# Patient Record
Sex: Female | Born: 1945 | Race: White | Hispanic: No | Smoking: Former smoker
Health system: Southern US, Community
[De-identification: ages and names within clinical notes are randomized; demographics above are authoritative.]

## PROBLEM LIST (undated history)

## (undated) DIAGNOSIS — C801 Malignant (primary) neoplasm, unspecified: Secondary | ICD-10-CM

## (undated) DIAGNOSIS — F0390 Unspecified dementia without behavioral disturbance: Secondary | ICD-10-CM

## (undated) DIAGNOSIS — M1990 Unspecified osteoarthritis, unspecified site: Secondary | ICD-10-CM

## (undated) DIAGNOSIS — J449 Chronic obstructive pulmonary disease, unspecified: Secondary | ICD-10-CM

## (undated) DIAGNOSIS — E785 Hyperlipidemia, unspecified: Secondary | ICD-10-CM

## (undated) DIAGNOSIS — I509 Heart failure, unspecified: Secondary | ICD-10-CM

## (undated) MED FILL — Dexamethasone Sodium Phosphate Inj 100 MG/10ML: INTRAMUSCULAR | Qty: 1 | Status: AC

---

## 2017-08-01 HISTORY — PX: EYE SURGERY: SHX253

## 2020-02-06 DIAGNOSIS — J449 Chronic obstructive pulmonary disease, unspecified: Secondary | ICD-10-CM | POA: Insufficient documentation

## 2020-02-06 DIAGNOSIS — E559 Vitamin D deficiency, unspecified: Secondary | ICD-10-CM | POA: Insufficient documentation

## 2020-02-06 DIAGNOSIS — K056 Periodontal disease, unspecified: Secondary | ICD-10-CM | POA: Insufficient documentation

## 2020-02-06 DIAGNOSIS — R221 Localized swelling, mass and lump, neck: Secondary | ICD-10-CM | POA: Insufficient documentation

## 2020-02-06 DIAGNOSIS — M170 Bilateral primary osteoarthritis of knee: Secondary | ICD-10-CM | POA: Insufficient documentation

## 2020-02-06 DIAGNOSIS — E785 Hyperlipidemia, unspecified: Secondary | ICD-10-CM | POA: Insufficient documentation

## 2020-02-06 DIAGNOSIS — M810 Age-related osteoporosis without current pathological fracture: Secondary | ICD-10-CM | POA: Insufficient documentation

## 2020-07-12 ENCOUNTER — Emergency Department: Payer: Medicare Other

## 2020-07-12 ENCOUNTER — Other Ambulatory Visit: Payer: Self-pay

## 2020-07-12 ENCOUNTER — Encounter: Payer: Self-pay | Admitting: Emergency Medicine

## 2020-07-12 ENCOUNTER — Emergency Department
Admission: EM | Admit: 2020-07-12 | Discharge: 2020-07-12 | Disposition: A | Discharge status: Home / Self Care | Payer: Medicare Other | Attending: Emergency Medicine | Admitting: Emergency Medicine

## 2020-07-12 DIAGNOSIS — J449 Chronic obstructive pulmonary disease, unspecified: Secondary | ICD-10-CM | POA: Insufficient documentation

## 2020-07-12 DIAGNOSIS — F0390 Unspecified dementia without behavioral disturbance: Secondary | ICD-10-CM | POA: Insufficient documentation

## 2020-07-12 DIAGNOSIS — I509 Heart failure, unspecified: Secondary | ICD-10-CM | POA: Insufficient documentation

## 2020-07-12 DIAGNOSIS — D72828 Other elevated white blood cell count: Secondary | ICD-10-CM | POA: Diagnosis not present

## 2020-07-12 DIAGNOSIS — Y92129 Unspecified place in nursing home as the place of occurrence of the external cause: Secondary | ICD-10-CM | POA: Diagnosis not present

## 2020-07-12 DIAGNOSIS — W19XXXA Unspecified fall, initial encounter: Secondary | ICD-10-CM | POA: Insufficient documentation

## 2020-07-12 DIAGNOSIS — R410 Disorientation, unspecified: Secondary | ICD-10-CM | POA: Diagnosis present

## 2020-07-12 HISTORY — DX: Chronic obstructive pulmonary disease, unspecified: J44.9

## 2020-07-12 HISTORY — DX: Unspecified dementia, unspecified severity, without behavioral disturbance, psychotic disturbance, mood disturbance, and anxiety: F03.90

## 2020-07-12 HISTORY — DX: Heart failure, unspecified: I50.9

## 2020-07-12 LAB — CBC WITH DIFFERENTIAL/PLATELET
Abs Immature Granulocytes: 0.23 10*3/uL — ABNORMAL HIGH (ref 0.00–0.07)
Basophils Absolute: 0.1 10*3/uL (ref 0.0–0.1)
Basophils Relative: 0 %
Eosinophils Absolute: 0 10*3/uL (ref 0.0–0.5)
Eosinophils Relative: 0 %
HCT: 48.2 % — ABNORMAL HIGH (ref 36.0–46.0)
Hemoglobin: 15.8 g/dL — ABNORMAL HIGH (ref 12.0–15.0)
Immature Granulocytes: 1 %
Lymphocytes Relative: 9 %
Lymphs Abs: 1.6 10*3/uL (ref 0.7–4.0)
MCH: 30.6 pg (ref 26.0–34.0)
MCHC: 32.8 g/dL (ref 30.0–36.0)
MCV: 93.2 fL (ref 80.0–100.0)
Monocytes Absolute: 1.6 10*3/uL — ABNORMAL HIGH (ref 0.1–1.0)
Monocytes Relative: 9 %
Neutro Abs: 14.5 10*3/uL — ABNORMAL HIGH (ref 1.7–7.7)
Neutrophils Relative %: 81 %
Platelets: 305 10*3/uL (ref 150–400)
RBC: 5.17 MIL/uL — ABNORMAL HIGH (ref 3.87–5.11)
RDW: 12.9 % (ref 11.5–15.5)
WBC: 18.1 10*3/uL — ABNORMAL HIGH (ref 4.0–10.5)
nRBC: 0 % (ref 0.0–0.2)

## 2020-07-12 LAB — URINALYSIS, COMPLETE (UACMP) WITH MICROSCOPIC
Bilirubin Urine: NEGATIVE
Glucose, UA: NEGATIVE mg/dL
Ketones, ur: NEGATIVE mg/dL
Leukocytes,Ua: NEGATIVE
Nitrite: NEGATIVE
Protein, ur: 100 mg/dL — AB
Specific Gravity, Urine: 1.011 (ref 1.005–1.030)
Squamous Epithelial / HPF: NONE SEEN (ref 0–5)
pH: 7 (ref 5.0–8.0)

## 2020-07-12 LAB — BASIC METABOLIC PANEL
Anion gap: 14 (ref 5–15)
BUN: 16 mg/dL (ref 8–23)
CO2: 27 mmol/L (ref 22–32)
Calcium: 11.5 mg/dL — ABNORMAL HIGH (ref 8.9–10.3)
Chloride: 98 mmol/L (ref 98–111)
Creatinine, Ser: 0.76 mg/dL (ref 0.44–1.00)
GFR, Estimated: >60 mL/min (ref >60)
Glucose, Bld: 115 mg/dL — ABNORMAL HIGH (ref 70–99)
Potassium: 3.8 mmol/L (ref 3.5–5.1)
Sodium: 139 mmol/L (ref 135–145)

## 2020-07-12 NOTE — ED Provider Notes (Signed)
[facility] Emergency Department Provider Note ____________________________________________   Event Date/Time   First MD Initiated Contact with Patient 07/12/20 1225     (approximate)  I have reviewed the triage vital signs and the nursing notes.  HISTORY  Chief Complaint Fall   HPI Nancy Berg is a 74 y.o. femalewho presents to the ED for evaluation of fall  Chart review indicates hx dementia, COPD and CHF. Patient resides at a local SNF. Patient presents with son, who is at the bedside, and provides additional history.  Patient had an unwitnessed fall that occurred at her SNF early this morning.  She was noted to ambulate back to her room after breakfast with her walker, and her roommate reportedly found her on the ground next to her walker near their room.  Patient is unable to provide any history due to her demented disoriented status.  Son at the bedside indicates he has no concerns for recent infections or illnesses before this fall.  He reports patient stayed with him for about 6 months prior to going to the SNF, which just happened last week.  No recent antibiotics.  She typically ambulates with a walker.  Past Medical History:  Diagnosis Date  . CHF (congestive heart failure) ([place])   . COPD (chronic obstructive pulmonary disease) ([place])   . Dementia ([place])     There are no problems to display for this patient.   History reviewed. No pertinent surgical history.  Prior to Admission medications   Not on File    Allergies Patient has no known allergies.  No family history on file.  Social History    Review of Systems  Unable to be reliably obtained due to patient's disoriented and demented mental status. ____________________________________________   PHYSICAL EXAM:  VITAL SIGNS: Vitals:   07/12/20 1409 07/12/20 1521  BP: (!) 193/90 (!) 179/75  Pulse: 84 88  Resp: 16 16  Temp: 98 F (36.7 C) 98.1 F (36.7 C)  SpO2:  97% 97%     Constitutional: Alert and pleasantly disoriented. Well appearing and in no acute distress.  Cachectic. Eyes: Conjunctivae are normal. PERRL. EOMI. Head: Atraumatic. Nose: No congestion/rhinnorhea. Mouth/Throat: Mucous membranes are moist.  Oropharynx non-erythematous. Neck: No stridor. No cervical spine tenderness to palpation. Cardiovascular: Normal rate, regular rhythm. Grossly normal heart sounds.  Good peripheral circulation. Respiratory: Normal respiratory effort.  No retractions. Lungs CTAB. Gastrointestinal: Soft , nondistended, nontender to palpation. No CVA tenderness. Musculoskeletal: No lower extremity tenderness nor edema.  No joint effusions. No signs of acute trauma. Inspection of the back reveals no signs of trauma.  No bony step-offs to the spine. Palpation of all 4 extremities without deformity, areas of tenderness or signs of trauma Neurologic:  Normal speech and language. No gross focal neurologic deficits are appreciated.  Cranial nerves II through XII intact 5/5 strength and sensation in all 4 extremities Skin:  Skin is warm, dry and intact. No rash noted. Psychiatric: Mood and affect are normal. Speech and behavior are normal.  ____________________________________________   LABS (all labs ordered are listed, but only abnormal results are displayed)  Labs Reviewed  CBC WITH DIFFERENTIAL/PLATELET - Abnormal; Notable for the following components:      Result Value   WBC 18.1 (*)    RBC 5.17 (*)    Hemoglobin 15.8 (*)    HCT 48.2 (*)    Neutro Abs 14.5 (*)    Monocytes Absolute 1.6 (*)    Abs Immature Granulocytes 0.23 (*)  All other components within normal limits  BASIC METABOLIC PANEL - Abnormal; Notable for the following components:   Glucose, Bld 115 (*)    Calcium 11.5 (*)    All other components within normal limits  URINALYSIS, COMPLETE (UACMP) WITH MICROSCOPIC - Abnormal; Notable for the following components:   Color, Urine YELLOW (*)     APPearance HAZY (*)    Hgb urine dipstick SMALL (*)    Protein, ur 100 (*)    Bacteria, UA RARE (*)    All other components within normal limits   ____________________________________________  RADIOLOGY  ED MD interpretation: CT head reviewed by me without evidence of acute cranial pathology. CT C-spine reviewed by me without evidence of cervical fracture.  Plain film of the chest reviewed by me with elevated left hemidiaphragm with associated mild atelectasis  Official radiology report(s): CT Head Wo Contrast  Result Date: 07/12/2020 CLINICAL DATA:  Unwitnessed witnessed fall.  Dementia EXAM: CT HEAD WITHOUT CONTRAST CT CERVICAL SPINE WITHOUT CONTRAST TECHNIQUE: Multidetector CT imaging of the head and cervical spine was performed following the standard protocol without intravenous contrast. Multiplanar CT image reconstructions of the cervical spine were also generated. COMPARISON:  None. FINDINGS: CT HEAD FINDINGS Brain: No acute intracranial hemorrhage. No focal mass lesion. No CT evidence of acute infarction. No midline shift or mass effect. No hydrocephalus. Basilar cisterns are patent. There are periventricular and subcortical white matter hypodensities. Generalized cortical atrophy. Vascular: No hyperdense vessel or unexpected calcification. Skull: Normal. Negative for fracture or focal lesion. Sinuses/Orbits: Paranasal sinuses and mastoid air cells are clear. Orbits are clear. Other: None. CT CERVICAL SPINE FINDINGS Alignment: Normal alignment of the cervical vertebral bodies. Skull base and vertebrae: Post cervical junction is normal. No acute loss vertebral height and disc height. No vertebral body fracture. 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 Soft tissues and spinal canal: No prevertebral soft tissue swelling. No perispinal or epidural hematoma. Disc levels: There is extensive endplate osteophytosis and joint space narrowing from C4-C7. There is facet fusion from C3-C5  bilaterally. Upper chest: Clear Other: None IMPRESSION: 1. No intracranial trauma. 2. Atrophy and white matter microvascular disease. 3. No cervical spine fracture. 4. Multilevel disc osteophytic disease and facet hypertrophy. No acute findings. Electronically Signed   By: Suzy Bouchard M.D.   On: 07/12/2020 10:25   CT Cervical Spine Wo Contrast  Result Date: 07/12/2020 CLINICAL DATA:  Unwitnessed witnessed fall.  Dementia EXAM: CT HEAD WITHOUT CONTRAST CT CERVICAL SPINE WITHOUT CONTRAST TECHNIQUE: Multidetector CT imaging of the head and cervical spine was performed following the standard protocol without intravenous contrast. Multiplanar CT image reconstructions of the cervical spine were also generated. COMPARISON:  None. FINDINGS: CT HEAD FINDINGS Brain: No acute intracranial hemorrhage. No focal mass lesion. No CT evidence of acute infarction. No midline shift or mass effect. No hydrocephalus. Basilar cisterns are patent. There are periventricular and subcortical white matter hypodensities. Generalized cortical atrophy. Vascular: No hyperdense vessel or unexpected calcification. Skull: Normal. Negative for fracture or focal lesion. Sinuses/Orbits: Paranasal sinuses and mastoid air cells are clear. Orbits are clear. Other: None. CT CERVICAL SPINE FINDINGS Alignment: Normal alignment of the cervical vertebral bodies. Skull base and vertebrae: Post cervical junction is normal. No acute loss vertebral height and disc height. No vertebral body fracture. 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 Soft tissues and spinal canal: No prevertebral soft tissue swelling. No perispinal or epidural hematoma. Disc levels: There is extensive endplate osteophytosis and joint space narrowing from C4-C7. There is facet fusion  from C3-C5 bilaterally. Upper chest: Clear Other: None IMPRESSION: 1. No intracranial trauma. 2. Atrophy and white matter microvascular disease. 3. No cervical spine fracture. 4. Multilevel disc  osteophytic disease and facet hypertrophy. No acute findings. Electronically Signed   By: Suzy Bouchard M.D.   On: 07/12/2020 10:25   DG Chest Portable 1 View  Result Date: 07/12/2020 CLINICAL DATA:  Unwitnessed fall EXAM: PORTABLE CHEST 1 VIEW COMPARISON:  None. FINDINGS: Heart size is not appear enlarged. Atherosclerotic calcification of the aortic knob. Elevation of the left hemidiaphragm with bandlike opacity in the left lung base, likely atelectasis. Right lung is hyperinflated. There are coarsened interstitial markings bilaterally. No pleural effusion or pneumothorax. Degenerative changes of the bilateral shoulders. IMPRESSION: 1. Elevation of the left hemidiaphragm with bandlike opacity in the left lung base, likely atelectasis. 2. Coarsened interstitial markings bilaterally, possibly chronic bronchitic type lung changes. Electronically Signed   By: Davina Poke D.O.   On: 07/12/2020 14:41    ____________________________________________   PROCEDURES and INTERVENTIONS  Procedure(s) performed (including Critical Care):  .1-3 Lead EKG Interpretation Performed by: Vladimir Crofts, MD Authorized by: Vladimir Crofts, MD     Interpretation: normal     ECG rate:  84   ECG rate assessment: normal     Rhythm: sinus rhythm     Ectopy: none     Conduction: normal      Medications - No data to display  ____________________________________________   MDM / ED COURSE  74 year old woman presents from her SNF after unwitnessed fall without evidence of significant injury amenable to return to facility.  Normal vitals on room air.  Exam without evidence of distress, neurovascular deficits or signs of acute trauma.  Blood work shows leukocytosis, the son reports is chronic, and otherwise unremarkable.  She has no evidence of acute infection or sepsis.  Urine without infectious features and CXR without infiltrates, patient has no symptoms of pneumonia.  CT head and neck without evidence of ICH  or fracture, respectively.  Son at the bedside indicates that patient is at her behavioral baseline and I see no evidence of additional pathology.  We discussed return precautions for the ED and patient is medically stable to return to her facility.   Clinical Course as of 07/12/20 1525  Sun Jul 12, 2020  1452 CXR noted with elevated left hemidiaphragm. I returned to the bedside and reassessed the patient, she is not tender to her LUQ and has no overlying signs of trauma.  No clinical signs of diaphragmatic rupture or traumatic pathology.  I again listen to her lungs and do not note crackles to suggest pneumonia.  I discussed benign work-up with son at the bedside, and he is relieved.  He reports he is happy to drive her home and does not need EMS transport. [DS]    Clinical Course User Index [DS] Vladimir Crofts, MD    ____________________________________________   FINAL CLINICAL IMPRESSION(S) / ED DIAGNOSES  Final diagnoses:  Fall, initial encounter  Other elevated white blood cell (WBC) count  Disorientation     ED Discharge Orders    None       Surah Pelley   Note:  This document was prepared using Dragon voice recognition software and may include unintentional dictation errors.   Vladimir Crofts, MD 07/12/20 [phone]

## 2020-07-12 NOTE — ED Triage Notes (Signed)
Pt in via EMS from [facility] for a fall. Fall was unwitnessed. Pt was found on her back on the floor with the walker in front of her. Pt c/o pain to her lower back. Pt recently moved to the facility so they are not familiar with her history. Pt does have hx of dementia.

## 2020-07-12 NOTE — ED Triage Notes (Signed)
Pt to ED via [facility] from [facility] for unwitnessed fall. Pt was found in the floor on her back. Pt states that she does not remember what happened but states that she fell backwards. Pt unable to answer questions due to dementia.

## 2020-07-12 NOTE — Discharge Instructions (Addendum)
Use Tylenol for pain and fevers.  Up to 1000 mg per dose, up to 4 times per day.  Do not take more than 4000 mg of Tylenol/acetaminophen within 24 hours..  As we discussed, Nancy Berg has a elevated white blood count of unknown source. No other signs of infection, pneumonia or UTI.  Please continue your normal care and return to the ED with any additional falls or concerns for acute illness.

## 2021-07-21 ENCOUNTER — Other Ambulatory Visit: Payer: Self-pay | Admitting: Specialist

## 2021-07-21 DIAGNOSIS — R059 Cough, unspecified: Secondary | ICD-10-CM

## 2021-07-21 DIAGNOSIS — J986 Disorders of diaphragm: Secondary | ICD-10-CM

## 2021-07-21 DIAGNOSIS — R0609 Other forms of dyspnea: Secondary | ICD-10-CM

## 2021-08-17 ENCOUNTER — Ambulatory Visit
Admission: RE | Admit: 2021-08-17 | Discharge: 2021-08-17 | Disposition: A | Discharge status: Home / Self Care | Payer: Medicare HMO | Source: Ambulatory Visit | Attending: Specialist | Admitting: Specialist

## 2021-08-17 DIAGNOSIS — R0609 Other forms of dyspnea: Secondary | ICD-10-CM | POA: Insufficient documentation

## 2021-08-17 DIAGNOSIS — J986 Disorders of diaphragm: Secondary | ICD-10-CM

## 2021-08-17 DIAGNOSIS — R059 Cough, unspecified: Secondary | ICD-10-CM

## 2021-08-20 ENCOUNTER — Other Ambulatory Visit: Payer: Self-pay | Admitting: Specialist

## 2021-08-20 DIAGNOSIS — R911 Solitary pulmonary nodule: Secondary | ICD-10-CM

## 2021-08-20 DIAGNOSIS — N63 Unspecified lump in unspecified breast: Secondary | ICD-10-CM

## 2021-08-23 ENCOUNTER — Other Ambulatory Visit: Payer: Self-pay | Admitting: Specialist

## 2021-08-24 ENCOUNTER — Other Ambulatory Visit: Payer: Self-pay | Admitting: Specialist

## 2021-08-24 DIAGNOSIS — N63 Unspecified lump in unspecified breast: Secondary | ICD-10-CM

## 2021-09-28 ENCOUNTER — Ambulatory Visit
Admission: RE | Admit: 2021-09-28 | Discharge: 2021-09-28 | Disposition: A | Discharge status: Home / Self Care | Payer: Medicare HMO | Source: Ambulatory Visit | Attending: Specialist | Admitting: Specialist

## 2021-09-28 ENCOUNTER — Other Ambulatory Visit: Payer: Self-pay

## 2021-09-28 DIAGNOSIS — N63 Unspecified lump in unspecified breast: Secondary | ICD-10-CM

## 2021-09-28 DIAGNOSIS — R921 Mammographic calcification found on diagnostic imaging of breast: Secondary | ICD-10-CM | POA: Diagnosis not present

## 2021-09-28 DIAGNOSIS — N6315 Unspecified lump in the right breast, overlapping quadrants: Secondary | ICD-10-CM | POA: Diagnosis not present

## 2021-09-28 DIAGNOSIS — N6310 Unspecified lump in the right breast, unspecified quadrant: Secondary | ICD-10-CM | POA: Diagnosis present

## 2021-09-30 ENCOUNTER — Other Ambulatory Visit: Payer: Self-pay | Admitting: Specialist

## 2021-09-30 DIAGNOSIS — R928 Other abnormal and inconclusive findings on diagnostic imaging of breast: Secondary | ICD-10-CM

## 2021-09-30 DIAGNOSIS — R599 Enlarged lymph nodes, unspecified: Secondary | ICD-10-CM

## 2021-09-30 DIAGNOSIS — N63 Unspecified lump in unspecified breast: Secondary | ICD-10-CM

## 2021-10-01 ENCOUNTER — Other Ambulatory Visit: Payer: Self-pay | Admitting: Specialist

## 2021-10-01 DIAGNOSIS — N63 Unspecified lump in unspecified breast: Secondary | ICD-10-CM

## 2021-10-01 DIAGNOSIS — R928 Other abnormal and inconclusive findings on diagnostic imaging of breast: Secondary | ICD-10-CM

## 2021-10-14 ENCOUNTER — Ambulatory Visit
Admission: RE | Admit: 2021-10-14 | Discharge: 2021-10-14 | Disposition: A | Discharge status: Home / Self Care | Payer: Medicare HMO | Source: Ambulatory Visit | Attending: Specialist | Admitting: Specialist

## 2021-10-14 ENCOUNTER — Other Ambulatory Visit: Payer: Self-pay

## 2021-10-14 DIAGNOSIS — R928 Other abnormal and inconclusive findings on diagnostic imaging of breast: Secondary | ICD-10-CM | POA: Diagnosis present

## 2021-10-14 DIAGNOSIS — C50811 Malignant neoplasm of overlapping sites of right female breast: Secondary | ICD-10-CM | POA: Diagnosis not present

## 2021-10-14 DIAGNOSIS — N6310 Unspecified lump in the right breast, unspecified quadrant: Secondary | ICD-10-CM | POA: Diagnosis present

## 2021-10-14 DIAGNOSIS — R599 Enlarged lymph nodes, unspecified: Secondary | ICD-10-CM | POA: Diagnosis present

## 2021-10-15 ENCOUNTER — Encounter: Payer: Self-pay | Admitting: *Deleted

## 2021-10-15 NOTE — Progress Notes (Signed)
Received message from Nancy Sniff, RN from [facility] that patient's son Nancy Berg, had been notified of her positive breast biopsy, and is aware navigation will call to coordinate her surgical consultation.   Spoke to [employer].  Confirmed that since his mom had seen Dr Peyton Najjar, I would work on getting her a follow up appointment with him on Monday when his office is open.  He is agreealble to the plan. ?

## 2021-10-18 ENCOUNTER — Other Ambulatory Visit: Payer: Self-pay | Admitting: Anatomic Pathology & Clinical Pathology

## 2021-10-18 ENCOUNTER — Encounter: Payer: Self-pay | Admitting: *Deleted

## 2021-10-18 DIAGNOSIS — C50911 Malignant neoplasm of unspecified site of right female breast: Secondary | ICD-10-CM

## 2021-10-18 NOTE — Progress Notes (Signed)
Patient has been scheduled to see Dr. Peyton Najjar for surgical consult on 10/22/21 @ 9:30 and Dr. Janese Banks for medical oncology on 10/27/21 @ 2:30.  Her son Quillian Quince has been informed of her appointments.  Educational literature can be given at the time of her appointment. ?

## 2021-10-19 ENCOUNTER — Encounter: Payer: Self-pay | Admitting: *Deleted

## 2021-10-19 ENCOUNTER — Other Ambulatory Visit: Payer: Self-pay | Admitting: *Deleted

## 2021-10-27 ENCOUNTER — Inpatient Hospital Stay: Payer: Medicare HMO

## 2021-10-27 ENCOUNTER — Encounter: Payer: Self-pay | Admitting: Oncology

## 2021-10-27 ENCOUNTER — Encounter: Payer: Self-pay | Admitting: *Deleted

## 2021-10-27 ENCOUNTER — Inpatient Hospital Stay: Payer: Medicare HMO | Attending: Oncology | Admitting: Oncology

## 2021-10-27 VITALS — BP 133/74 | HR 93 | Temp 96.9°F | Resp 14 | Wt 93.4 lb

## 2021-10-27 DIAGNOSIS — Z171 Estrogen receptor negative status [ER-]: Secondary | ICD-10-CM

## 2021-10-27 DIAGNOSIS — C50411 Malignant neoplasm of upper-outer quadrant of right female breast: Secondary | ICD-10-CM | POA: Diagnosis present

## 2021-10-27 DIAGNOSIS — R54 Age-related physical debility: Secondary | ICD-10-CM | POA: Diagnosis not present

## 2021-10-27 DIAGNOSIS — F0390 Unspecified dementia without behavioral disturbance: Secondary | ICD-10-CM | POA: Insufficient documentation

## 2021-10-27 DIAGNOSIS — Z7189 Other specified counseling: Secondary | ICD-10-CM

## 2021-10-27 DIAGNOSIS — I509 Heart failure, unspecified: Secondary | ICD-10-CM | POA: Diagnosis not present

## 2021-10-27 DIAGNOSIS — C773 Secondary and unspecified malignant neoplasm of axilla and upper limb lymph nodes: Secondary | ICD-10-CM | POA: Insufficient documentation

## 2021-10-27 DIAGNOSIS — J449 Chronic obstructive pulmonary disease, unspecified: Secondary | ICD-10-CM | POA: Diagnosis not present

## 2021-10-27 DIAGNOSIS — Z79899 Other long term (current) drug therapy: Secondary | ICD-10-CM | POA: Insufficient documentation

## 2021-10-27 NOTE — Progress Notes (Signed)
? ?Hematology/Oncology Consult note ?[facility] ?[phone] [phone] ? ?Patient Care Team: ?Verl Blalock, NP as PCP - General (Adult Health Nurse Practitioner) ?Sindy Guadeloupe, MD as Consulting Physician (Hematology)  ? ?Name of the patient: Nancy Berg  ?[id number]  ?1945-08-30  ? ? ?Reason for referral-new diagnosis of breast cancer ?  ?Referring physician-Dr. Raul Del ? ?Date of visit: 10/27/21 ? ? ?History of presenting illness- Patient is a 76 year old female who underwent CT chest without contrast to evaluate symptoms of shortness of breath.  That incidentally showed right axillary adenopathy and soft tissue in the superior right breast.  This was followed by diagnostic right breast mammogram and ultrasound which showed a 2.3 x 1.6 x 1.2 cm irregular hypoechoic mass in the right breast at the 12 o'clock position 1 cm from the nipple.  Enlarged right axillary lymph node 1.8 cm.  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 ? ?Patient has met with Dr. Ferrel Logan who is awaiting clearance from pulmonary before considering surgery.  At baseline patient lives in an assisted living in [place].  Her son lives in [place].  She is on 24 hours home oxygen.  [name] states that she has declined significantly in the last 1 year.  Prior to that she was living independently and was independent of her ADLs.  She was hospitalized about a year ago and was eventually diagnosed with COPD and has been on oxygen since then. ? ? ?ECOG PS- 2 ? ?Pain scale- 0 ? ? ?Review of systems- Review of Systems  ?Constitutional:  Positive for malaise/fatigue. Negative for chills, fever and weight loss.  ?HENT:  Negative  for congestion, ear discharge and nosebleeds.   ?Eyes:  Negative for blurred vision.  ?Respiratory:  Positive for shortness of breath. Negative for cough, hemoptysis, sputum production and wheezing.   ?Cardiovascular:  Negative for chest pain, palpitations, orthopnea and claudication.  ?Gastrointestinal:  Negative for abdominal pain, blood in stool, constipation, diarrhea, heartburn, melena, nausea and vomiting.  ?Genitourinary:  Negative for dysuria, flank pain, frequency, hematuria and urgency.  ?Musculoskeletal:  Negative for back pain, joint pain and myalgias.  ?Skin:  Negative for rash.  ?Neurological:  Negative for dizziness, tingling, focal weakness, seizures, weakness and headaches.  ?Endo/Heme/Allergies:  Does not bruise/bleed easily.  ?Psychiatric/Behavioral:  Negative for depression and suicidal ideas. The patient does not have insomnia.   ? ?Allergies  ?Allergen Reactions  ? Penicillins Rash and Other (See Comments)  ? ? ?Patient Active Problem List  ? Diagnosis Date Noted  ? Malignant neoplasm of upper-outer quadrant of right breast in female, estrogen receptor negative ([place]) 10/27/2021  ? Goals of care, counseling/discussion 10/27/2021  ? ? ? ?Past Medical History:  ?Diagnosis Date  ? CHF (congestive heart failure) ([place])   ? COPD (chronic obstructive pulmonary disease) ([place])   ? Dementia ([place])   ? ? ? ?Past Surgical History:  ?Procedure Laterality Date  ? BREAST BIOPSY Right 10/14/2021  ? rt 12:00 [place] bx venus clip path pending  ? BREAST BIOPSY Right 10/14/2021  ? rt axilla hydromarker path pending  ? ? ?Social History  ? ?Socioeconomic History  ? Marital status: Divorced  ?  Spouse name: Not on file  ? Number of children:  Not on file  ? Years of education: Not on file  ? Highest education level: Not on file  ?Occupational History  ? Not on file  ?Tobacco Use  ? Smoking status: Not on file  ? Smokeless tobacco: Not on file  ?Substance and Sexual Activity  ? Alcohol use: Not on file  ? Drug use: Not on  file  ? Sexual activity: Not on file  ?Other Topics Concern  ? Not on file  ?Social History Narrative  ? Not on file  ? ?Social Determinants of Health  ? ?Financial Resource Strain: Not on file  ?Food Insecurity: Not on file  ?Transportation Needs: Not on file  ?Physical Activity: Not on file  ?Stress: Not on file  ?Social Connections: Not on file  ?Intimate Partner Violence: Not on file  ? ?  ?No family history on file. ? ? ?Current Outpatient Medications:  ?  acetaminophen (TYLENOL) 325 MG tablet, Take 650 mg by mouth every 6 (six) hours as needed., Disp: , Rfl:  ?  albuterol (VENTOLIN HFA) 108 (90 Base) MCG/ACT inhaler, Inhale into the lungs., Disp: , Rfl:  ?  alendronate (FOSAMAX) 70 MG tablet, Take 70 mg by mouth once a week., Disp: , Rfl:  ?  atorvastatin (LIPITOR) 20 MG tablet, Take 20 mg by mouth daily., Disp: , Rfl:  ?  calcium carbonate (TUMS EX) 750 MG chewable tablet, Chew 1 tablet by mouth in the morning and at bedtime., Disp: , Rfl:  ?  latanoprost (XALATAN) 0.005 % ophthalmic solution, SMARTSIG:In Eye(s), Disp: , Rfl:  ?  metoprolol succinate (TOPROL-XL) 25 MG 24 hr tablet, Take 25 mg by mouth 2 (two) times daily., Disp: , Rfl:  ?  potassium chloride SA (KLOR-CON M) 20 MEQ tablet, Take 1 tablet by mouth daily., Disp: , Rfl:  ?  aspirin 325 MG tablet, Take by mouth. (Patient not taking: Reported on 10/27/2021), Disp: , Rfl:  ?  INCRUSE ELLIPTA 62.5 MCG/ACT AEPB, Inhale 1 puff into the lungs daily., Disp: , Rfl:  ?  lisinopril (ZESTRIL) 20 MG tablet, Take 1 tablet by mouth daily. (Patient not taking: Reported on 10/27/2021), Disp: , Rfl:  ? ? ?Physical exam:  ?Vitals:  ? 10/27/21 1434  ?BP: 133/74  ?Pulse: 93  ?Resp: 14  ?Temp: (!) 96.9 ?F (36.1 ?C)  ?SpO2: 93%  ?Weight: 93 lb 6.4 oz (42.4 kg)  ? ?Physical Exam ?Constitutional:   ?   Comments: Thin elderly frail woman sitting in a wheelchair on home oxygen  ?Cardiovascular:  ?   Rate and Rhythm: Normal rate and regular rhythm.  ?   Heart sounds: Normal  heart sounds.  ?Pulmonary:  ?   Effort: Pulmonary effort is normal.  ?   Comments: Breath sounds decreased bilaterally diffusely ?Abdominal:  ?   General: Bowel sounds are normal.  ?   Palpations: Abdomen is soft.  ?Skin: ?   General: Skin is warm and dry.  ?Neurological:  ?   Mental Status: She is alert and oriented to person, place, and time.  ?  ?Breast exam: Somewhat limited as patient is unable to sit on the examination table.  There is a palpable 3 cm right breast mass at the 12 o'clock position along with palpable right axillary adenopathy. ? ? ? ?  Latest Ref Rng & Units 07/12/2020  ? 12:56 PM  ?CMP  ?Glucose 70 - 99 mg/dL 115    ?BUN 8 - 23 mg/dL 16    ?Creatinine 0.44 -  1.00 mg/dL 0.76    ?Sodium 135 - 145 mmol/L 139    ?Potassium 3.5 - 5.1 mmol/L 3.8    ?Chloride 98 - 111 mmol/L 98    ?CO2 22 - 32 mmol/L 27    ?Calcium 8.9 - 10.3 mg/dL 11.5    ? ? ?  Latest Ref Rng & Units 07/12/2020  ? 12:56 PM  ?CBC  ?WBC 4.0 - 10.5 K/uL 18.1    ?Hemoglobin 12.0 - 15.0 g/dL 15.8    ?Hematocrit 36.0 - 46.0 % 48.2    ?Platelets 150 - 400 K/uL 305    ? ? ?No images are attached to the encounter. ? ?US BREAST LTD UNI RIGHT INC AXILLA ? ?Result Date: 09/28/2021 ?CLINICAL DATA:  Patient presents for evaluation of mass identified on recent chest CT in the right breast. EXAM: DIGITAL DIAGNOSTIC BILATERAL MAMMOGRAM WITH TOMOSYNTHESIS AND CAD; ULTRASOUND RIGHT BREAST LIMITED TECHNIQUE: Bilateral digital diagnostic mammography and breast tomosynthesis was performed. The images were evaluated with computer-aided detection.; Targeted ultrasound examination of the right breast was performed COMPARISON:  Previous exam(s). ACR Breast Density Category b: There are scattered areas of fibroglandular density. FINDINGS: Within the superior right breast middle depth there is a lobular mass. There are adjacent surrounding coarse heterogeneous calcifications measuring up to 2.4 x 1.2 cm predominately along the lateral margin of the mass. No  additional concerning masses, calcifications or distortion identified within either breast. On physical exam, there is a palpable mass within the superior right breast. Targeted ultrasound is performed, sho

## 2021-10-27 NOTE — Progress Notes (Signed)
Met patient and her son Quillian Quince today at her initial medical oncology consult.  Gave patient breast cancer educational literature, "My Breast Cancer Treatment Handbook" by Josephine Igo, RN.   Patient is being scheduled for a PET scan and then discuss final plan of care.  Patient and son encouraged to cal myself or Webb Silversmith for any questions or needs. ?

## 2021-10-28 ENCOUNTER — Encounter: Payer: Self-pay | Admitting: Oncology

## 2021-10-28 LAB — SURGICAL PATHOLOGY

## 2021-11-03 ENCOUNTER — Encounter: Payer: Self-pay | Admitting: Oncology

## 2021-11-04 ENCOUNTER — Telehealth: Payer: Self-pay | Admitting: *Deleted

## 2021-11-04 ENCOUNTER — Encounter: Payer: Self-pay | Admitting: *Deleted

## 2021-11-04 ENCOUNTER — Ambulatory Visit
Admission: RE | Admit: 2021-11-04 | Discharge: 2021-11-04 | Disposition: A | Discharge status: Home / Self Care | Payer: Self-pay | Source: Ambulatory Visit | Attending: Oncology | Admitting: Oncology

## 2021-11-04 DIAGNOSIS — Z171 Estrogen receptor negative status [ER-]: Secondary | ICD-10-CM

## 2021-11-04 NOTE — Telephone Encounter (Signed)
Called son and let him know that dr Janese Banks got the pet scan results and are sending it to tumor conference [place] where there is group of doctors and discuss case and come up with a plan. Dr. Janese Banks wants to see pt 4/17 at 23 am and son and pt ok with date and time ?

## 2021-11-04 NOTE — Progress Notes (Addendum)
Per Dr. Janese Banks, need to get outside images from [facility] uploaded for further review and possible coordination of lung biopsy. Pt added for tumor board discussion next week. Once images received will leave message with Dr. Gust Brooms office regarding lung biopsy.  ?

## 2021-11-11 ENCOUNTER — Other Ambulatory Visit: Payer: Medicare HMO

## 2021-11-11 ENCOUNTER — Encounter: Payer: Self-pay | Admitting: *Deleted

## 2021-11-11 ENCOUNTER — Telehealth: Payer: Self-pay | Admitting: Oncology

## 2021-11-11 DIAGNOSIS — R911 Solitary pulmonary nodule: Secondary | ICD-10-CM

## 2021-11-11 MED ORDER — DOXYCYCLINE HYCLATE 100 MG PO TABS: 100.0000 mg | ORAL_TABLET | Freq: Two times a day (BID) | ORAL | 0 refills | Status: DC

## 2021-11-11 NOTE — Progress Notes (Signed)
Tumor Board Documentation ? ?Bessy Reaney was presented by Dr Janese Banks at our Tumor Board on 11/11/2021, which included representatives from medical oncology, pathology, surgical, pharmacy, pulmonology, genetics, radiation oncology, navigation, research, internal medicine, palliative care, radiology. ? ?Dalaya currently presents as a new patient, for [facility], for new positive pathology with history of the following treatments: active survellience. ? ?Additionally, we reviewed previous medical and familial history, history of present illness, and recent lab results along with all available histopathologic and imaging studies. The tumor board considered available treatment options and made the following recommendations: ?Additional screening (Repeat non contrst CT scan) ?Give antibiotics Azithromycin r Doxycycline ? ?The following procedures/referrals were also placed: No orders of the defined types were placed in this encounter. ? ? ?Clinical Trial Status: not discussed  ? ?Staging used: To be determined ?AJCC Staging: ?T: 2 ?N: 1 ?  ?Group: Invasive Mammary Carcinoma of RUOQ Breast triple negative ? ? ?National site-specific guidelines   were discussed with respect to the case. ? ?Tumor board is a meeting of clinicians from various specialty areas who evaluate and discuss patients for whom a multidisciplinary approach is being considered. Final determinations in the plan of care are those of the provider(s). The responsibility for follow up of recommendations given during tumor board is that of the provider.  ? ?Today?s extended care, comprehensive team conference, Simcha was not present for the discussion and was not examined.  ? ?Multidisciplinary Tumor Board is a multidisciplinary case peer review process.  Decisions discussed in the Multidisciplinary Tumor Board reflect the opinions of the specialists present at the conference without having examined the patient.  Ultimately, treatment and diagnostic decisions rest with  the primary provider(s) and the patient. ? ?

## 2021-11-11 NOTE — Progress Notes (Signed)
Pt scheduled for CT on 4/24 and has another appt for CT on 4/27 scheduled by Dr. Gust Brooms office. Per Dr. Janese Banks, will need CT scan on 4/24 and can cancel CT on 4/27. Dr. Gust Brooms office made aware and agreed with cancelling CT on 4/27. Will forward CT report on 4/24 to Dr. Raul Del once available.  ?

## 2021-11-11 NOTE — Telephone Encounter (Signed)
Spoke with patient's son to let him know CT has been scheduled for 4/24. He confirmed day and time. Also let him know that we would be contacting Dr. Gust Brooms office ([facility] to let them know we have ordered this scan as their office also has one scheduled at the end of that week.  ?

## 2021-11-11 NOTE — Progress Notes (Signed)
Case presented at tumor board to evaluate lung nodule seen on recent PET scan. Lung nodule may represent inflammatory process. Per Dr. Janese Banks, pt needs to start doxycycline '100mg'$  BID x 7 days and have repeat chest CT next week. Phone call made to pt's son, Quillian Quince, to inform of recommendations. States that pt lives in [place] and wishes for her to be scheduled for scan at [facility]. Informed that prescription has been sent into pharmacy and pt should start within the next few days. Instructed that he will be notified with her appt for repeat CT scan. All questions answered during call. Informed to keep appt with Dr. Janese Banks was scheduled next week. Quillian Quince verbalized understanding.  ?

## 2021-11-15 ENCOUNTER — Encounter: Payer: Self-pay | Admitting: Oncology

## 2021-11-15 ENCOUNTER — Encounter (HOSPITAL_COMMUNITY): Payer: Medicare HMO

## 2021-11-15 ENCOUNTER — Inpatient Hospital Stay: Payer: Medicare HMO | Attending: Oncology | Admitting: Oncology

## 2021-11-15 VITALS — BP 105/58 | HR 79 | Temp 99.1°F | Resp 14 | Wt 93.2 lb

## 2021-11-15 DIAGNOSIS — Z171 Estrogen receptor negative status [ER-]: Secondary | ICD-10-CM | POA: Insufficient documentation

## 2021-11-15 DIAGNOSIS — Z803 Family history of malignant neoplasm of breast: Secondary | ICD-10-CM | POA: Diagnosis not present

## 2021-11-15 DIAGNOSIS — M1990 Unspecified osteoarthritis, unspecified site: Secondary | ICD-10-CM | POA: Insufficient documentation

## 2021-11-15 DIAGNOSIS — R911 Solitary pulmonary nodule: Secondary | ICD-10-CM | POA: Insufficient documentation

## 2021-11-15 DIAGNOSIS — D518 Other vitamin B12 deficiency anemias: Secondary | ICD-10-CM | POA: Insufficient documentation

## 2021-11-15 DIAGNOSIS — Z87891 Personal history of nicotine dependence: Secondary | ICD-10-CM | POA: Insufficient documentation

## 2021-11-15 DIAGNOSIS — C50411 Malignant neoplasm of upper-outer quadrant of right female breast: Secondary | ICD-10-CM | POA: Diagnosis present

## 2021-11-15 DIAGNOSIS — Z7189 Other specified counseling: Secondary | ICD-10-CM

## 2021-11-15 DIAGNOSIS — Z79899 Other long term (current) drug therapy: Secondary | ICD-10-CM | POA: Diagnosis not present

## 2021-11-15 DIAGNOSIS — Z801 Family history of malignant neoplasm of trachea, bronchus and lung: Secondary | ICD-10-CM | POA: Diagnosis not present

## 2021-11-15 NOTE — Progress Notes (Signed)
? ? ? ?Hematology/Oncology Consult note ?[place]  ?[phone] [phone] ? ?Patient Care Team: ?Verl Blalock, NP as PCP - General (Adult Health Nurse Practitioner) ?Sindy Guadeloupe, MD as Consulting Physician (Hematology)  ? ?Name of the patient: Nancy Berg  ?[id number]  ?[birth year]  ? ?Date of visit: 11/15/21 ? 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 ?Chief complaint/ Reason for visit-discuss PET CT scan results and further management ? ?Heme/Onc history: Patient is a 76 year old female who underwent CT chest without contrast to evaluate symptoms of shortness of breath.  That incidentally showed right axillary adenopathy and soft tissue in the superior right breast.  This was followed by diagnostic right breast mammogram and ultrasound which showed a 2.3 x 1.6 x 1.2 cm irregular hypoechoic mass in the right breast at the 12 o'clock position 1 cm from the nipple.  Enlarged right axillary lymph node 1.8 cm.  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 ?  ?Patient has met with Dr. Ferrel Logan who is awaiting clearance from pulmonary before considering surgery.  At baseline patient lives in an assisted living in [place].  Her son lives in [place].  She is on 24 hours home oxygen.  [name] states that she has declined significantly in the last 1 year.  Prior to that she was living independently and was independent of her ADLs.  She was hospitalized about a year ago and was eventually diagnosed with COPD and has been on oxygen since then. ? ?PET CT scan shows FDG avid spiculated nodule with central  cavitation in the left upper lobe.  Size an SUV of this nodule was not quantified on the PET scan.  Partially calcified markedly avid right breast lesion with markedly avid right axillary lymph node metastases. ? ?Interval history-no new complaints as compared to last visit.  She reports chronic fatigue and exertional shortness of breath ? ?ECOG PS- 2-3 ?Pain scale- 0 ? ? ?Review of systems- Review of Systems  ?Constitutional:  Positive for malaise/fatigue. Negative for chills, fever and weight loss.  ?HENT:  Negative for congestion, ear discharge and nosebleeds.   ?Eyes:  Negative for blurred vision.  ?Respiratory:  Positive for shortness of breath. Negative for cough, hemoptysis, sputum production and wheezing.   ?Cardiovascular:  Negative for chest pain, palpitations, orthopnea and claudication.  ?Gastrointestinal:  Negative for abdominal pain, blood in stool, constipation, diarrhea, heartburn, melena, nausea and vomiting.  ?Genitourinary:  Negative for dysuria, flank pain, frequency, hematuria and urgency.  ?Musculoskeletal:  Negative for back pain, joint pain and myalgias.  ?Skin:  Negative for rash.  ?Neurological:  Negative for dizziness, tingling, focal weakness, seizures, weakness and headaches.  ?Endo/Heme/Allergies:  Does not bruise/bleed easily.  ?Psychiatric/Behavioral:  Negative for depression and suicidal ideas. The patient does not have insomnia.    ? ? ?Allergies  ?Allergen Reactions  ? Penicillins Rash and Other (See Comments)  ? ? ? ?Past Medical History:  ?Diagnosis Date  ? CHF (congestive heart failure) ([place])   ? COPD (chronic obstructive pulmonary disease) ([place])   ? Dementia ([place])   ? ? ? ?Past Surgical History:  ?Procedure Laterality Date  ? BREAST BIOPSY Right 10/14/2021  ?  rt 12:00 [place] bx venus clip path pending  ? BREAST BIOPSY Right 10/14/2021  ? rt axilla hydromarker path pending  ? ? ?Social History  ? ?Socioeconomic History  ? Marital status: Divorced  ?  Spouse name: Not on file  ?  Number of children: Not on file  ? Years of education: Not on file  ? Highest education level: Not on file  ?Occupational History  ? Not on file  ?Tobacco Use  ? Smoking status: Former  ?  Types: Cigarettes  ? Smokeless tobacco: Never  ?Substance and Sexual Activity  ? Alcohol use: Not Currently  ? Drug use: Not Currently  ? Sexual activity: Not Currently  ?Other Topics Concern  ? Not on file  ?Social History Narrative  ? Not on file  ? ?Social Determinants of Health  ? ?Financial Resource Strain: Not on file  ?Food Insecurity: Not on file  ?Transportation Needs: Not on file  ?Physical Activity: Not on file  ?Stress: Not on file  ?Social Connections: Not on file  ?Intimate Partner Violence: Not on file  ? ? ?Family History  ?Problem Relation Age of Onset  ? Breast cancer Mother   ? Lung cancer Sister   ? ? ? ?Current Outpatient Medications:  ?  acetaminophen (TYLENOL) 325 MG tablet, Take 650 mg by mouth every 6 (six) hours as needed., Disp: , Rfl:  ?  alendronate (FOSAMAX) 70 MG tablet, Take 70 mg by mouth once a week., Disp: , Rfl:  ?  atorvastatin (LIPITOR) 20 MG tablet, Take 20 mg by mouth daily., Disp: , Rfl:  ?  calcium carbonate (TUMS EX) 750 MG chewable tablet, Chew 1 tablet by mouth in the morning and at bedtime., Disp: , Rfl:  ?  doxycycline (VIBRA-TABS) 100 MG tablet, Take 1 tablet (100 mg total) by mouth 2 (two) times daily., Disp: 14 tablet, Rfl: 0 ?  INCRUSE ELLIPTA 62.5 MCG/ACT AEPB, Inhale 1 puff into the lungs daily., Disp: , Rfl:  ?  latanoprost (XALATAN) 0.005 % ophthalmic solution, SMARTSIG:In Eye(s), Disp: , Rfl:  ?  LUMIGAN 0.01 % SOLN, SMARTSIG:In Eye(s), Disp: , Rfl:  ?  metoprolol succinate (TOPROL-XL) 25 MG 24 hr tablet, Take 25 mg by mouth 2 (two) times daily., Disp: , Rfl:  ?  potassium chloride SA (KLOR-CON M) 20 MEQ tablet, Take 1 tablet by mouth daily., Disp: , Rfl:  ?  albuterol (VENTOLIN HFA) 108 (90 Base) MCG/ACT inhaler, Inhale into the lungs. (Patient not taking: Reported on  11/15/2021), Disp: , Rfl:  ?  aspirin 325 MG tablet, Take by mouth. (Patient not taking: Reported on 10/27/2021), Disp: , Rfl:  ?  budesonide (PULMICORT) 0.5 MG/2ML nebulizer solution, Inhale into the lungs. (Patient not taking: Reported on 11/15/2021), Disp: , Rfl:  ?  lisinopril (ZESTRIL) 20 MG tablet, Take 1 tablet by mouth daily. (Patient not taking: Reported on 10/27/2021), Disp: , Rfl:  ? ?Physical exam:  ?Vitals:  ? 11/15/21 1106  ?BP: (!) 105/58  ?Pulse: 79  ?Resp: 14  ?Temp: 99.1 ?F (37.3 ?C)  ?SpO2: 98%  ?Weight: 93 lb 3.2 oz (42.3 kg)  ? ?Physical Exam ?Constitutional:   ?   Comments: Elderly woman sitting in a wheelchair.  She is on home oxygen  ?Cardiovascular:  ?   Rate and Rhythm: Normal rate and regular rhythm.  ?   Heart sounds: Normal heart sounds.  ?Pulmonary:  ?   Comments: Effort of breathing increased ?Skin: ?   General: Skin is warm  and dry.  ?Neurological:  ?   Mental Status: She is alert and oriented to person, place, and time.  ?  ? ? ?  Latest Ref Rng & Units 07/12/2020  ? 12:56 PM  ?CMP  ?Glucose 70 - 99 mg/dL 115    ?BUN 8 - 23 mg/dL 16    ?Creatinine 0.44 - 1.00 mg/dL 0.76    ?Sodium 135 - 145 mmol/L 139    ?Potassium 3.5 - 5.1 mmol/L 3.8    ?Chloride 98 - 111 mmol/L 98    ?CO2 22 - 32 mmol/L 27    ?Calcium 8.9 - 10.3 mg/dL 11.5    ? ? ?  Latest Ref Rng & Units 07/12/2020  ? 12:56 PM  ?CBC  ?WBC 4.0 - 10.5 K/uL 18.1    ?Hemoglobin 12.0 - 15.0 g/dL 15.8    ?Hematocrit 36.0 - 46.0 % 48.2    ?Platelets 150 - 400 K/uL 305    ? ? ? ?Assessment and plan- Patient is a 76 y.o. female with clinical prognostic stage IIIb invasive mammary carcinoma of the right breast apocrine variety T2 N1 M0 triple negative here to discuss further management ? ?I have reviewed PET CT scan images independently and we also discussed her imaging findings at tumor board.  Patient noted to have a small 5 mm left upper lobe lung nodule on CT scan in January 2023.  This appears to be more enlarged presently on the PET  scan.  Imaging characteristics are not conclusive for malignancy and other than this nodule there is no other finding of metastatic disease.  It is therefore unclear if we are dealing with not malignancy finding in the

## 2021-11-18 ENCOUNTER — Telehealth: Payer: Self-pay

## 2021-11-18 ENCOUNTER — Telehealth (INDEPENDENT_AMBULATORY_CARE_PROVIDER_SITE_OTHER): Payer: Self-pay

## 2021-11-18 ENCOUNTER — Other Ambulatory Visit: Payer: Self-pay

## 2021-11-18 DIAGNOSIS — Z95828 Presence of other vascular implants and grafts: Secondary | ICD-10-CM

## 2021-11-18 NOTE — Telephone Encounter (Signed)
Spoke to pts son Quillian Quince in regards to pts decideing bwtween tx or surgery. Son states that pt perfers to have surgery. However, Dr. Silvestre Gunner said "that's a no go." Son stated that Dr. Janese Banks gave an option to proceed with chemo until pt is able ot have surgery. Pt has decided to have chemo until she is able to have surgery and both pt and son agree. He stated to go ahead and schedule pt for tx. Will reach out to [facility] in order to have scheduled.  ?

## 2021-11-18 NOTE — Telephone Encounter (Signed)
Spoke with patient's son and she is scheduled with Dr. Lucky Cowboy on 11/24/21 with a 12:15 pm arrival time to the MM. Pre-procedure instructions were discussed and will be mailed. ?

## 2021-11-19 ENCOUNTER — Telehealth: Payer: Self-pay | Admitting: Oncology

## 2021-11-19 ENCOUNTER — Other Ambulatory Visit: Payer: Self-pay

## 2021-11-19 DIAGNOSIS — C50411 Malignant neoplasm of upper-outer quadrant of right female breast: Secondary | ICD-10-CM

## 2021-11-19 NOTE — Telephone Encounter (Signed)
Left VM with patient's son to confirm patient's CT scan on 4/24 and her Chemo class on 4/25.  ? ?

## 2021-11-22 ENCOUNTER — Telehealth: Payer: Self-pay | Admitting: *Deleted

## 2021-11-22 ENCOUNTER — Ambulatory Visit
Admission: RE | Admit: 2021-11-22 | Discharge: 2021-11-22 | Disposition: A | Discharge status: Home / Self Care | Payer: Medicare HMO | Source: Ambulatory Visit | Attending: Oncology | Admitting: Oncology

## 2021-11-22 ENCOUNTER — Ambulatory Visit: Payer: Self-pay | Admitting: General Surgery

## 2021-11-22 DIAGNOSIS — R911 Solitary pulmonary nodule: Secondary | ICD-10-CM | POA: Diagnosis not present

## 2021-11-22 NOTE — Telephone Encounter (Signed)
Dr. Deniece Ree office called today about a port placement for the patient for chemo.  We had set it up for vascular to get the port placed however the surgeon had already seen her and was making arrangements for the port.  I called over to vascular and canceled our appointment with them and patient has been given the instructions from Dr. Deniece Ree office about getting the port on Thursday of this week ?

## 2021-11-23 ENCOUNTER — Other Ambulatory Visit: Payer: Medicare HMO

## 2021-11-24 ENCOUNTER — Ambulatory Visit: Admission: RE | Admit: 2021-11-24 | Payer: Medicare HMO | Source: Home / Self Care | Admitting: Vascular Surgery

## 2021-11-24 ENCOUNTER — Other Ambulatory Visit
Admission: RE | Admit: 2021-11-24 | Discharge: 2021-11-24 | Disposition: A | Discharge status: Home / Self Care | Payer: Medicare HMO | Source: Ambulatory Visit | Attending: General Surgery | Admitting: General Surgery

## 2021-11-24 ENCOUNTER — Encounter: Admission: RE | Payer: Self-pay | Source: Home / Self Care

## 2021-11-24 VITALS — Ht 62.0 in | Wt 93.0 lb

## 2021-11-24 DIAGNOSIS — J42 Unspecified chronic bronchitis: Secondary | ICD-10-CM

## 2021-11-24 DIAGNOSIS — I10 Essential (primary) hypertension: Secondary | ICD-10-CM

## 2021-11-24 DIAGNOSIS — J449 Chronic obstructive pulmonary disease, unspecified: Secondary | ICD-10-CM

## 2021-11-24 DIAGNOSIS — Z171 Estrogen receptor negative status [ER-]: Secondary | ICD-10-CM

## 2021-11-24 DIAGNOSIS — C50411 Malignant neoplasm of upper-outer quadrant of right female breast: Secondary | ICD-10-CM

## 2021-11-24 DIAGNOSIS — Z01812 Encounter for preprocedural laboratory examination: Secondary | ICD-10-CM

## 2021-11-24 HISTORY — DX: Unspecified osteoarthritis, unspecified site: M19.90

## 2021-11-24 HISTORY — DX: Hyperlipidemia, unspecified: E78.5

## 2021-11-24 HISTORY — DX: Malignant (primary) neoplasm, unspecified: C80.1

## 2021-11-24 SURGERY — PORTA CATH INSERTION
Anesthesia: Moderate Sedation

## 2021-11-24 MED ORDER — CHLORHEXIDINE GLUCONATE 0.12 % MT SOLN
15.0000 mL | Freq: Once | OROMUCOSAL | Status: AC
  Administered 2021-11-25: 15 mL via OROMUCOSAL

## 2021-11-24 MED ORDER — LACTATED RINGERS IV SOLN: INTRAVENOUS | Status: DC

## 2021-11-24 MED ORDER — VANCOMYCIN HCL IN DEXTROSE 1-5 GM/200ML-% IV SOLN: 1000.0000 mg | Freq: Once | INTRAVENOUS | Status: AC

## 2021-11-24 MED ORDER — FAMOTIDINE 20 MG PO TABS
20.0000 mg | ORAL_TABLET | Freq: Once | ORAL | Status: AC
  Administered 2021-11-25: 20 mg via ORAL

## 2021-11-24 MED ORDER — ORAL CARE MOUTH RINSE: 15.0000 mL | Freq: Once | OROMUCOSAL | Status: AC

## 2021-11-24 NOTE — Progress Notes (Addendum)
?Perioperative Services ?Pre-Admission/Anesthesia Testing ? ?  ?Date: 11/24/21 ? ?Name: Nancy Berg ?MRN:   [id number] ? ?Re: Plans for vascular access device placement for definitive oncologic treatment (chemotherapy) ? ?Planned Surgical Procedure(s):  ? ? Case: [id number] Date/Time: 11/25/21 0715  ? Procedure: INSERTION PORT-A-CATH (Chest)  ? Anesthesia type: Monitor Anesthesia Care  ? Pre-op diagnosis: C50.911, C77.3 RIGHT breast cancer metastasized to axillary lymph node  ? Location: [facility] OR ROOM 08 / [facility] ORS FOR ANESTHESIA GROUP  ? Surgeons: Herbert Pun, MD  ? ?Clinical Notes:  ?Patient scheduled for the above procedure on 11/25/2021 with Dr. Herbert Pun, MD.  Patient with significant pulmonary history, therefore she was referred to anesthesia APP for review and clearance prior to proceeding with planned surgical procedure. ? ?Patient underwent breast biopsy on 10/14/2021 that revealed invasive mammary carcinoma within the RIGHT breast. RIGHT axillary lymph node biopsy (+) for macro-metastatic mammary carcinoma; IHC testing performed. Current clinical staging: stage IIIB (cT2, cN1, cM0, G3, ER-, PR-, HER2/neu -). Initial plans were for patient to undergo lumpectomy + axillary node resection, with plans for post-resection adjuvant chemotherapy. Patient also with 5 mm LUL pulmonary nodule on CT. At the present point, unsure if this area represents a primary bronchogenic carcinoma versus a finding of benign (inflammatory) etiology. Current state of health prevents biopsy of pulmonary nodule. Oncology reviewed case with general surgeon; asked for definitive diagnosis of pulmonary nodule prior to pursuing any sort of surgery.  ? ?In preparation for her procedure, patient was seen by her primary pulmonary medicine provider for presurgical clearance.  Pulmonary function testing was performed as follows: ? ?FVC was 1.16 Liters, 43% of predicted ?FEV1 was 0.67 Liters, 32% of predicted ?FEV1 ratio was  57.18%, 74% of predicted ?FEF 25-75% Liters per second was 0.34, 18% of predicted ? ?Per pulmonary medicine Raul Del, MD), "holding on preoperative clearance for RIGHT breast lumpectomy and axillary node resection.  Based on her spirometry, patient is at HIGH risk get stuck on the vent.  Plan is to broaden her coverage and see if there is any further reversibility prior to proceeding.  Family is aware of the gravity of her poor lung function". Dr. Raul Del advised that any degree of optimization for this patient would take at least 1 month, and likely longer.  ? ?Following consultation with pulmonary medicine, plans for management of patient's known malignancy tailored to allow for immediate intervention. Oncology feels as if delaying initiation of treatment to allow for potential pulmonary optimization will only open the door for progression of her biopsy proven breast malignancy. Frail status and ECOG limit ability to provide full dose chemotherapy. Given triple negative tumor, she is unfortunately not a candidate for endocrine therapy.  Plans discussed at length with family and decision made to initiate neoadjuvant chemotherapy with plans for consideration of repeat CT imaging to assess for improvement of pulmonary finding. Ultimately, pending pulmonary optimization, surgical resection of breast tumor remains part of definitve plan of care for this patient. Patient started on course of oral doxycycline on 11/11/2021.  ? ?Given patient's need for systemic antineoplastic chemotherapy, plans were for patient to have vascular access device (port-a-cath) placed.  Surgeon's office scheduled case to be done under general anesthesia, which was felt to be previously not an option for this patient due to her poor lung function.  Given the uncertain nature of the patient's malignancy (triple negative [place]), patient needs to have this procedure done in order to proceed with treatment for her malignancy.  Reached out  to  anesthesia to discuss; case details reviewed with Erenest Rasher, MD.  I inquired as to the feasibility of performing case under MAC +/- regional anesthesia with attending anesthesiologist.  This was felt to be an appropriate viable option for anesthesia for the planned case.  Reached out to primary attending surgeon Windell Moment, MD) who is comfortable with proceeding with planned surgical intervention under a MAC anesthetic course.  ? ?Presurgical call was completed by PAT RN.  Case details and discussion between anesthesia, pulmonary medicine, and surgery were discussed with patient's son.  It was stressed to the patient's son that the patient was deemed to be at increased risk (HIGH) for this procedure given her poor pulmonary function.  Son verbalized understanding and noted that they would like to proceed with vascular access device placement in preparation for antineoplastic chemotherapy administration. ? ?Impression and Plan:  ?Nancy Berg has been referred for pre-anesthesia review and clearance prior to her undergoing the planned anesthetic and procedural courses. Available labs, pertinent testing, and imaging results were personally reviewed by me. Pulmonary medicine not comfortable with signing off on this patient to receive general anesthesia due to poor pulmonary function; concern for postoperative ventilator dependency. With that being said, patient has stage IIIB (cT2, cN1, cM0, G3, ER-, PR-, HER2/neu -) and needs vascular access in order to pursue definitive treatment. Surgery and anesthesia services have both agreed to proceed with planned surgical intervention under MAC +/- regional anesthesia. ? ?Based on clinical review performed today (11/24/21), barring any significant acute changes in the patient's overall condition, it is anticipated that she will be able to proceed with the planned surgical intervention. Any acute changes in clinical condition may necessitate her procedure being postponed  and/or cancelled.  Case has been extensively reviewed with patient's son by surgery, anesthesia, and pulmonary medicine.  Son has a firm understanding that patient is at [place] risk due to his mother's lung function, however wishes to proceed.  [name] aware that they will meet with anesthesia team (MD and/or CRNA) on this day of her procedure for preoperative evaluation/assessment. This will also allow time for any additional questions that they may have prior to her undergoing any type of anesthesia.  ? ?Pre-surgical instructions were reviewed with the patient/son during her PAT appointment and questions were fielded by PAT clinical staff. Patient was advised that if any questions or concerns arise prior to her procedure then she should return a call to PAT and/or her surgeon's office to discuss. ? ?Honor Loh, MSN, APRN, FNP-C, CEN ?[place]  ?Peri-operative Services Nurse Practitioner ?Phone: [phone] ?11/24/21 2:26 PM ? ?NOTE: This note has been prepared using [employer]. Despite my best ability to proofread, there is always the potential that unintentional transcriptional errors may still occur from this process. ?

## 2021-11-24 NOTE — Patient Instructions (Addendum)
?Your procedure is scheduled on: Thursday November 25, 2021, please arrive at 6 am ?Report to Day Surgery inside [facility] 2nd floor, stop by admissions desk before getting on elevator.  ? ? ?Remember: Instructions that are not followed completely may result in serious medical risk,  ?up to and including death, or upon the discretion of your surgeon and anesthesiologist your  ?surgery may need to be rescheduled.  ? ?  _X__ 1. Do not eat food or drink fluids after midnight the night before your procedure. ?                No chewing gum or hard candies.  ? ?__X__2.  On the morning of surgery brush your teeth with toothpaste and water, you ?               may rinse your mouth with mouthwash if you wish.  Do not swallow any toothpaste or mouthwash. ?   ? _X__ 3.  No Alcohol for 24 hours before or after surgery. ? ? _X__ 4.  Do Not Smoke or use e-cigarettes For 24 Hours Prior to Your Surgery. ?                Do not use any chewable tobacco products for at least 6 hours prior to ?                Surgery. ? ?_X__  5.  Do not use any recreational drugs (marijuana, cocaine, heroin, ecstasy, MDMA or other) ?               For at least one week prior to your surgery.  Combination of these drugs with anesthesia ?               May have life threatening results. ? ?____  6.  Bring all medications with you on the day of surgery if instructed.  ? ?__X__  7.  Notify your doctor if there is any change in your medical condition  ?    (cold, fever, infections). ?    ?Do not wear jewelry, make-up, hairpins, clips or nail polish. ?Do not wear lotions, powders, or perfumes. You may wear deodorant. ?Do not shave 48 hours prior to surgery. Men may shave face and neck. ?Do not bring valuables to the hospital.   ? ?[facility] is not responsible for any belongings or valuables. ? ?Contacts, dentures or bridgework may not be worn into surgery. ?Leave your suitcase in the car. After surgery it may be brought to your  room. ?For patients admitted to the hospital, discharge time is determined by your ?treatment team. ?  ?Patients discharged the day of surgery will not be allowed to drive home.   ?Make arrangements for someone to be with you for the first 24 hours of your ?Same Day Discharge. ? ? ?__X__ Take these medicines the morning of surgery with A SIP OF WATER:  ? ? 1. metoprolol succinate (TOPROL-XL) 25 MG ? 2.  ? 3.  ? 4. ? 5. ? 6. ? ?____ Fleet Enema (as directed)  ? ?__X__ Use Antibacterial Soap or CHG soap the night before as directed ? ?____ Use Benzoyl Peroxide Gel as instructed ? ?__X__ Use inhalers on the day of surgery ? INCRUSE ELLIPTA 62.5 MCG/ACT AEPB ? budesonide (PULMICORT) 0.5 MG/2ML nebulizer  ? ?____ Stop metformin 2 days prior to surgery   ? ?____ Take 1/2 of usual insulin dose the night before surgery. No insulin  the morning ?         of surgery.  ? ?____ Call your PCP, cardiologist, or Pulmonologist if taking Coumadin/Plavix/aspirin and ask when to stop before your surgery.  ? ?__X__ One Week prior to surgery- Stop Anti-inflammatories such as Ibuprofen, Aleve, Advil, Motrin, naproxen, meloxicam (MOBIC), diclofenac, etodolac, ketorolac, Toradol, Daypro, piroxicam, Goody's or BC powders. OK TO USE TYLENOL IF NEEDED ?  ?__X__ Stop supplements until after surgery.   ? ?__X__ Bring Oxygen 2L as needed to the hospital.  ? ? ?If you have any questions regarding your pre-procedure instructions,  ?Please call Pre-admit Testing at [phone] ?

## 2021-11-25 ENCOUNTER — Other Ambulatory Visit: Payer: Self-pay

## 2021-11-25 ENCOUNTER — Ambulatory Visit: Payer: Medicare HMO

## 2021-11-25 ENCOUNTER — Encounter: Payer: Self-pay | Admitting: General Surgery

## 2021-11-25 ENCOUNTER — Ambulatory Visit: Payer: Medicare HMO | Admitting: Urgent Care

## 2021-11-25 ENCOUNTER — Encounter
Admission: RE | Disposition: A | Discharge status: Home / Self Care | Payer: Self-pay | Source: Home / Self Care | Attending: General Surgery

## 2021-11-25 ENCOUNTER — Ambulatory Visit
Admission: RE | Admit: 2021-11-25 | Discharge: 2021-11-25 | Disposition: A | Discharge status: Home / Self Care | Payer: Medicare HMO | Attending: General Surgery | Admitting: General Surgery

## 2021-11-25 DIAGNOSIS — Z01812 Encounter for preprocedural laboratory examination: Secondary | ICD-10-CM

## 2021-11-25 DIAGNOSIS — Z171 Estrogen receptor negative status [ER-]: Secondary | ICD-10-CM | POA: Diagnosis not present

## 2021-11-25 DIAGNOSIS — Z9981 Dependence on supplemental oxygen: Secondary | ICD-10-CM | POA: Insufficient documentation

## 2021-11-25 DIAGNOSIS — J449 Chronic obstructive pulmonary disease, unspecified: Secondary | ICD-10-CM | POA: Insufficient documentation

## 2021-11-25 DIAGNOSIS — C50911 Malignant neoplasm of unspecified site of right female breast: Secondary | ICD-10-CM | POA: Insufficient documentation

## 2021-11-25 DIAGNOSIS — Z87891 Personal history of nicotine dependence: Secondary | ICD-10-CM | POA: Diagnosis not present

## 2021-11-25 DIAGNOSIS — I10 Essential (primary) hypertension: Secondary | ICD-10-CM

## 2021-11-25 DIAGNOSIS — I509 Heart failure, unspecified: Secondary | ICD-10-CM | POA: Insufficient documentation

## 2021-11-25 HISTORY — PX: PORTACATH PLACEMENT: SHX2246

## 2021-11-25 LAB — BASIC METABOLIC PANEL
Anion gap: 7 (ref 5–15)
BUN: 19 mg/dL (ref 8–23)
CO2: 28 mmol/L (ref 22–32)
Calcium: 9.2 mg/dL (ref 8.9–10.3)
Chloride: 103 mmol/L (ref 98–111)
Creatinine, Ser: 0.42 mg/dL — ABNORMAL LOW (ref 0.44–1.00)
GFR, Estimated: >60 mL/min (ref >60)
Glucose, Bld: 94 mg/dL (ref 70–99)
Potassium: 3.7 mmol/L (ref 3.5–5.1)
Sodium: 138 mmol/L (ref 135–145)

## 2021-11-25 LAB — CBC
HCT: 42.8 % (ref 36.0–46.0)
Hemoglobin: 13.4 g/dL (ref 12.0–15.0)
MCH: 29.1 pg (ref 26.0–34.0)
MCHC: 31.3 g/dL (ref 30.0–36.0)
MCV: 93 fL (ref 80.0–100.0)
Platelets: 248 10*3/uL (ref 150–400)
RBC: 4.6 MIL/uL (ref 3.87–5.11)
RDW: 14.4 % (ref 11.5–15.5)
WBC: 11.4 10*3/uL — ABNORMAL HIGH (ref 4.0–10.5)
nRBC: 0 % (ref 0.0–0.2)

## 2021-11-25 SURGERY — INSERTION, TUNNELED CENTRAL VENOUS DEVICE, WITH PORT
Anesthesia: Monitor Anesthesia Care | Site: Chest

## 2021-11-25 MED ORDER — PROPOFOL 500 MG/50ML IV EMUL
INTRAVENOUS | Status: DC | PRN
  Administered 2021-11-25: 50 ug/kg/min via INTRAVENOUS

## 2021-11-25 MED ORDER — DEXMEDETOMIDINE (PRECEDEX) IN NS 20 MCG/5ML (4 MCG/ML) IV SYRINGE
PREFILLED_SYRINGE | INTRAVENOUS | Status: DC | PRN
  Administered 2021-11-25: 4 ug via INTRAVENOUS

## 2021-11-25 MED ORDER — SODIUM CHLORIDE (PF) 0.9 % IJ SOLN
INTRAMUSCULAR | Status: AC
  Filled 2021-11-25: qty 50

## 2021-11-25 MED ORDER — PROPOFOL 10 MG/ML IV BOLUS
INTRAVENOUS | Status: DC | PRN
  Administered 2021-11-25: 20 mg via INTRAVENOUS

## 2021-11-25 MED ORDER — VANCOMYCIN HCL IN DEXTROSE 1-5 GM/200ML-% IV SOLN
INTRAVENOUS | Status: AC
  Administered 2021-11-25: 1000 mg via INTRAVENOUS
  Filled 2021-11-25: qty 200

## 2021-11-25 MED ORDER — CHLORHEXIDINE GLUCONATE 0.12 % MT SOLN
OROMUCOSAL | Status: AC
  Filled 2021-11-25: qty 15

## 2021-11-25 MED ORDER — 0.9 % SODIUM CHLORIDE (POUR BTL) OPTIME
TOPICAL | Status: DC | PRN
  Administered 2021-11-25: 100 mL

## 2021-11-25 MED ORDER — FENTANYL CITRATE (PF) 100 MCG/2ML IJ SOLN
INTRAMUSCULAR | Status: AC
  Filled 2021-11-25: qty 2

## 2021-11-25 MED ORDER — PROPOFOL 500 MG/50ML IV EMUL
INTRAVENOUS | Status: AC
  Filled 2021-11-25: qty 50

## 2021-11-25 MED ORDER — SODIUM CHLORIDE (PF) 0.9 % IJ SOLN
INTRAMUSCULAR | Status: DC | PRN
  Administered 2021-11-25: 20 mL

## 2021-11-25 MED ORDER — SODIUM CHLORIDE 0.9 % IV SOLN
INTRAVENOUS | Status: DC | PRN
  Administered 2021-11-25: 9 mL via INTRAMUSCULAR

## 2021-11-25 MED ORDER — HYDROCODONE-ACETAMINOPHEN 5-325 MG PO TABS: 1.0000 | ORAL_TABLET | ORAL | 0 refills | Status: AC | PRN

## 2021-11-25 MED ORDER — LIDOCAINE HCL (CARDIAC) PF 100 MG/5ML IV SOSY
PREFILLED_SYRINGE | INTRAVENOUS | Status: DC | PRN
  Administered 2021-11-25: 40 mg via INTRAVENOUS

## 2021-11-25 MED ORDER — FAMOTIDINE 20 MG PO TABS
ORAL_TABLET | ORAL | Status: AC
  Filled 2021-11-25: qty 1

## 2021-11-25 MED ORDER — HEPARIN SODIUM (PORCINE) 5000 UNIT/ML IJ SOLN
INTRAMUSCULAR | Status: AC
  Filled 2021-11-25: qty 1

## 2021-11-25 MED ORDER — BUPIVACAINE-EPINEPHRINE (PF) 0.25% -1:200000 IJ SOLN
INTRAMUSCULAR | Status: AC
  Filled 2021-11-25: qty 30

## 2021-11-25 MED ORDER — BUPIVACAINE-EPINEPHRINE (PF) 0.25% -1:200000 IJ SOLN
INTRAMUSCULAR | Status: DC | PRN
  Administered 2021-11-25: 16 mL via PERINEURAL

## 2021-11-25 SURGICAL SUPPLY — 39 items
ADH SKN CLS APL DERMABOND .7 (GAUZE/BANDAGES/DRESSINGS) ×1
APL PRP STRL LF DISP 70% ISPRP (MISCELLANEOUS) ×1
BAG DECANTER FOR FLEXI CONT (MISCELLANEOUS) ×2 IMPLANT
BLADE SURG 11 STRL SS SAFETY (MISCELLANEOUS) ×2 IMPLANT
BLADE SURG SZ11 CARB STEEL (BLADE) ×2 IMPLANT
BOOT SUTURE AID YELLOW STND (SUTURE) ×2 IMPLANT
CHLORAPREP W/TINT 26 (MISCELLANEOUS) ×2 IMPLANT
COVER LIGHT HANDLE STERIS (MISCELLANEOUS) ×4 IMPLANT
COVER PROBE FLX POLY STRL (MISCELLANEOUS) ×1 IMPLANT
DERMABOND ADVANCED (GAUZE/BANDAGES/DRESSINGS) ×1
DERMABOND ADVANCED .7 DNX12 (GAUZE/BANDAGES/DRESSINGS) ×1 IMPLANT
DRAPE C-ARM XRAY 36X54 (DRAPES) ×2 IMPLANT
ELECT REM PT RETURN 9FT ADLT (ELECTROSURGICAL) ×2
ELECTRODE REM PT RTRN 9FT ADLT (ELECTROSURGICAL) ×1 IMPLANT
GAUZE 4X4 16PLY ~~LOC~~+RFID DBL (SPONGE) ×2 IMPLANT
GLOVE BIO SURGEON STRL SZ 6.5 (GLOVE) ×2 IMPLANT
GLOVE BIOGEL PI IND STRL 6.5 (GLOVE) ×1 IMPLANT
GLOVE BIOGEL PI INDICATOR 6.5 (GLOVE) ×1
GOWN STRL REUS W/ TWL LRG LVL3 (GOWN DISPOSABLE) ×3 IMPLANT
GOWN STRL REUS W/TWL LRG LVL3 (GOWN DISPOSABLE) ×6
IV NS 500ML (IV SOLUTION) ×2
IV NS 500ML BAXH (IV SOLUTION) ×1 IMPLANT
KIT PORT POWER 8FR ISP CVUE (Port) ×2 IMPLANT
KIT TURNOVER KIT A (KITS) ×2 IMPLANT
LABEL OR SOLS (LABEL) ×2 IMPLANT
MANIFOLD NEPTUNE II (INSTRUMENTS) ×2 IMPLANT
NDL FILTER BLUNT 18X1 1/2 (NEEDLE) ×1 IMPLANT
NEEDLE FILTER BLUNT 18X 1/2SAF (NEEDLE) ×1
NEEDLE FILTER BLUNT 18X1 1/2 (NEEDLE) ×1 IMPLANT
PACK PORT-A-CATH (MISCELLANEOUS) ×2 IMPLANT
SUT MNCRL AB 4-0 PS2 18 (SUTURE) ×2 IMPLANT
SUT PROLENE 2 0 FS (SUTURE) ×2 IMPLANT
SUT VIC AB 2-0 SH 27 (SUTURE) ×2
SUT VIC AB 2-0 SH 27XBRD (SUTURE) ×1 IMPLANT
SUT VIC AB 3-0 SH 27 (SUTURE) ×2
SUT VIC AB 3-0 SH 27X BRD (SUTURE) ×1 IMPLANT
SYR 10ML LL (SYRINGE) ×4 IMPLANT
SYR 3ML LL SCALE MARK (SYRINGE) ×2 IMPLANT
WATER STERILE IRR 500ML POUR (IV SOLUTION) ×2 IMPLANT

## 2021-11-25 NOTE — Anesthesia Preprocedure Evaluation (Signed)
Anesthesia Evaluation  ?Patient identified by MRN, date of birth, ID band ?Patient awake ? ? ? ?Reviewed: ?Allergy & Precautions, NPO status , Patient's Chart, lab work & pertinent test results ? ?History of Anesthesia Complications ?Negative for: history of anesthetic complications ? ?Airway ?Mallampati: II ? ?TM Distance: >3 FB ?Neck ROM: full ? ? ? Dental ? ?(+) Teeth Intact ?  ?Pulmonary ?neg pulmonary ROS, COPD,  COPD inhaler, former smoker,  ?  ?Pulmonary exam normal ? ?+ decreased breath sounds ? ? ? ? ? Cardiovascular ?Exercise Tolerance: Poor ?+CHF  ?negative cardio ROS ?Normal cardiovascular exam ?Rhythm:Regular  ? ?  ?Neuro/Psych ?Dementia negative neurological ROS ? negative psych ROS  ? GI/Hepatic ?negative GI ROS, Neg liver ROS,   ?Endo/Other  ?negative endocrine ROS ? Renal/GU ?negative Renal ROS  ? ?  ?Musculoskeletal ? ? Abdominal ?Normal abdominal exam  (+)   ?Peds ?negative pediatric ROS ?(+)  Hematology ?negative hematology ROS ?(+) Blood dyscrasia, anemia ,   ?Anesthesia Other Findings ?Past Medical History: ?No date: Arthritis ?No date: Cancer [facility]) ?No date: CHF (congestive heart failure) ([place]) ?No date: COPD (chronic obstructive pulmonary disease) ([place]) ?No date: Dementia [facility]) ?No date: Hyperlipidemia ? ?Past Surgical History: ?10/14/2021: BREAST BIOPSY; Right ?    Comment:  rt 12:00 [place] bx venus clip path pending ?10/14/2021: BREAST BIOPSY; Right ?    Comment:  rt axilla hydromarker path pending ?2019: EYE SURGERY; Bilateral ?    Comment:  cataract ? ? ? ? Reproductive/Obstetrics ?negative OB ROS ? ?  ? ? ? ? ? ? ? ? ? ? ? ? ? ?  ?  ? ? ? ? ? ? ? ? ?Anesthesia Physical ?Anesthesia Plan ? ?ASA: 3 ? ?Anesthesia Plan: MAC  ? ?Post-op Pain Management:   ? ?Induction: Intravenous ? ?PONV Risk Score and Plan:  ? ?Airway Management Planned: Natural Airway and Nasal Cannula ? ?Additional Equipment:  ? ?Intra-op Plan:  ? ?Post-operative Plan:  ? ?Informed Consent: I  have reviewed the patients History and Physical, chart, labs and discussed the procedure including the risks, benefits and alternatives for the proposed anesthesia with the patient or authorized representative who has indicated his/her understanding and acceptance.  ? ? ? ? ? ?Plan Discussed with: CRNA and Surgeon ? ?Anesthesia Plan Comments:   ? ? ? ? ? ? ?Anesthesia Quick Evaluation ? ?

## 2021-11-25 NOTE — Anesthesia Postprocedure Evaluation (Signed)
Anesthesia Post Note ? ?Patient: Nancy Berg ? ?Procedure(s) Performed: INSERTION PORT-A-CATH (Chest) ? ?Patient location during evaluation: PACU ?Anesthesia Type: MAC ?Level of consciousness: awake ?Pain management: pain level controlled ?Vital Signs Assessment: post-procedure vital signs reviewed and stable ?Respiratory status: spontaneous breathing and respiratory function stable ?Cardiovascular status: stable ?Anesthetic complications: no ? ? ?No notable events documented. ? ? ?Last Vitals:  ?Vitals:  ? 11/25/21 0913 11/25/21 0928  ?BP: 126/73   ?Pulse: 71 70  ?Resp: 18   ?Temp: (!) 36.3 ?C   ?SpO2: 94% 93%  ?  ?Last Pain:  ?Vitals:  ? 11/25/21 0913  ?TempSrc: Temporal  ?PainSc: 0-No pain  ? ? ?  ?  ?  ?  ?  ?  ? ?VAN STAVEREN,Sheketa Ende ? ? ? ? ?

## 2021-11-25 NOTE — Op Note (Signed)
SURGICAL PROCEDURE REPORT ? ?DATE OF PROCEDURE: 11/25/2021  ? ?SURGEON: Dr. Windell Moment  ? ?ANESTHESIA: Local with light IV sedation  ? ?PRE-OPERATIVE DIAGNOSIS: Advanced breast cancer requiring durable central venous access for chemotherapy  ? ?POST-OPERATIVE DIAGNOSIS: Same ? ?PROCEDURE(S):  ?1.) Percutaneous access of Left internal jugular vein under ultrasound guidance 2.) Insertion of tunneled Left internal jugular central venous catheter with subcutaneous port ? ?INTRAOPERATIVE FINDINGS: Patent easily compressible left internal jugular vein with appropriate respiratory variations and well-secured tunneled central venous catheter with subcutaneous port at completion of the procedure ? ?ESTIMATED BLOOD LOSS: Minimal (<20 mL)  ? ?SPECIMENS: None  ? ?IMPLANTS: 9F tunneled Bard PowerPort central venous catheter with subcutaneous port ? ?DRAINS: None  ? ?COMPLICATIONS: None apparent  ? ?CONDITION AT COMPLETION: Hemodynamically stable, awake  ? ?DISPOSITION: PACU  ? ?INDICATION(S) FOR PROCEDURE:  ?Patient is a 76 y.o. female who presented with advanced breast cancer requiring durable central venous access for chemotherapy. All risks, benefits, and alternatives to above elective procedures were discussed with the patient, who elected to proceed, and informed consent was accordingly obtained at that time. ? ?DETAILS OF PROCEDURE:  ?Patient was brought to the operative suite and appropriately identified. In Trendelenburg position, Left IJ venous access site was prepped and draped in the usual sterile fashion, and following a brief timeout, percutaneous Left IJ venous access was obtained under ultrasound guidance using Seldinger technique, by which local anesthetic was injected over the Left IJ vein, and access needle was inserted under direct ultrasound visualization into the Left IJ vein, through which soft guidewire was advanced, over which access needle was withdrawn. Guidewire was secured, attention was directed  to injection of local anesthetic along the planned tunnel site, 2-3 cm transverse Left chest incision was made and confirmed to accommodate the subcutaneous port, and flushed catheter was tunneled retrograde from the port site over the Left chest to the Left IJ access site with the attached port well-secured to the catheter and within the subcutaneous pocket. Insertion sheath was advanced over the guidewire, which was withdrawn along with the insertion sheath dilator. The catheter was introduced through the sheath and left on the [place] junction under fluoro guidance and catheter cut to desire lenght. Catheter connected to port and fixed to the pocket on two side to avoid twisting. Port was confirmed to withdraw blood and flush easily, after which concentrated heparin was instilled into the port and catheter. Dermis at the subcutaneous pocket was re-approximated using buried interrupted 3-0 Vicryl suture, and 4-0 Monocryl suture was used to re-approximate skin at the insertion and subcutaneous port sites in running subcuticular fashion for the subcutaneous port and buried interrupted fashion for the insertion site. Skin was cleaned, dried, and sterile skin glue was applied. Patient was then safely transferred to PACU for a chest x-ray. ?Ultrasound images and Fluoroscopy guidance images are available in Epic.  ? ?

## 2021-11-25 NOTE — H&P (Signed)
History of Present Illness ?Nancy Berg is a 76 y.o. 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 ? ?Patient has history of CHF, COPD on home oxygen.  Upon tumor board report to the patient severe medical comorbidities she is not a candidate for neoadjuvant chemotherapy.  It was decided to try to proceed with upfront surgery with modified radical mastectomy.  Upon evaluation by pulmonology patient was found with severe obstruction and was not cleared for modified radical mastectomy. ? ?Due to her aggressive breast cancer, triple negative, medical oncology decided to start low-dose chemotherapy to see if she tolerates.  The patient and her son agree. ? ?I was consulted back for Port-A-Cath placement. ? ?Patient presents today to the OR for placement of Chemo-Port.   ? ?Past Medical History ?Past Medical History:  ?Diagnosis Date  ? Arthritis   ? Cancer [facility])   ? CHF (congestive heart failure) ([place])   ? COPD (chronic obstructive pulmonary disease) ([place])   ? Dementia ([place])   ? Hyperlipidemia   ?  ?  ?Past Surgical History:  ?Procedure Laterality Date  ? BREAST BIOPSY Right 10/14/2021  ? rt 12:00 [place] bx venus clip path pending  ? BREAST BIOPSY Right 10/14/2021  ? rt axilla hydromarker path pending  ? EYE SURGERY Bilateral 2019  ? cataract  ? ? ?Allergies  ?Allergen Reactions  ? Penicillins Rash and Other (See Comments)  ? ? ?Current Facility-Administered Medications  ?Medication Dose Route Frequency Provider Last Rate Last Admin  ? chlorhexidine (PERIDEX) 0.12 % solution 15 mL  15 mL Mouth/Throat Once Darrin Nipper, MD      ? Or  ? MEDLINE mouth rinse  15 mL Mouth Rinse Once Darrin Nipper, MD      ? famotidine (PEPCID) tablet 20 mg  20 mg Oral Once Karen Kitchens, NP      ? lactated ringers infusion   Intravenous Continuous Darrin Nipper, MD      ? vancomycin (VANCOCIN) IVPB 1000 mg/200 mL premix  1,000 mg Intravenous Once Herbert Pun, MD      ? ? ?Family  History ?Family History  ?Problem Relation Age of Onset  ? Breast cancer Mother   ? Lung cancer Sister   ?  ?  ?Social History ?Social History  ? ?Tobacco Use  ? Smoking status: Former  ?  Types: Cigarettes  ?  Quit date: 10/2019  ?  Years since quitting: 2.0  ? Smokeless tobacco: Never  ?Substance Use Topics  ? Alcohol use: Not Currently  ? Drug use: Not Currently  ?  ?  ? ?ROS ?Full ROS of systems performed and is otherwise negative there than what is stated in the HPI ? ?Physical Exam ?Blood pressure 126/70, pulse 73, temperature 98 ?F (36.7 ?C), temperature source Oral, resp. rate 16, SpO2 97 %. ? ?CONSTITUTIONAL: Alert, oriented x3 ?EYES: Pupils equal, round, and reactive to light, Sclera non-icteric. ?EARS, NOSE, MOUTH AND THROAT: The oropharynx is clear. Oral mucosa is pink and moist. Hearing is intact to voice.  ?NECK: Trachea is midline, and there is no jugular venous distension. Thyroid is without palpable abnormalities. ?LYMPH NODES:  Lymph nodes in the neck are not enlarged. ?RESPIRATORY:  Lungs are clear, and breath sounds are equal bilaterally. Normal respiratory effort without pathologic use of accessory muscles. ?CARDIOVASCULAR: Heart is regular without murmurs, gallops, or rubs. ?BREAST: Right breast palpable mass with palpable axillary lymph nodes ?GI: The abdomen is soft, nontender, and nondistended.  There were no palpable masses. There was no hepatosplenomegaly. There were normal bowel sounds. ?GU: Deferred ?MUSCULOSKELETAL:  Normal muscle strength and tone in all four extremities.    ?SKIN: Skin turgor is normal. There are no pathologic skin lesions.  ?NEUROLOGIC:  Motor and sensation is grossly normal.  Cranial nerves are grossly intact. ?PSYCH:  Alert and oriented to person, place and time. Affect is normal. ? ?Data Reviewed ?CBC ?   ?Component Value Date/Time  ? WBC 18.1 (H) 07/12/2020 1256  ? RBC 5.17 (H) 07/12/2020 1256  ? HGB 15.8 (H) 07/12/2020 1256  ? HCT 48.2 (H) 07/12/2020 1256  ? PLT  305 07/12/2020 1256  ? MCV 93.2 07/12/2020 1256  ? MCH 30.6 07/12/2020 1256  ? MCHC 32.8 07/12/2020 1256  ? RDW 12.9 07/12/2020 1256  ? LYMPHSABS 1.6 07/12/2020 1256  ? MONOABS 1.6 (H) 07/12/2020 1256  ? EOSABS 0.0 07/12/2020 1256  ? BASOSABS 0.1 07/12/2020 1256  ? ? ?I have personally reviewed the patient's imaging and medical records.   ? ?Assessment ?   ?Stage IIIb invasive mammary carcinoma ?Severe COPD ?CHF ? ?Plan ?   ?1.  Insertion of Port-A-Cath ? ?Patient is cleared for surgery from pulmonary standpoint at this moment.  Medical oncology decided to proceed with low-dose chemotherapy.  I discussed with patient the procedure of Port-A-Cath placement.  I discussed with patient the risk of surgery to include bleeding, infection, pneumothorax, hemothorax, hematoma, arteriovenous fistula formation, pain and even risks of of anesthesia including respiratory failure and death.  The patient reports she understood the risk and agreed to proceed with the surgery. ? ?Herbert Pun, MD ? ?Nancy Berg ?11/25/2021, 6:33 AM ? ? ?

## 2021-11-25 NOTE — Discharge Instructions (Addendum)
?  Diet: Resume home heart healthy regular diet.   Activity: Increase activity as tolerated, but light activity and walking are encouraged. Do not drive or drink alcohol if taking narcotic pain medications.  Wound care: May shower with soapy water and pat dry (do not rub incisions), but no baths or submerging incision underwater until follow-up. (no swimming)   Medications: Resume all home medications. For mild to moderate pain: acetaminophen (Tylenol) or ibuprofen (if no kidney disease). Combining Tylenol with alcohol can substantially increase your risk of causing liver disease. Narcotic pain medications, if prescribed, can be used for severe pain, though may cause nausea, constipation, and drowsiness. Do not combine Tylenol and Norco within a 6 hour period as Norco contains Tylenol. If you do not need the narcotic pain medication, you do not need to fill the prescription.  Call office ([phone]) at any time if any questions, worsening pain, fevers/chills, bleeding, drainage from incision site, or other concerns.    AMBULATORY SURGERY  DISCHARGE INSTRUCTIONS   The drugs that you were given will stay in your system until tomorrow so for the next 24 hours you should not:  Drive an automobile Make any legal decisions Drink any alcoholic beverage   You may resume regular meals tomorrow.  Today it is better to start with liquids and gradually work up to solid foods.  You may eat anything you prefer, but it is better to start with liquids, then soup and crackers, and gradually work up to solid foods.   Please notify your doctor immediately if you have any unusual bleeding, trouble breathing, redness and pain at the surgery site, drainage, fever, or pain not relieved by medication.    Additional Instructions:        Please contact your physician with any problems or Same Day Surgery at [phone], Monday through Friday 6 am to 4 pm, or [place] [facility] [place] Main number at  [phone].  

## 2021-11-25 NOTE — Transfer of Care (Signed)
Immediate Anesthesia Transfer of Care Note ? ?Patient: Nancy Berg ? ?Procedure(s) Performed: INSERTION PORT-A-CATH (Chest) ? ?Patient Location: PACU ? ?Anesthesia Type:General ? ?Level of Consciousness: awake, alert  and oriented ? ?Airway & Oxygen Therapy: Patient Spontanous Breathing and Patient connected to face mask oxygen ? ?Post-op Assessment: Report given to RN and Post -op Vital signs reviewed and stable ? ?Post vital signs: Reviewed and stable ? ?Last Vitals:  ?Vitals Value Taken Time  ?BP 114/69 11/25/21 0833  ?Temp 36.3 ?C 11/25/21 0835  ?Pulse 75 11/25/21 0836  ?Resp 22 11/25/21 0836  ?SpO2 94 % 11/25/21 0836  ?Vitals shown include unvalidated device data. ? ?Last Pain:  ?Vitals:  ? 11/25/21 0835  ?TempSrc:   ?PainSc: 0-No pain  ?   ? ?  ? ?Complications: No notable events documented. ?

## 2021-11-26 ENCOUNTER — Other Ambulatory Visit: Payer: Self-pay | Admitting: Oncology

## 2021-11-26 DIAGNOSIS — C50411 Malignant neoplasm of upper-outer quadrant of right female breast: Secondary | ICD-10-CM

## 2021-11-26 MED ORDER — LIDOCAINE-PRILOCAINE 2.5-2.5 % EX CREA: TOPICAL_CREAM | CUTANEOUS | 3 refills | Status: DC

## 2021-11-26 MED ORDER — PROCHLORPERAZINE MALEATE 10 MG PO TABS: 10.0000 mg | ORAL_TABLET | Freq: Four times a day (QID) | ORAL | 1 refills | Status: DC | PRN

## 2021-11-26 MED ORDER — ONDANSETRON HCL 8 MG PO TABS: 8.0000 mg | ORAL_TABLET | Freq: Two times a day (BID) | ORAL | 1 refills | Status: DC | PRN

## 2021-11-26 NOTE — Progress Notes (Signed)
START OFF PATHWAY REGIMEN - Breast ? ? ?[id number]:Carboplatin + weekly Paclitaxel (6/100) q28d: ?  A cycle is every 28 days: ?    Paclitaxel  ?    Carboplatin  ? ?**Always confirm dose/schedule in your pharmacy ordering system** ? ?Patient Characteristics: ?Preoperative or Nonsurgical Candidate (Clinical Staging), Neoadjuvant Therapy followed by Surgery, Invasive Disease, Chemotherapy, HER2 Negative/Unknown/Equivocal, ER Negative/Unknown, Platinum Therapy Indicated and Not a Candidate for Checkpoint  ?Inhibitor ?Therapeutic Status: Preoperative or Nonsurgical Candidate (Clinical Staging) ?AJCC M Category: cM0 ?AJCC Grade: G3 ?Breast Surgical Plan: Neoadjuvant Therapy followed by Surgery ?ER Status: Negative (-) ?AJCC 8 Stage Grouping: IIIB ?HER2 Status: Negative (-) ?AJCC T Category: cT2 ?AJCC N Category: cN1 ?PR Status: Negative (-) ?Intent of Therapy: ?Non-Curative / Palliative Intent, Discussed with Patient ?

## 2021-11-29 ENCOUNTER — Inpatient Hospital Stay: Payer: Medicare HMO | Attending: Oncology

## 2021-11-29 DIAGNOSIS — Z171 Estrogen receptor negative status [ER-]: Secondary | ICD-10-CM | POA: Insufficient documentation

## 2021-11-29 DIAGNOSIS — Z9981 Dependence on supplemental oxygen: Secondary | ICD-10-CM | POA: Insufficient documentation

## 2021-11-29 DIAGNOSIS — J449 Chronic obstructive pulmonary disease, unspecified: Secondary | ICD-10-CM | POA: Insufficient documentation

## 2021-11-29 DIAGNOSIS — R911 Solitary pulmonary nodule: Secondary | ICD-10-CM | POA: Insufficient documentation

## 2021-11-29 DIAGNOSIS — Z79899 Other long term (current) drug therapy: Secondary | ICD-10-CM | POA: Insufficient documentation

## 2021-11-29 DIAGNOSIS — F0390 Unspecified dementia without behavioral disturbance: Secondary | ICD-10-CM | POA: Insufficient documentation

## 2021-11-29 DIAGNOSIS — C50411 Malignant neoplasm of upper-outer quadrant of right female breast: Secondary | ICD-10-CM | POA: Insufficient documentation

## 2021-11-29 DIAGNOSIS — Z5111 Encounter for antineoplastic chemotherapy: Secondary | ICD-10-CM | POA: Insufficient documentation

## 2021-11-29 DIAGNOSIS — E785 Hyperlipidemia, unspecified: Secondary | ICD-10-CM | POA: Insufficient documentation

## 2021-11-29 DIAGNOSIS — C773 Secondary and unspecified malignant neoplasm of axilla and upper limb lymph nodes: Secondary | ICD-10-CM | POA: Insufficient documentation

## 2021-11-29 DIAGNOSIS — Z7951 Long term (current) use of inhaled steroids: Secondary | ICD-10-CM | POA: Insufficient documentation

## 2021-11-29 DIAGNOSIS — I509 Heart failure, unspecified: Secondary | ICD-10-CM | POA: Insufficient documentation

## 2021-11-30 ENCOUNTER — Telehealth: Payer: Self-pay | Admitting: *Deleted

## 2021-11-30 NOTE — Telephone Encounter (Signed)
I called the pharmacy that supplies the emla cream and spoke to staff of pharamacy.  Nancy Berg was not available at this time.  They will make a note for the nurse to put 1 inch over the port site  1 hour before coming for treatment at cancer center with each treatment.  ?

## 2021-11-30 NOTE — Telephone Encounter (Signed)
Pharmacy requests clarification of EMLA cream prescription. ?

## 2021-12-01 ENCOUNTER — Inpatient Hospital Stay: Payer: Medicare HMO

## 2021-12-01 ENCOUNTER — Inpatient Hospital Stay (HOSPITAL_BASED_OUTPATIENT_CLINIC_OR_DEPARTMENT_OTHER): Payer: Medicare HMO | Admitting: Oncology

## 2021-12-01 ENCOUNTER — Encounter: Payer: Self-pay | Admitting: Oncology

## 2021-12-01 VITALS — BP 110/67 | HR 79 | Temp 98.5°F | Resp 16 | Ht 62.0 in | Wt 92.8 lb

## 2021-12-01 VITALS — BP 130/61 | HR 72

## 2021-12-01 DIAGNOSIS — Z171 Estrogen receptor negative status [ER-]: Secondary | ICD-10-CM | POA: Diagnosis not present

## 2021-12-01 DIAGNOSIS — C50411 Malignant neoplasm of upper-outer quadrant of right female breast: Secondary | ICD-10-CM

## 2021-12-01 DIAGNOSIS — R911 Solitary pulmonary nodule: Secondary | ICD-10-CM | POA: Diagnosis not present

## 2021-12-01 DIAGNOSIS — Z79899 Other long term (current) drug therapy: Secondary | ICD-10-CM | POA: Diagnosis not present

## 2021-12-01 DIAGNOSIS — F0390 Unspecified dementia without behavioral disturbance: Secondary | ICD-10-CM | POA: Diagnosis not present

## 2021-12-01 DIAGNOSIS — Z7951 Long term (current) use of inhaled steroids: Secondary | ICD-10-CM | POA: Diagnosis not present

## 2021-12-01 DIAGNOSIS — Z5111 Encounter for antineoplastic chemotherapy: Secondary | ICD-10-CM | POA: Diagnosis not present

## 2021-12-01 DIAGNOSIS — Z9981 Dependence on supplemental oxygen: Secondary | ICD-10-CM | POA: Diagnosis not present

## 2021-12-01 DIAGNOSIS — C773 Secondary and unspecified malignant neoplasm of axilla and upper limb lymph nodes: Secondary | ICD-10-CM | POA: Diagnosis not present

## 2021-12-01 DIAGNOSIS — I509 Heart failure, unspecified: Secondary | ICD-10-CM | POA: Diagnosis not present

## 2021-12-01 DIAGNOSIS — J449 Chronic obstructive pulmonary disease, unspecified: Secondary | ICD-10-CM | POA: Diagnosis not present

## 2021-12-01 DIAGNOSIS — E785 Hyperlipidemia, unspecified: Secondary | ICD-10-CM | POA: Diagnosis not present

## 2021-12-01 LAB — CBC WITH DIFFERENTIAL/PLATELET
Abs Immature Granulocytes: 0.12 10*3/uL — ABNORMAL HIGH (ref 0.00–0.07)
Basophils Absolute: 0.1 10*3/uL (ref 0.0–0.1)
Basophils Relative: 1 %
Eosinophils Absolute: 0.2 10*3/uL (ref 0.0–0.5)
Eosinophils Relative: 1 %
HCT: 43.8 % (ref 36.0–46.0)
Hemoglobin: 13.9 g/dL (ref 12.0–15.0)
Immature Granulocytes: 1 %
Lymphocytes Relative: 15 %
Lymphs Abs: 1.7 10*3/uL (ref 0.7–4.0)
MCH: 29.5 pg (ref 26.0–34.0)
MCHC: 31.7 g/dL (ref 30.0–36.0)
MCV: 93 fL (ref 80.0–100.0)
Monocytes Absolute: 1 10*3/uL (ref 0.1–1.0)
Monocytes Relative: 8 %
Neutro Abs: 8.5 10*3/uL — ABNORMAL HIGH (ref 1.7–7.7)
Neutrophils Relative %: 74 %
Platelets: 242 10*3/uL (ref 150–400)
RBC: 4.71 MIL/uL (ref 3.87–5.11)
RDW: 14.5 % (ref 11.5–15.5)
WBC: 11.5 10*3/uL — ABNORMAL HIGH (ref 4.0–10.5)
nRBC: 0 % (ref 0.0–0.2)

## 2021-12-01 LAB — COMPREHENSIVE METABOLIC PANEL
ALT: 12 U/L (ref 0–44)
AST: 24 U/L (ref 15–41)
Albumin: 3.4 g/dL — ABNORMAL LOW (ref 3.5–5.0)
Alkaline Phosphatase: 78 U/L (ref 38–126)
Anion gap: 7 (ref 5–15)
BUN: 15 mg/dL (ref 8–23)
CO2: 29 mmol/L (ref 22–32)
Calcium: 9.4 mg/dL (ref 8.9–10.3)
Chloride: 101 mmol/L (ref 98–111)
Creatinine, Ser: 0.46 mg/dL (ref 0.44–1.00)
GFR, Estimated: >60 mL/min (ref >60)
Glucose, Bld: 115 mg/dL — ABNORMAL HIGH (ref 70–99)
Potassium: 4 mmol/L (ref 3.5–5.1)
Sodium: 137 mmol/L (ref 135–145)
Total Bilirubin: 0.7 mg/dL (ref 0.3–1.2)
Total Protein: 6.7 g/dL (ref 6.5–8.1)

## 2021-12-01 MED ORDER — FAMOTIDINE IN NACL 20-0.9 MG/50ML-% IV SOLN
20.0000 mg | Freq: Once | INTRAVENOUS | Status: AC
  Administered 2021-12-01: 20 mg via INTRAVENOUS
  Filled 2021-12-01: qty 50

## 2021-12-01 MED ORDER — SODIUM CHLORIDE 0.9 % IV SOLN
65.0000 mg/m2 | Freq: Once | INTRAVENOUS | Status: AC
  Administered 2021-12-01: 90 mg via INTRAVENOUS
  Filled 2021-12-01: qty 15

## 2021-12-01 MED ORDER — DIPHENHYDRAMINE HCL 50 MG/ML IJ SOLN
50.0000 mg | Freq: Once | INTRAMUSCULAR | Status: AC
  Administered 2021-12-01: 50 mg via INTRAVENOUS
  Filled 2021-12-01: qty 1

## 2021-12-01 MED ORDER — HEPARIN SOD (PORK) LOCK FLUSH 100 UNIT/ML IV SOLN
500.0000 [IU] | Freq: Once | INTRAVENOUS | Status: AC | PRN
  Administered 2021-12-01: 500 [IU]
  Filled 2021-12-01: qty 5

## 2021-12-01 MED ORDER — SODIUM CHLORIDE 0.9 % IV SOLN
10.0000 mg | Freq: Once | INTRAVENOUS | Status: AC
  Administered 2021-12-01: 10 mg via INTRAVENOUS
  Filled 2021-12-01: qty 10

## 2021-12-01 MED ORDER — SODIUM CHLORIDE 0.9 % IV SOLN
Freq: Once | INTRAVENOUS | Status: AC
  Filled 2021-12-01: qty 250

## 2021-12-01 NOTE — Progress Notes (Signed)
Pt ok today. No concerns. Will start first chemo today ?

## 2021-12-01 NOTE — Progress Notes (Signed)
? ? ? ?Hematology/Oncology Consult note ?[place]  ?[phone] [phone] ? ?Patient Care Team: ?Verl Blalock, NP as PCP - General (Adult Health Nurse Practitioner) ?Sindy Guadeloupe, MD as Consulting Physician (Hematology)  ? ?Name of the patient: Nancy Berg  ?[id number]  ?03/21/1946  ? ?Date of visit: 12/01/21 ? 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 ?Chief complaint/ Reason for visit-on treatment assessment prior to cycle 1 of Taxol chemotherapy ? ?Heme/Onc history: Patient is a 76 year old female who underwent CT chest without contrast to evaluate symptoms of shortness of breath.  That incidentally showed right axillary adenopathy and soft tissue in the superior right breast.  This was followed by diagnostic right breast mammogram and ultrasound which showed a 2.3 x 1.6 x 1.2 cm irregular hypoechoic mass in the right breast at the 12 o'clock position 1 cm from the nipple.  Enlarged right axillary lymph node 1.8 cm.  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 ?  ?Patient has met with Dr. Ferrel Logan who is awaiting clearance from pulmonary before considering surgery.  At baseline patient lives in an assisted living in [place].  Her son lives in [place].  She is on 24 hours home oxygen.  [name] states that she has declined significantly in the last 1 year.  Prior to that she was living independently and was independent of her ADLs.  She was hospitalized about a year ago and was eventually diagnosed with COPD and has been on oxygen since then. ?  ?PET CT scan shows FDG avid spiculated nodule  with central cavitation in the left upper lobe.  Size an SUV of this nodule was not quantified on the PET scan.  Partially calcified markedly avid right breast lesion with markedly avid right axillary lymph node metastases. ?  ? ?Interval history-patient reports feeling at her baseline.  She has shortness of breath which has not changed significantly.  She is on home oxygen. ? ?ECOG PS- 2-3 ?Pain scale- 0 ? ? ?Review of systems- Review of Systems  ?Constitutional:  Positive for malaise/fatigue. Negative for chills, fever and weight loss.  ?HENT:  Negative for congestion, ear discharge and nosebleeds.   ?Eyes:  Negative for blurred vision.  ?Respiratory:  Positive for shortness of breath. Negative for cough, hemoptysis, sputum production and wheezing.   ?Cardiovascular:  Negative for chest pain, palpitations, orthopnea and claudication.  ?Gastrointestinal:  Negative for abdominal pain, blood in stool, constipation, diarrhea, heartburn, melena, nausea and vomiting.  ?Genitourinary:  Negative for dysuria, flank pain, frequency, hematuria and urgency.  ?Musculoskeletal:  Negative for back pain, joint pain and myalgias.  ?Skin:  Negative for rash.  ?Neurological:  Negative for dizziness, tingling, focal weakness, seizures, weakness and headaches.  ?Endo/Heme/Allergies:  Does not bruise/bleed easily.  ?Psychiatric/Behavioral:  Negative for depression and suicidal ideas. The patient does not have insomnia.    ? ? ? ?Allergies  ?Allergen Reactions  ? Penicillins Rash and Other (See Comments)  ? ? ? ?Past Medical History:  ?Diagnosis Date  ? Arthritis   ? Cancer [facility])   ? CHF (congestive heart failure) ([place])   ? COPD (chronic obstructive pulmonary disease) ([place])   ?  Dementia ([place])   ? Hyperlipidemia   ? ? ? ?Past Surgical History:  ?Procedure Laterality Date  ? BREAST BIOPSY Right 10/14/2021  ? rt 12:00 [place] bx venus clip path pending  ? BREAST BIOPSY Right 10/14/2021  ? rt axilla hydromarker path pending  ? EYE SURGERY  Bilateral 2019  ? cataract  ? PORTACATH PLACEMENT N/A 11/25/2021  ? Procedure: INSERTION PORT-A-CATH;  Surgeon: Herbert Pun, MD;  Location: [facility] ORS;  Service: General;  Laterality: N/A;  ? ? ?Social History  ? ?Socioeconomic History  ? Marital status: Divorced  ?  Spouse name: Not on file  ? Number of children: Not on file  ? Years of education: Not on file  ? Highest education level: Not on file  ?Occupational History  ? Not on file  ?Tobacco Use  ? Smoking status: Former  ?  Types: Cigarettes  ?  Quit date: 10/2019  ?  Years since quitting: 2.0  ? Smokeless tobacco: Never  ?Vaping Use  ? Vaping Use: Never used  ?Substance and Sexual Activity  ? Alcohol use: Not Currently  ? Drug use: Not Currently  ? Sexual activity: Not Currently  ?Other Topics Concern  ? Not on file  ?Social History Narrative  ? Not on file  ? ?Social Determinants of Health  ? ?Financial Resource Strain: Not on file  ?Food Insecurity: Not on file  ?Transportation Needs: Not on file  ?Physical Activity: Not on file  ?Stress: Not on file  ?Social Connections: Not on file  ?Intimate Partner Violence: Not on file  ? ? ?Family History  ?Problem Relation Age of Onset  ? Breast cancer Mother   ? Lung cancer Sister   ? ? ? ?Current Outpatient Medications:  ?  acetaminophen (TYLENOL) 325 MG tablet, Take 650 mg by mouth See admin instructions. Take 2 tablets (650 mg) by mouth 3 times daily scheduled (0800, 1400 & 2000) & take 2 tablets (650 mg) by mouth up to twice daily as needed for pain., Disp: , Rfl:  ?  albuterol (VENTOLIN HFA) 108 (90 Base) MCG/ACT inhaler, Inhale 2 puffs into the lungs every 6 (six) hours as needed for shortness of breath or wheezing., Disp: , Rfl:  ?  alendronate (FOSAMAX) 70 MG tablet, Take 70 mg by mouth every Monday., Disp: , Rfl:  ?  ammonium lactate (LAC-HYDRIN) 12 % lotion, Apply 1 application. topically daily. (0800) Lower legs., Disp: , Rfl:  ?  atorvastatin (LIPITOR) 20 MG tablet, Take 20 mg by mouth daily.  (0800), Disp: , Rfl:  ?  budesonide (PULMICORT) 0.5 MG/2ML nebulizer solution, Inhale 0.5 mg into the lungs daily. (0800), Disp: , Rfl:  ?  calcium carbonate (TUMS EX) 750 MG chewable tablet, Chew 1 tablet by mouth in the morning and at bedtime. (0800 & 2000), Disp: , Rfl:  ?  INCRUSE ELLIPTA 62.5 MCG/ACT AEPB, Inhale 1 puff into the lungs daily. (0800), Disp: , Rfl:  ?  lidocaine-prilocaine (EMLA) cream, Apply to affected area once, Disp: 30 g, Rfl: 3 ?  LUMIGAN 0.01 % SOLN, Place 1 drop into both eyes at bedtime. (2000), Disp: , Rfl:  ?  metoprolol succinate (TOPROL-XL) 25 MG 24 hr tablet, Take 50 mg by mouth daily. (0800) Hold if SBP<100 or Pulse <60., Disp: , Rfl:  ?  OXYGEN, Inhale 2 L/min into the lungs as needed (shortness of breath OR if pulse ox is <90% on room air)., Disp: , Rfl:  ?  potassium chloride SA (KLOR-CON M)  20 MEQ tablet, Take 20 mEq by mouth daily. (0800), Disp: , Rfl:  ?  Cholecalciferol (VITAMIN D3) 50 MCG (2000 UT) TABS, Take 2,000 Units by mouth daily. (0800) (Patient not taking: Reported on 12/01/2021), Disp: , Rfl:  ?  guaiFENesin (MUCINEX) 600 MG 12 hr tablet, Take 600 mg by mouth 2 (two) times daily as needed (congestion.). (Patient not taking: Reported on 12/01/2021), Disp: , Rfl:  ?  Multiple Vitamins-Minerals (HEALTHY EYES SUPERVISION 2 PO), Take 1 capsule by mouth in the morning and at bedtime. (0730 & 1730) (Patient not taking: Reported on 12/01/2021), Disp: , Rfl:  ?  ondansetron (ZOFRAN) 8 MG tablet, Take 1 tablet (8 mg total) by mouth 2 (two) times daily as needed (Nausea or vomiting). (Patient not taking: Reported on 12/01/2021), Disp: 30 tablet, Rfl: 1 ?  prochlorperazine (COMPAZINE) 10 MG tablet, Take 1 tablet (10 mg total) by mouth every 6 (six) hours as needed (Nausea or vomiting). (Patient not taking: Reported on 12/01/2021), Disp: 30 tablet, Rfl: 1 ? ?Physical exam:  ?Vitals:  ? 12/01/21 0916  ?BP: 110/67  ?Pulse: 79  ?Resp: 16  ?Temp: 98.5 ?F (36.9 ?C)  ?TempSrc: Oral  ?Weight: 92  lb 12.8 oz (42.1 kg)  ?Height: $RemoveB'5\' 2"'[id number]$  (1.575 m)  ? ?Physical Exam ?Constitutional:   ?   Comments: Elderly frail woman sitting in a wheelchair and appears in no acute distress.  On home oxygen  ?Cardiovascular:  ?

## 2021-12-01 NOTE — Patient Instructions (Signed)
[facility]  Discharge Instructions: Thank you for choosing [facility] to provide your oncology and hematology care.  If you have a lab appointment with the Cancer Center, please go directly to the Cancer Center and check in at the registration area.  Wear comfortable clothing and clothing appropriate for easy access to any Portacath or PICC line.   We strive to give you quality time with your provider. You may need to reschedule your appointment if you arrive late (15 or more minutes).  Arriving late affects you and other patients whose appointments are after yours.  Also, if you miss three or more appointments without notifying the office, you may be dismissed from the clinic at the provider's discretion.      For prescription refill requests, have your pharmacy contact our office and allow 72 hours for refills to be completed.    Today you received the following chemotherapy and/or immunotherapy agents Taxol      To help prevent nausea and vomiting after your treatment, we encourage you to take your nausea medication as directed.  BELOW ARE SYMPTOMS THAT SHOULD BE REPORTED IMMEDIATELY: *FEVER GREATER THAN 100.4 F (38 C) OR HIGHER *CHILLS OR SWEATING *NAUSEA AND VOMITING THAT IS NOT CONTROLLED WITH YOUR NAUSEA MEDICATION *UNUSUAL SHORTNESS OF BREATH *UNUSUAL BRUISING OR BLEEDING *URINARY PROBLEMS (pain or burning when urinating, or frequent urination) *BOWEL PROBLEMS (unusual diarrhea, constipation, pain near the anus) TENDERNESS IN MOUTH AND THROAT WITH OR WITHOUT PRESENCE OF ULCERS (sore throat, sores in mouth, or a toothache) UNUSUAL RASH, SWELLING OR PAIN  UNUSUAL VAGINAL DISCHARGE OR ITCHING   Items with * indicate a potential emergency and should be followed up as soon as possible or go to the Emergency Department if any problems should occur.  Please show the CHEMOTHERAPY ALERT CARD or IMMUNOTHERAPY ALERT CARD at check-in to the  Emergency Department and triage nurse.  Should you have questions after your visit or need to cancel or reschedule your appointment, please contact [facility]  [phone] and follow the prompts.  Office hours are 8:00 a.m. to 4:30 p.m. Monday - Friday. Please note that voicemails left after 4:00 p.m. may not be returned until the following business day.  We are closed weekends and major holidays. You have access to a nurse at all times for urgent questions. Please call the main number to the clinic [phone] and follow the prompts.  For any non-urgent questions, you may also contact your provider using MyChart. We now offer e-Visits for anyone 18 and older to request care online for non-urgent symptoms. For details visit [email or web address].   Also download the MyChart app! Go to the app store, search "MyChart", open the app, select , and log in with your MyChart username and password.  Due to Covid, a mask is required upon entering the hospital/clinic. If you do not have a mask, one will be given to you upon arrival. For doctor visits, patients may have 1 support person aged 18 or older with them. For treatment visits, patients cannot have anyone with them due to current Covid guidelines and our immunocompromised population.  

## 2021-12-02 ENCOUNTER — Telehealth: Payer: Self-pay

## 2021-12-02 NOTE — Telephone Encounter (Signed)
Telephone call to patient for follow up after receiving first infusion.   No answer but left message stating we were calling to check on them.  Encouraged patient to call for any questions or concerns.   

## 2021-12-06 ENCOUNTER — Encounter: Payer: Self-pay | Admitting: Oncology

## 2021-12-07 ENCOUNTER — Telehealth: Payer: Self-pay | Admitting: *Deleted

## 2021-12-07 ENCOUNTER — Encounter: Payer: Self-pay | Admitting: *Deleted

## 2021-12-07 NOTE — Telephone Encounter (Signed)
I got a message from Dr. Janese Banks that said that she has spoke to Dr. Peyton Najjar and they are good with surgery. Due to the surgery going to be done , Nancy Berg will not need to come tomorrow for chemotherapy. I have sent my chart message as well as leaving a message on son's voicemail. We would like to know when she gets the surgery. Also Dr. Janese Banks wanted you and your mother know that after surgery and she gets better then she will start back with treatment. ?Please call if needed or send my chart message. ?Thanks ? ?This is the message I left on his voicemail ?

## 2021-12-08 ENCOUNTER — Inpatient Hospital Stay: Payer: Medicare HMO

## 2021-12-10 ENCOUNTER — Telehealth: Payer: Self-pay | Admitting: Oncology

## 2021-12-10 ENCOUNTER — Ambulatory Visit: Payer: Self-pay | Admitting: General Surgery

## 2021-12-10 NOTE — Telephone Encounter (Signed)
Left VM with patient to make her aware of treatment scheduled for 6/12. Requested she call back to confirm. Mailing AVS.  ?

## 2021-12-10 NOTE — H&P (Signed)
HISTORY OF PRESENT ILLNESS:  ?  ?Ms. Eimer is a 77 y.o.female patient who comes for evaluation of surgical management for right breast cancer. ? 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  Patiently previously evaluated by medical oncology and assessed that she is too high risk for chemotherapy.  Discussion while surgery is the only option for treatment.  Initially she was evaluated by her pulmonologist who did not clear her for surgery due to her severe COPD.  Pulmonology has been optimizing patient. ? ?Patient was evaluated by pulmonology on 12/03/2018.  I reviewed the chart for this last evaluation.  After much discussion the pulmonologist discussed with patient about her prolonged function that it is optimize as much as possible and she understand that she is high risk for prolonged mechanical ventilation. ? ?Due to the delay in clearance patient received 1 dose of low-dose chemotherapy.  No issues with this dose. ?   ?  ?PAST MEDICAL HISTORY:  ?Past Medical History:  ?Diagnosis Date  ? COPD (chronic obstructive pulmonary disease) (CMS-HCC)   ? Hypertension   ?  ?  ?  ?PAST SURGICAL HISTORY:   ?No past surgical history on file.    ?   ?MEDICATIONS:  ?Outpatient Encounter Medications as of 12/09/2021  ?Medication Sig Dispense Refill  ? acetaminophen (TYLENOL) 325 MG tablet     ? albuterol (PROVENTIL) 2.5 mg /3 mL (0.083 %) nebulizer solution Take 3 mLs (2.5 mg total) by nebulization every 6 (six) hours as needed for Wheezing 225 mL 1  ? albuterol 90 mcg/actuation inhaler     ? alendronate (FOSAMAX) 70 MG tablet     ? atorvastatin (LIPITOR) 20 MG tablet     ? budesonide (PULMICORT) 0.5 mg/2 mL nebulizer solution Take 2 mLs (0.5 mg total) by nebulization once daily 180 mL 1  ? INCRUSE ELLIPTA 62.5 mcg/actuation DsDv inhalation unit     ? latanoprost (XALATAN) 0.005 % ophthalmic solution     ? metoprolol  succinate (TOPROL-XL) 25 MG XL tablet     ? potassium chloride (KLOR-CON) 20 MEQ ER tablet     ? ?No facility-administered encounter medications on file as of 12/09/2021.  ? ?  ?ALLERGIES:   ?Penicillins ?  ?SOCIAL HISTORY:  ?Social History  ? ?Socioeconomic History  ? Marital status: Widowed  ?Tobacco Use  ? Smoking status: Former  ? Smokeless tobacco: Never  ?Vaping Use  ? Vaping Use: Never used  ?Substance and Sexual Activity  ? Alcohol use: Not Currently  ? Drug use: Defer  ? Sexual activity: Defer  ? ? ?FAMILY HISTORY:  ?Family History  ?Problem Relation Age of Onset  ? Stroke Mother   ? Myocardial Infarction (Heart attack) Father   ? Lung cancer Sister   ? Bone cancer Sister   ?  ? ?GENERAL REVIEW OF SYSTEMS:  ? ?General ROS: negative for - chills, fatigue, fever, weight gain or weight loss ?Allergy and Immunology ROS: negative for - hives  ?Hematological and Lymphatic ROS: negative for - bleeding problems or bruising, negative for palpable nodes ?Endocrine ROS: negative for - heat or cold intolerance, hair changes ?Respiratory ROS: negative for - cough, shortness of breath or wheezing ?Cardiovascular ROS: no chest pain or palpitations ?GI ROS: negative for nausea, vomiting, abdominal pain, diarrhea, constipation ?Musculoskeletal ROS: negative for - joint swelling or muscle pain ?Neurological ROS: negative for - confusion, syncope ?Dermatological ROS: negative  for pruritus and rash ? ?PHYSICAL EXAM:  ?Vitals:  ? 12/09/21 1420  ?BP: 109/60  ?Pulse: 96  ?.  ?Ht:149.9 cm (4' 11") Wt:(!) 42.2 kg (93 lb) YIF:OYDX surface area is 1.33 meters squared. ?Body mass index is 18.78 kg/m?Marland Kitchen. ?  ?GENERAL: Alert, active, oriented x3 ? ?HEENT: Pupils equal reactive to light. Extraocular movements are intact. Sclera clear. Palpebral conjunctiva normal red color.Pharynx clear. ? ?NECK: Supple with no palpable mass and no adenopathy. ? ?LUNGS: Sound clear with no rales rhonchi or wheezes. ? ?BREAST: Palpable mass at the upper  breast with clinically palpable axillary lymph nodes. ? ?EXTREMITIES: Well-developed well-nourished symmetrical with no dependent edema. ? ?NEUROLOGICAL: Awake alert oriented, facial expression symmetrical, moving all extremities. ?  ?   ?IMPRESSION:  ?  ? Breast cancer metastasized to axillary lymph node, right (CMS-HCC) [C50.911, C77.3] ?-Patient with palpable breast mass and clinically palpable axillary lymph nodes ?-Core biopsy showed invasive mammary carcinoma ER negative, PR negative, HER2 negative        ?-Medical oncology and assess patient on a candidate for chemotherapy. ?-Initial evaluation by pulmonary patient was not cleared for surgery. ?-Outpatient to optimize as best as possible but still high risk for pulmonary complications ?-After long discussion with pulmonary team, medical oncology team, the patient and her son they all agree that they want to proceed with modified radical mastectomy understanding the risk of prolonged mechanical ventilation. ?-I discussed with the patient and her son about the surgery including the removal of the right breast with the lymph node on the right axillary area.  I discussed with patient the risk of bleeding, infection, respiratory failure, injury to nerves, chronic pain, swelling of the upper extremity, lymphedema, deep venous thrombosis, pulmonary embolism and even death. ?I was very clear with this complications risk with the patient and her son and they endorses they understood this risk and agreed to proceed with surgery. ?  ?PLAN:  ?1.  Right breast modified radical mastectomy (41287) ?2.  CBC, CMP done ?3.  Avoid taking aspirin 5 days before the surgery ?4.  Pulmonary clearance done ?5.  Contact us if you have any concern ? ?Patient and her son verbalized understanding, all questions were answered, and were agreeable with the plan outlined above.  ? ?Herbert Pun, MD ? ?Electronically signed by Herbert Pun, MD ? ?

## 2021-12-10 NOTE — H&P (View-Only) (Signed)
HISTORY OF PRESENT ILLNESS:  ?  ?Nancy Berg is a 76 y.o.female patient who comes for evaluation of surgical management for right breast cancer. ? 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  Patiently previously evaluated by medical oncology and assessed that she is too high risk for chemotherapy.  Discussion while surgery is the only option for treatment.  Initially she was evaluated by her pulmonologist who did not clear her for surgery due to her severe COPD.  Pulmonology has been optimizing patient. ? ?Patient was evaluated by pulmonology on 12/03/2018.  I reviewed the chart for this last evaluation.  After much discussion the pulmonologist discussed with patient about her prolonged function that it is optimize as much as possible and she understand that she is high risk for prolonged mechanical ventilation. ? ?Due to the delay in clearance patient received 1 dose of low-dose chemotherapy.  No issues with this dose. ?   ?  ?PAST MEDICAL HISTORY:  ?Past Medical History:  ?Diagnosis Date  ? COPD (chronic obstructive pulmonary disease) (CMS-HCC)   ? Hypertension   ?  ?  ?  ?PAST SURGICAL HISTORY:   ?No past surgical history on file.    ?   ?MEDICATIONS:  ?Outpatient Encounter Medications as of 12/09/2021  ?Medication Sig Dispense Refill  ? acetaminophen (TYLENOL) 325 MG tablet     ? albuterol (PROVENTIL) 2.5 mg /3 mL (0.083 %) nebulizer solution Take 3 mLs (2.5 mg total) by nebulization every 6 (six) hours as needed for Wheezing 225 mL 1  ? albuterol 90 mcg/actuation inhaler     ? alendronate (FOSAMAX) 70 MG tablet     ? atorvastatin (LIPITOR) 20 MG tablet     ? budesonide (PULMICORT) 0.5 mg/2 mL nebulizer solution Take 2 mLs (0.5 mg total) by nebulization once daily 180 mL 1  ? INCRUSE ELLIPTA 62.5 mcg/actuation DsDv inhalation unit     ? latanoprost (XALATAN) 0.005 % ophthalmic solution     ? metoprolol  succinate (TOPROL-XL) 25 MG XL tablet     ? potassium chloride (KLOR-CON) 20 MEQ ER tablet     ? ?No facility-administered encounter medications on file as of 12/09/2021.  ? ?  ?ALLERGIES:   ?Penicillins ?  ?SOCIAL HISTORY:  ?Social History  ? ?Socioeconomic History  ? Marital status: Widowed  ?Tobacco Use  ? Smoking status: Former  ? Smokeless tobacco: Never  ?Vaping Use  ? Vaping Use: Never used  ?Substance and Sexual Activity  ? Alcohol use: Not Currently  ? Drug use: Defer  ? Sexual activity: Defer  ? ? ?FAMILY HISTORY:  ?Family History  ?Problem Relation Age of Onset  ? Stroke Mother   ? Myocardial Infarction (Heart attack) Father   ? Lung cancer Sister   ? Bone cancer Sister   ?  ? ?GENERAL REVIEW OF SYSTEMS:  ? ?General ROS: negative for - chills, fatigue, fever, weight gain or weight loss ?Allergy and Immunology ROS: negative for - hives  ?Hematological and Lymphatic ROS: negative for - bleeding problems or bruising, negative for palpable nodes ?Endocrine ROS: negative for - heat or cold intolerance, hair changes ?Respiratory ROS: negative for - cough, shortness of breath or wheezing ?Cardiovascular ROS: no chest pain or palpitations ?GI ROS: negative for nausea, vomiting, abdominal pain, diarrhea, constipation ?Musculoskeletal ROS: negative for - joint swelling or muscle pain ?Neurological ROS: negative for - confusion, syncope ?Dermatological ROS: negative   for pruritus and rash ? ?PHYSICAL EXAM:  ?Vitals:  ? 12/09/21 1420  ?BP: 109/60  ?Pulse: 96  ?.  ?Ht:149.9 cm (4' 11") Wt:(!) 42.2 kg (93 lb) BSA:Body surface area is 1.33 meters squared. ?Body mass index is 18.78 kg/m?.. ?  ?GENERAL: Alert, active, oriented x3 ? ?HEENT: Pupils equal reactive to light. Extraocular movements are intact. Sclera clear. Palpebral conjunctiva normal red color.Pharynx clear. ? ?NECK: Supple with no palpable mass and no adenopathy. ? ?LUNGS: Sound clear with no rales rhonchi or wheezes. ? ?BREAST: Palpable mass at the upper  breast with clinically palpable axillary lymph nodes. ? ?EXTREMITIES: Well-developed well-nourished symmetrical with no dependent edema. ? ?NEUROLOGICAL: Awake alert oriented, facial expression symmetrical, moving all extremities. ?  ?   ?IMPRESSION:  ?  ? Breast cancer metastasized to axillary lymph node, right (CMS-HCC) [C50.911, C77.3] ?-Patient with palpable breast mass and clinically palpable axillary lymph nodes ?-Core biopsy showed invasive mammary carcinoma ER negative, PR negative, HER2 negative        ?-Medical oncology and assess patient on a candidate for chemotherapy. ?-Initial evaluation by pulmonary patient was not cleared for surgery. ?-Outpatient to optimize as best as possible but still high risk for pulmonary complications ?-After long discussion with pulmonary team, medical oncology team, the patient and her son they all agree that they want to proceed with modified radical mastectomy understanding the risk of prolonged mechanical ventilation. ?-I discussed with the patient and her son about the surgery including the removal of the right breast with the lymph node on the right axillary area.  I discussed with patient the risk of bleeding, infection, respiratory failure, injury to nerves, chronic pain, swelling of the upper extremity, lymphedema, deep venous thrombosis, pulmonary embolism and even death. ?I was very clear with this complications risk with the patient and her son and they endorses they understood this risk and agreed to proceed with surgery. ?  ?PLAN:  ?1.  Right breast modified radical mastectomy (19307) ?2.  CBC, CMP done ?3.  Avoid taking aspirin 5 days before the surgery ?4.  Pulmonary clearance done ?5.  Contact us if you have any concern ? ?Patient and her son verbalized understanding, all questions were answered, and were agreeable with the plan outlined above.  ? ?Nancy Avilla Cintron-Diaz, MD ? ?Electronically signed by Nancy Strohm Cintron-Diaz, MD ? ?

## 2021-12-15 ENCOUNTER — Inpatient Hospital Stay: Payer: Medicare HMO

## 2021-12-15 ENCOUNTER — Inpatient Hospital Stay: Payer: Medicare HMO | Admitting: Oncology

## 2021-12-20 ENCOUNTER — Encounter
Admission: RE | Admit: 2021-12-20 | Discharge: 2021-12-20 | Disposition: A | Discharge status: Home / Self Care | Payer: Medicare HMO | Source: Ambulatory Visit | Attending: General Surgery | Admitting: General Surgery

## 2021-12-20 DIAGNOSIS — J449 Chronic obstructive pulmonary disease, unspecified: Secondary | ICD-10-CM

## 2021-12-20 DIAGNOSIS — Z01818 Encounter for other preprocedural examination: Secondary | ICD-10-CM

## 2021-12-20 NOTE — Pre-Procedure Instructions (Signed)
Pt is a resident at [facility] over to [facility] to get list of meds faxed over to PAT. Verbalized they would fax over later today. Called son [place]) Quillian Quince, to make sure pt signs her own consents. He said his mom can still sign her own consents. He will be bringing her to the hospital the day of her surgery. I gave Quillian Quince SDS # to call the day before surgery between 1-3 pm to find out his mom's arrival time.

## 2021-12-20 NOTE — Pre-Procedure Instructions (Signed)
Faxed over surgery instructions to [facility] with fax confirmation. Will call [facility] once I am back to work on Wednesday to make sure they received instructions and to see if they have any questions

## 2021-12-20 NOTE — Patient Instructions (Addendum)
Your procedure is scheduled on:12-28-21 Tuesday Report to the Registration Desk on the 1st floor of the [facility].Then proceed to the 2nd floor Surgery Desk To find out your arrival time, please call [phone] between 1PM - 3PM on:12-27-21 Monday If your arrival time is 6:00 am, do not arrive prior to that time as the [facility] entrance doors do not open until 6:00 am.  REMEMBER: Instructions that are not followed completely may result in serious medical risk, up to and including death; or upon the discretion of your surgeon and anesthesiologist your surgery may need to be rescheduled.  Do not eat food after midnight the night before surgery.  No gum chewing, lozengers or hard candies.  You may however, drink Water up to 2 hours before you are scheduled to arrive for your surgery. Do not drink anything within 2 hours of your scheduled arrival time  [place] WITH A SIP OF WATER: -atorvastatin (LIPITOR)  -metoprolol succinate (TOPROL-XL)  Use your budesonide (PULMICORT), Albuterol Inhaler and your INCRUSE ELLIPTA the morning of surgery    One week prior to surgery: Stop Anti-inflammatories (NSAIDS) such as Advil, Aleve, Ibuprofen, Motrin, Naproxen, Naprosyn and Aspirin based products such as Excedrin, Goodys Powder, BC Powder.You may however, continue to take Tylenol if needed for pain up until the day of surgery.  Stop ANY OVER THE COUNTER supplements/vitamins NOW (12-20-21) until after surgery (Vitamin D3 and Healthy Eyes Supervision 2)  No Alcohol for 24 hours before or after surgery.  No Smoking including e-cigarettes for 24 hours prior to surgery.  No chewable tobacco products for at least 6 hours prior to surgery.  No nicotine patches on the day of surgery.  Do not use any "recreational" drugs for at least a week prior to your surgery.  Please be advised that the combination of cocaine and anesthesia may have negative outcomes, up to and  including death. If you test positive for cocaine, your surgery will be cancelled.  On the morning of surgery brush your teeth with toothpaste and water, you may rinse your mouth with mouthwash if you wish. Do not swallow any toothpaste or mouthwash.  Do not wear jewelry, make-up, hairpins, clips or nail polish.  Do not wear lotions, powders, or perfumes.   Do not shave body from the neck down 48 hours prior to surgery just in case you cut yourself which could leave a site for infection.  Also, freshly shaved skin may become irritated if using the CHG soap.  Contact lenses, hearing aids and dentures may not be worn into surgery.  Do not bring valuables to the hospital. [facility] is not responsible for any missing/lost belongings or valuables.   Notify your doctor if there is any change in your medical condition (cold, fever, infection).  Wear comfortable clothing (specific to your surgery type) to the hospital.  After surgery, you can help prevent lung complications by doing breathing exercises.  Take deep breaths and cough every 1-2 hours. Your doctor may order a device called an Incentive Spirometer to help you take deep breaths. When coughing or sneezing, hold a pillow firmly against your incision with both hands. This is called "splinting." Doing this helps protect your incision. It also decreases belly discomfort.  If you are being admitted to the hospital overnight, leave your suitcase in the car. After surgery it may be brought to your room.  If you are being discharged the day of surgery, you will not be  allowed to drive home. You will need a responsible adult (18 years or older) to drive you home and stay with you that night.   If you are taking public transportation, you will need to have a responsible adult (18 years or older) with you. Please confirm with your physician that it is acceptable to use public transportation.   Please call the [facility] Dept.  at [phone] if you have any questions about these instructions.  Surgery Visitation Policy:  Patients undergoing a surgery or procedure may have two family members or support persons with them as long as the person is not COVID-19 positive or experiencing its symptoms.   Inpatient Visitation:    Visiting hours are 7 a.m. to 8 p.m. Up to four visitors are allowed at one time in a patient room, including children. The visitors may rotate out with other people during the day. One designated support person (adult) may remain overnight.

## 2021-12-21 NOTE — Progress Notes (Signed)
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   Screened as appropriate for cephalosporin use during medication reconciliation No immediate angioedema, dysphagia, SOB, anaphylaxis symptoms. No severe rash involving mucous membranes or skin necrosis. No hospital admissions related to side effects of PCN/cephalosporin use.  No documented reaction to PCN or cephalosporin in the last 10 years.  Request:  As an evidence based approach to reducing the rate of incidence for post-operative SSI and the development of MDROs, could an agent that allows for narrower antimicrobial coverage for preoperative prophylaxis in this patient's upcoming surgical course be considered?   Currently ordered preoperative prophylactic ABX: vancomycin.   Specifically requesting change to cephalosporin (CEFAZOLIN).  Drug of choice for many procedures; it is the most widely studied antimicrobial agent with proven efficacy for antimicrobial prophylaxis.   Desirable duration of action, spectrum of activity against organisms commonly encountered in surgery, and it has an excellent safety profile and low cost.   Active against streptococci, methicillin-susceptible staphylococci, and many gram-negative organisms.  Please  communicate decision with me and I will change the orders in Epic as per your direction.   Things to consider: Many patients report that they were "allergic" to PCN earlier in life, however this does not translate into a true lifelong allergy. Patients can lose sensitivity to specific IgE antibodies over time if PCN is avoided (Kleris & Lugar, 2019).  Up to 10% of the adult population and 15% of hospitalized patients report an allergy to PCN, however clinical studies suggest that 90% of those reporting an allergy can tolerate PCN antibiotics (Kleris & Lugar, 2019).  Cross-sensitivity between PCN and cephalosporins has been documented as being as high as 10%, however this estimation included data believed to have been collected in a setting where there was contamination. Newer data suggests that the prevalence of cross-sensitivity between PCN and cephalosporins is actually estimated to be closer to 1% (Hermanides et al., 2018).   Patients labeled as PCN allergic, whether they are truly allergic or not, have been found to have inferior outcomes in terms of rates of serious infection, and these patients tend to have longer hospital stays ([place], 2019).  Treatment related secondary infections, such as Clostridioides difficile, have been linked to the improper use of broad spectrum antibiotics in patients improperly labeled as PCN allergic (Kleris & Lugar, 2019).  Anaphylaxis from cephalosporins is rare and the evidence suggests that there is no increased risk of an anaphylactic type reaction when cephalosporins are used in a PCN allergic patient (Pichichero, 2006).  Citations: Hermanides J, Lemkes BA, Prins Pearla Dubonnet MW, Terreehorst I. Presumed ?-Lactam Allergy and Cross-reactivity in the Operating Theater: A Practical Approach. Anesthesiology. 2018 Aug;129(2):335-342. doi: 10.1097/ALN.0000000000002252. PMID: 70263785.  Kleris, [place]., & Lugar, P. L. (2019). Things We Do For No Reason: Failing to  Question a Penicillin Allergy History.  Journal of hospital medicine, 14(10), [phone]. Advance online publication. https://www.wallace-middleton.info/  Pichichero, M. E. (2006). Cephalosporins can be prescribed safely for penicillin-allergic patients. Journal of family medicine, 55(2), 106-112. Accessed: https://cdn.mdedge.com/files/s79f-public/Document/September-2017/5502JFP_AppliedEvidence1.pdf   BHonor Loh MSN, APRN, FNP-C, CEN [facility] Peri-operative Services Nurse Practitioner FAX: ([phone]/23/23 2:33 PM

## 2021-12-22 NOTE — Progress Notes (Signed)
  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 Following analysis of the risk versus benefits, the patient's primary attending surgeon advised that it would be acceptable to discontinue the ordered vancomycin and place an order for cefazolin 2 gm IV on call to the OR. Orders for this patient were amended by me following collaborative conversation with attending surgeon taking into consideration of risk versus benefits associated with the change in therapy.  Honor Loh, MSN, APRN, FNP-C, CEN [facility]  Peri-operative Services Nurse Practitioner Phone: [phone] 12/22/21 7:50 AM

## 2021-12-23 NOTE — Anesthesia Preprocedure Evaluation (Addendum)
Anesthesia Evaluation  Patient identified by MRN, date of birth, ID band Patient awake    Reviewed: Allergy & Precautions, NPO status , Patient's Chart, lab work & pertinent test results  History of Anesthesia Complications Negative for: history of anesthetic complications  Airway Mallampati: II  TM Distance: >3 FB Neck ROM: Full    Dental  (+) Caps, Chipped   Pulmonary neg sleep apnea, COPD,  COPD inhaler and oxygen dependent, Patient abstained from smoking.Not current smoker, former smoker,  Severe obstructive COPD, on chronic O2 (2L/min), resting SpO2 on 2L/min O2 = 94%. Patient took her two inhalers and nebulizer today.  Per pulm note: "after much discussion. She is aware of the poor lung function but motivated to have the surgery done. Son is here as well and agreed. 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 The left ventricle is normal in size with normal wall thickness.  2. The left ventricular systolic function is normal, LVEF is visually  estimated at > 55%.  3. The aortic valve is trileaflet with mildly thickened leaflets with normal  excursion.  4. The right ventricle is normal in size, with normal systolic function.     Neuro/Psych PSYCHIATRIC DISORDERS Dementia negative neurological ROS     GI/Hepatic neg GERD  ,(+)     (-)  substance abuse  ,   Endo/Other  neg diabetes  Renal/GU negative Renal ROS     Musculoskeletal  (+) Arthritis ,   Abdominal   Peds  Hematology  (+) Blood dyscrasia, anemia ,   Anesthesia Other Findings Past Medical History: No date: Arthritis No date: Cancer ([place]) No date: CHF (congestive heart failure) (HCC) No date: COPD (chronic obstructive pulmonary disease) (HCC) No date: Dementia (HCC) No date: Hyperlipidemia  Reproductive/Obstetrics                           Anesthesia Physical Anesthesia Plan  ASA: 4  Anesthesia Plan: General and Regional   Post-op Pain Management: Regional block*, Ofirmev IV (intra-op)* and Ketamine IV*   Induction: Intravenous  PONV Risk Score and Plan: 2 and Propofol infusion, TIVA, Ondansetron, Midazolam, Dexamethasone and Treatment may vary due to age or medical condition  Airway Management Planned: Nasal Cannula and Natural Airway  Additional Equipment: None  Intra-op Plan:   Post-operative Plan: Possible Post-op intubation/ventilation  Informed Consent: I have reviewed the patients History and Physical, chart, labs and discussed the procedure including the risks, benefits and alternatives for the proposed anesthesia with the patient or authorized representative who has indicated his/her understanding and acceptance.     Dental advisory given and Consent reviewed with POA  Plan Discussed with: CRNA and Surgeon  Anesthesia Plan Comments: (Plan for natural airway with propofol/ketamine + erector spinae plane block for regional surgical anesthesia. Backup is GETA; patient and son understand potential risk of prolonged intubation. Discussed risks of anesthesia with  patient and her son at bedside, including possibility of difficulty with spontaneous ventilation under anesthesia necessitating airway intervention, PONV, and rare risks such as cardiac or respiratory or neurological events, and allergic reactions.  Discussed the role of CRNA in patient's perioperative care. Patient understands. Patient counseled on being higher risk for anesthesia due to comorbidities: severe O2-dependent COPD. Patient was told about increased risk of cardiac and respiratory events, including death.  Discussed r/b/a of Erector Spinae Plane block, including:  - bleeding, infection, nerve damage - poor or non functioning block. - reactions and toxicity to local anesthetic - Pneumothorax  Patient understands.  )      Anesthesia Quick Evaluation

## 2021-12-23 NOTE — Progress Notes (Signed)
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 Herbert Pun, MD.  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  Plans are for patient to be treated with paclitaxel chemotherapy.  If she tolerates this medication, oncologist considering the addition of a additional antineoplastic agent (carboplatin) and immunotherapy (pembrolizumab).  Patient is frail with an ECOG of 2-3, therefore it is unclear if patient will be able to tolerate anticipated regimen.Patient underwent implanted port placement on 11/25/2021 [facility] at [facility] under MAC anesthesia (ASA III) with no documented complications. Since that time, she has received cycle 1 of reduced dose paclitaxel (65 mg/m).  Patient to undergo surgical resection of the RIGHT breast, with plans to pursue adjuvant antineoplastic chemotherapy +/- immunotherapy.   Patient has a history of advanced COPD.  She is followed by pulmonary medicine Raul Del MD).  She was last seen in the pulmonology clinic on 12/02/2021; notes reviewed.  Patient underwent repeat pulmonary function testing that revealed spirometry and flow loop consistent with "very severe obstruction".  Spirometry values as follows:  FVC was 1.16 Liters, 43% of predicted FEV1 was 0.67 Liters, 32% of predicted FEV1 ratio was 57.18%, 74% of predicted FEF  25-75% Liters per second was 0.34, 18% of predicted  Pulmonary medicine provider has voiced concern to both the patient and her son regarding her ability to proceed with surgical resection of her known breast malignancy during previous visits.  MD has made attempts to optimize patient's pulmonary function via the addition of nebulizers to help with pulmonary recruitment.  Following repeat PFTs, MD met with patient and her son again to discuss.  Per Dr. Raul Del, "after much discussion, she is aware of her poor lung function, but is motivated to have the surgery done.  Son is here as well and has agreed.  Hence, will clear for the procedure, with the understanding that there is a strong risk for her requiring prolonged mechanical ventilation".  Impression and Plan:  Nancy Berg with planned surgery to resent known breast malignancy planned in the near future. Available labs, pertinent testing, and imaging results were personally reviewed by me. This patient has been cleared by pulmonary medicine with the understanding that she is at increased (HIGH) risk for prolonged mechanical ventilation. Patient resides in a SNF and is unable to come in for a pre-operative ECG, therefore she will need to have a tracing performed on the day of surgery prior to proceeding.   Based on clinical review performed today (12/23/21), barring any significant acute changes in the patient's overall condition, it is anticipated that she will be able to proceed with the planned surgical intervention, again at increased risk of pulmonary complications and prolonged ventilatory dependency. Any acute changes in clinical condition may necessitate her procedure being postponed and/or cancelled. Patient will meet with anesthesia team (MD and/or CRNA) on this day of her procedure for preoperative evaluation/assessment.   Pre-surgical instructions were  reviewed with the patient during her PAT appointment and questions were fielded by PAT  clinical staff. Patient was advised that if any questions or concerns arise prior to her procedure then she should return a call to PAT and/or her surgeon's office to discuss.  Honor Loh, MSN, APRN, FNP-C, CEN [facility]  Peri-operative Services Nurse Practitioner Phone: [phone] 12/23/21 12:24 PM  NOTE: This note has been prepared using Dragon dictation software. Despite my best ability to proofread, there is always the potential that unintentional transcriptional errors may still occur from this process.

## 2021-12-24 NOTE — Pre-Procedure Instructions (Signed)
Called over to [facility] to make sure they had received the surgery instructions. I had to leave a voicemail and I did go over instructions and for them to call back to PAT today to confirm they had received the fax copy and if there were any other questions. I will be awaiting their call

## 2021-12-28 ENCOUNTER — Encounter: Payer: Self-pay | Admitting: General Surgery

## 2021-12-28 ENCOUNTER — Inpatient Hospital Stay: Payer: Medicare HMO | Admitting: Anesthesiology

## 2021-12-28 ENCOUNTER — Other Ambulatory Visit: Payer: Self-pay

## 2021-12-28 ENCOUNTER — Ambulatory Visit
Admission: RE | Admit: 2021-12-28 | Discharge: 2021-12-29 | Disposition: A | Discharge status: Home / Self Care | Payer: Medicare HMO | Attending: General Surgery | Admitting: General Surgery

## 2021-12-28 ENCOUNTER — Inpatient Hospital Stay: Payer: Medicare HMO

## 2021-12-28 ENCOUNTER — Encounter
Admission: RE | Disposition: A | Discharge status: Home / Self Care | Payer: Self-pay | Source: Home / Self Care | Attending: General Surgery

## 2021-12-28 DIAGNOSIS — C773 Secondary and unspecified malignant neoplasm of axilla and upper limb lymph nodes: Secondary | ICD-10-CM | POA: Insufficient documentation

## 2021-12-28 DIAGNOSIS — C50911 Malignant neoplasm of unspecified site of right female breast: Secondary | ICD-10-CM | POA: Diagnosis not present

## 2021-12-28 DIAGNOSIS — C50919 Malignant neoplasm of unspecified site of unspecified female breast: Secondary | ICD-10-CM | POA: Diagnosis present

## 2021-12-28 DIAGNOSIS — Z9981 Dependence on supplemental oxygen: Secondary | ICD-10-CM | POA: Diagnosis not present

## 2021-12-28 DIAGNOSIS — Z87891 Personal history of nicotine dependence: Secondary | ICD-10-CM | POA: Insufficient documentation

## 2021-12-28 DIAGNOSIS — C4499 Other specified malignant neoplasm of skin, unspecified: Secondary | ICD-10-CM | POA: Insufficient documentation

## 2021-12-28 DIAGNOSIS — Z171 Estrogen receptor negative status [ER-]: Secondary | ICD-10-CM | POA: Insufficient documentation

## 2021-12-28 DIAGNOSIS — I509 Heart failure, unspecified: Secondary | ICD-10-CM | POA: Insufficient documentation

## 2021-12-28 DIAGNOSIS — J449 Chronic obstructive pulmonary disease, unspecified: Secondary | ICD-10-CM | POA: Diagnosis not present

## 2021-12-28 DIAGNOSIS — Z01818 Encounter for other preprocedural examination: Secondary | ICD-10-CM

## 2021-12-28 DIAGNOSIS — I110 Hypertensive heart disease with heart failure: Secondary | ICD-10-CM | POA: Diagnosis not present

## 2021-12-28 DIAGNOSIS — Z9221 Personal history of antineoplastic chemotherapy: Secondary | ICD-10-CM | POA: Diagnosis not present

## 2021-12-28 HISTORY — PX: MASTECTOMY MODIFIED RADICAL: SHX5962

## 2021-12-28 SURGERY — MASTECTOMY, MODIFIED RADICAL
Anesthesia: General | Site: Breast | Laterality: Right

## 2021-12-28 MED ORDER — STERILE WATER FOR INJECTION IJ SOLN
INTRAMUSCULAR | Status: AC
  Filled 2021-12-28: qty 10

## 2021-12-28 MED ORDER — MIDAZOLAM HCL 2 MG/2ML IJ SOLN
INTRAMUSCULAR | Status: DC | PRN
  Administered 2021-12-28: .5 mg via INTRAVENOUS

## 2021-12-28 MED ORDER — CHLORHEXIDINE GLUCONATE 0.12 % MT SOLN: 15.0000 mL | Freq: Once | OROMUCOSAL | Status: AC

## 2021-12-28 MED ORDER — OXYCODONE HCL 5 MG PO TABS: 5.0000 mg | ORAL_TABLET | Freq: Once | ORAL | Status: AC | PRN

## 2021-12-28 MED ORDER — GLYCOPYRROLATE 0.2 MG/ML IJ SOLN
INTRAMUSCULAR | Status: DC | PRN
  Administered 2021-12-28: .2 mg via INTRAVENOUS

## 2021-12-28 MED ORDER — ALBUTEROL SULFATE (2.5 MG/3ML) 0.083% IN NEBU
2.5000 mg | INHALATION_SOLUTION | Freq: Four times a day (QID) | RESPIRATORY_TRACT | Status: DC | PRN
Start: 2021-12-28 — End: 2021-12-29

## 2021-12-28 MED ORDER — PROPOFOL 500 MG/50ML IV EMUL
INTRAVENOUS | Status: DC | PRN
  Administered 2021-12-28: 50 ug/kg/min via INTRAVENOUS

## 2021-12-28 MED ORDER — ONDANSETRON 4 MG PO TBDP: 4.0000 mg | ORAL_TABLET | Freq: Four times a day (QID) | ORAL | Status: DC | PRN

## 2021-12-28 MED ORDER — ACETAMINOPHEN 10 MG/ML IV SOLN
INTRAVENOUS | Status: DC | PRN
  Administered 2021-12-28: 640 mg via INTRAVENOUS

## 2021-12-28 MED ORDER — LIDOCAINE HCL (PF) 2 % IJ SOLN
INTRAMUSCULAR | Status: AC
  Filled 2021-12-28: qty 5

## 2021-12-28 MED ORDER — ACETAMINOPHEN 325 MG PO TABS
650.0000 mg | ORAL_TABLET | Freq: Three times a day (TID) | ORAL | Status: DC
  Administered 2021-12-28 – 2021-12-29 (×2): 650 mg via ORAL
  Filled 2021-12-28 (×3): qty 2

## 2021-12-28 MED ORDER — ACETAMINOPHEN 10 MG/ML IV SOLN
INTRAVENOUS | Status: AC
  Filled 2021-12-28: qty 100

## 2021-12-28 MED ORDER — PROPOFOL 10 MG/ML IV BOLUS
INTRAVENOUS | Status: AC
  Filled 2021-12-28: qty 60

## 2021-12-28 MED ORDER — FENTANYL CITRATE (PF) 100 MCG/2ML IJ SOLN
INTRAMUSCULAR | Status: AC
  Filled 2021-12-28: qty 2

## 2021-12-28 MED ORDER — FENTANYL CITRATE (PF) 100 MCG/2ML IJ SOLN
INTRAMUSCULAR | Status: DC | PRN
Start: 2021-12-28 — End: 2021-12-28
  Administered 2021-12-28 (×2): 25 ug via INTRAVENOUS

## 2021-12-28 MED ORDER — DEXAMETHASONE SODIUM PHOSPHATE 10 MG/ML IJ SOLN
INTRAMUSCULAR | Status: AC
  Filled 2021-12-28: qty 1

## 2021-12-28 MED ORDER — ROPIVACAINE HCL 5 MG/ML IJ SOLN
INTRAMUSCULAR | Status: AC
  Filled 2021-12-28: qty 30

## 2021-12-28 MED ORDER — PROCHLORPERAZINE MALEATE 10 MG PO TABS: 10.0000 mg | ORAL_TABLET | Freq: Four times a day (QID) | ORAL | Status: DC | PRN

## 2021-12-28 MED ORDER — ONDANSETRON HCL 4 MG/2ML IJ SOLN
INTRAMUSCULAR | Status: DC | PRN
  Administered 2021-12-28: 4 mg via INTRAVENOUS

## 2021-12-28 MED ORDER — ONDANSETRON HCL 4 MG/2ML IJ SOLN: 4.0000 mg | Freq: Once | INTRAMUSCULAR | Status: DC | PRN

## 2021-12-28 MED ORDER — VITAMIN D 25 MCG (1000 UNIT) PO TABS
2000.0000 [IU] | ORAL_TABLET | Freq: Every day | ORAL | Status: DC
  Administered 2021-12-28: 2000 [IU] via ORAL
  Filled 2021-12-28 (×2): qty 2

## 2021-12-28 MED ORDER — ORAL CARE MOUTH RINSE: 15.0000 mL | Freq: Once | OROMUCOSAL | Status: AC

## 2021-12-28 MED ORDER — SODIUM CHLORIDE 0.9 % IV SOLN
INTRAVENOUS | Status: DC | PRN
  Administered 2021-12-28: 3 ug/kg/min via INTRAVENOUS

## 2021-12-28 MED ORDER — ONDANSETRON HCL 4 MG/2ML IJ SOLN
INTRAMUSCULAR | Status: AC
  Filled 2021-12-28: qty 2

## 2021-12-28 MED ORDER — FENTANYL CITRATE (PF) 100 MCG/2ML IJ SOLN
25.0000 ug | INTRAMUSCULAR | Status: DC | PRN
  Administered 2021-12-28 (×2): 25 ug via INTRAVENOUS

## 2021-12-28 MED ORDER — OXYCODONE HCL 5 MG PO TABS
ORAL_TABLET | ORAL | Status: AC
  Filled 2021-12-28: qty 1

## 2021-12-28 MED ORDER — ENOXAPARIN SODIUM 30 MG/0.3ML IJ SOSY
30.0000 mg | PREFILLED_SYRINGE | INTRAMUSCULAR | Status: DC
Start: 2021-12-29 — End: 2021-12-29
  Administered 2021-12-29: 30 mg via SUBCUTANEOUS
  Filled 2021-12-28: qty 0.3

## 2021-12-28 MED ORDER — GUAIFENESIN ER 600 MG PO TB12: 600.0000 mg | ORAL_TABLET | Freq: Two times a day (BID) | ORAL | Status: DC | PRN

## 2021-12-28 MED ORDER — KETAMINE HCL 10 MG/ML IJ SOLN
INTRAMUSCULAR | Status: DC | PRN
  Administered 2021-12-28: 20 mg via INTRAVENOUS

## 2021-12-28 MED ORDER — LACTATED RINGERS IV SOLN: INTRAVENOUS | Status: DC

## 2021-12-28 MED ORDER — PANTOPRAZOLE SODIUM 40 MG IV SOLR
40.0000 mg | Freq: Every day | INTRAVENOUS | Status: DC
  Administered 2021-12-28: 40 mg via INTRAVENOUS
  Filled 2021-12-28: qty 10

## 2021-12-28 MED ORDER — MIDAZOLAM HCL 2 MG/2ML IJ SOLN
INTRAMUSCULAR | Status: AC
  Administered 2021-12-28: 0.5 mg via INTRAVENOUS
  Filled 2021-12-28: qty 2

## 2021-12-28 MED ORDER — CEFAZOLIN SODIUM-DEXTROSE 2-4 GM/100ML-% IV SOLN
INTRAVENOUS | Status: AC
  Filled 2021-12-28: qty 100

## 2021-12-28 MED ORDER — UMECLIDINIUM BROMIDE 62.5 MCG/ACT IN AEPB
1.0000 | INHALATION_SPRAY | Freq: Every day | RESPIRATORY_TRACT | Status: DC
Start: 2021-12-29 — End: 2021-12-29
  Filled 2021-12-28: qty 7

## 2021-12-28 MED ORDER — KETAMINE HCL 50 MG/5ML IJ SOSY
PREFILLED_SYRINGE | INTRAMUSCULAR | Status: AC
  Filled 2021-12-28: qty 5

## 2021-12-28 MED ORDER — ONDANSETRON HCL 4 MG/2ML IJ SOLN: 4.0000 mg | Freq: Four times a day (QID) | INTRAMUSCULAR | Status: DC | PRN

## 2021-12-28 MED ORDER — CEFAZOLIN SODIUM-DEXTROSE 2-4 GM/100ML-% IV SOLN
2.0000 g | Freq: Once | INTRAVENOUS | Status: AC
  Administered 2021-12-28: 1 g via INTRAVENOUS

## 2021-12-28 MED ORDER — FENTANYL CITRATE (PF) 100 MCG/2ML IJ SOLN
INTRAMUSCULAR | Status: AC
  Administered 2021-12-28: 25 ug via INTRAVENOUS
  Filled 2021-12-28: qty 2

## 2021-12-28 MED ORDER — MORPHINE SULFATE (PF) 2 MG/ML IV SOLN: 2.0000 mg | INTRAVENOUS | Status: DC | PRN

## 2021-12-28 MED ORDER — STERILE WATER FOR IRRIGATION IR SOLN
Status: DC | PRN
  Administered 2021-12-28: 1000 mL

## 2021-12-28 MED ORDER — POTASSIUM CHLORIDE CRYS ER 20 MEQ PO TBCR
20.0000 meq | EXTENDED_RELEASE_TABLET | Freq: Every day | ORAL | Status: DC
  Administered 2021-12-28: 20 meq via ORAL
  Filled 2021-12-28 (×2): qty 1

## 2021-12-28 MED ORDER — DEXMEDETOMIDINE (PRECEDEX) IN NS 20 MCG/5ML (4 MCG/ML) IV SYRINGE
PREFILLED_SYRINGE | INTRAVENOUS | Status: DC | PRN
  Administered 2021-12-28: 4 ug via INTRAVENOUS
  Administered 2021-12-28: 8 ug via INTRAVENOUS

## 2021-12-28 MED ORDER — BUPIVACAINE-EPINEPHRINE (PF) 0.25% -1:200000 IJ SOLN
INTRAMUSCULAR | Status: AC
  Filled 2021-12-28: qty 30

## 2021-12-28 MED ORDER — CHLORHEXIDINE GLUCONATE 0.12 % MT SOLN
OROMUCOSAL | Status: AC
  Administered 2021-12-28: 15 mL via OROMUCOSAL
  Filled 2021-12-28: qty 15

## 2021-12-28 MED ORDER — CALCIUM CARBONATE ANTACID 500 MG PO CHEW: 1.0000 | CHEWABLE_TABLET | Freq: Every day | ORAL | Status: DC | PRN

## 2021-12-28 MED ORDER — OXYCODONE HCL 5 MG/5ML PO SOLN
5.0000 mg | Freq: Once | ORAL | Status: AC | PRN
  Administered 2021-12-28: 5 mg via ORAL

## 2021-12-28 MED ORDER — ROPIVACAINE HCL 5 MG/ML IJ SOLN
INTRAMUSCULAR | Status: DC | PRN
  Administered 2021-12-28: 20 mL

## 2021-12-28 MED ORDER — ALENDRONATE SODIUM 70 MG PO TABS: 70.0000 mg | ORAL_TABLET | ORAL | Status: DC

## 2021-12-28 MED ORDER — PHENYLEPHRINE HCL-NACL 20-0.9 MG/250ML-% IV SOLN
INTRAVENOUS | Status: DC | PRN
  Administered 2021-12-28: 30 ug/min via INTRAVENOUS

## 2021-12-28 MED ORDER — METOPROLOL SUCCINATE ER 25 MG PO TB24
25.0000 mg | ORAL_TABLET | Freq: Every day | ORAL | Status: DC
  Filled 2021-12-28: qty 1

## 2021-12-28 MED ORDER — BUDESONIDE 0.5 MG/2ML IN SUSP
0.5000 mg | Freq: Every day | RESPIRATORY_TRACT | Status: DC
Start: 2021-12-29 — End: 2021-12-29
  Administered 2021-12-29: 0.5 mg via RESPIRATORY_TRACT
  Filled 2021-12-28: qty 2

## 2021-12-28 MED ORDER — TRAMADOL HCL 50 MG PO TABS: 50.0000 mg | ORAL_TABLET | Freq: Four times a day (QID) | ORAL | Status: DC | PRN

## 2021-12-28 MED ORDER — DEXAMETHASONE SODIUM PHOSPHATE 4 MG/ML IJ SOLN
INTRAMUSCULAR | Status: DC | PRN
  Administered 2021-12-28: 3 mg

## 2021-12-28 MED ORDER — MIDAZOLAM HCL 2 MG/2ML IJ SOLN
INTRAMUSCULAR | Status: AC
  Filled 2021-12-28: qty 2

## 2021-12-28 MED ORDER — MIDAZOLAM HCL 2 MG/2ML IJ SOLN: 0.5000 mg | Freq: Once | INTRAMUSCULAR | Status: AC

## 2021-12-28 SURGICAL SUPPLY — 48 items
ADH SKN CLS APL DERMABOND .7 (GAUZE/BANDAGES/DRESSINGS) ×1
APL PRP STRL LF DISP 70% ISPRP (MISCELLANEOUS) ×1
BINDER BREAST MEDIUM (GAUZE/BANDAGES/DRESSINGS) ×1 IMPLANT
BULB RESERV EVAC DRAIN JP 100C (MISCELLANEOUS) ×3 IMPLANT
CHLORAPREP W/TINT 26 (MISCELLANEOUS) ×2 IMPLANT
DERMABOND ADVANCED (GAUZE/BANDAGES/DRESSINGS) ×1
DERMABOND ADVANCED .7 DNX12 (GAUZE/BANDAGES/DRESSINGS) ×1 IMPLANT
DRAIN CHANNEL JP 15F RND 16 (MISCELLANEOUS) ×3 IMPLANT
DRAPE LAPAROTOMY TRNSV 106X77 (MISCELLANEOUS) ×2 IMPLANT
DRSG GAUZE FLUFF 36X18 (GAUZE/BANDAGES/DRESSINGS) ×2 IMPLANT
ELECT REM PT RETURN 9FT ADLT (ELECTROSURGICAL) ×2
ELECTRODE REM PT RTRN 9FT ADLT (ELECTROSURGICAL) ×1 IMPLANT
GAUZE 4X4 16PLY ~~LOC~~+RFID DBL (SPONGE) ×2 IMPLANT
GAUZE SPONGE 4X4 12PLY STRL (GAUZE/BANDAGES/DRESSINGS) ×2 IMPLANT
GLOVE BIO SURGEON STRL SZ 6.5 (GLOVE) ×2 IMPLANT
GLOVE BIOGEL PI IND STRL 6.5 (GLOVE) ×1 IMPLANT
GLOVE BIOGEL PI INDICATOR 6.5 (GLOVE) ×1
GOWN STRL REUS W/ TWL LRG LVL3 (GOWN DISPOSABLE) ×2 IMPLANT
GOWN STRL REUS W/TWL LRG LVL3 (GOWN DISPOSABLE) ×4
KIT TURNOVER KIT A (KITS) ×2 IMPLANT
LABEL OR SOLS (LABEL) ×2 IMPLANT
MANIFOLD NEPTUNE II (INSTRUMENTS) ×2 IMPLANT
MARGIN MAP 10MM (MISCELLANEOUS) ×1 IMPLANT
PACK BASIN MAJOR ARMC (MISCELLANEOUS) ×2 IMPLANT
PAD ABD DERMACEA PRESS 5X9 (GAUZE/BANDAGES/DRESSINGS) ×2 IMPLANT
PROBE BIOSP ALOKA ALPHA6 PROST (MISCELLANEOUS) IMPLANT
SLEVE PROBE SENORX GAMMA FIND (MISCELLANEOUS) ×1 IMPLANT
SPONGE T-LAP 18X18 ~~LOC~~+RFID (SPONGE) ×4 IMPLANT
STAPLER SKIN PROX 35W (STAPLE) ×1 IMPLANT
SUT ETHILON 3-0 FS-10 30 BLK (SUTURE) ×2
SUT ETHILON 4-0 (SUTURE)
SUT ETHILON 4-0 FS2 18XMFL BLK (SUTURE)
SUT MNCRL 4-0 (SUTURE)
SUT MNCRL 4-0 27XMFL (SUTURE)
SUT SILK 2 0 (SUTURE) ×2
SUT SILK 2 0 PERMA HAND 18 BK (SUTURE) ×1 IMPLANT
SUT SILK 2 0 SH (SUTURE) IMPLANT
SUT SILK 2-0 18XBRD TIE 12 (SUTURE) IMPLANT
SUT SILK 3-0 (SUTURE) ×1 IMPLANT
SUT VIC AB 3-0 54X BRD REEL (SUTURE) ×1 IMPLANT
SUT VIC AB 3-0 BRD 54 (SUTURE)
SUT VIC AB 3-0 SH 27 (SUTURE) ×4
SUT VIC AB 3-0 SH 27X BRD (SUTURE) ×2 IMPLANT
SUT VIC AB 3-0 SH 8-18 (SUTURE) ×1 IMPLANT
SUTURE EHLN 3-0 FS-10 30 BLK (SUTURE) ×1 IMPLANT
SUTURE ETHLN 4-0 FS2 18XMF BLK (SUTURE) IMPLANT
SUTURE MNCRL 4-0 27XMF (SUTURE) ×2 IMPLANT
WATER STERILE IRR 500ML POUR (IV SOLUTION) ×2 IMPLANT

## 2021-12-28 NOTE — Anesthesia Procedure Notes (Addendum)
Anesthesia Regional Block: Other (Erector Spinae Plane Block)   Pre-Anesthetic Checklist: , timeout performed,  Correct Patient, Correct Site, Correct Laterality,  Correct Procedure, Correct Position, site marked,  Risks and benefits discussed,  Surgical consent,  Pre-op evaluation,  At surgeon's request and post-op pain management  Laterality: Right  Prep: chloraprep       Needles:  Injection technique: Single-shot  Needle Type: Echogenic Needle     Needle Length: 9cm  Needle Gauge: 21     Additional Needles:   Procedures:,,,, ultrasound used (permanent image in chart),,    Narrative:  Injection made incrementally with aspirations every 5 mL.  Performed by: Personally  Anesthesiologist: Arita Miss, MD  Additional Notes: Patient's chart reviewed and they were deemed appropriate candidate for procedure, at surgeon's request. Patient educated about risks, benefits, and alternatives of the block including but not limited to: temporary or permanent nerve damage, bleeding, infection, damage to surround tissues, pneumothorax, block failure, local anesthetic toxicity. Patient expressed understanding. A formal time-out was conducted consistent with institution rules.  Monitors were applied, and minimal sedation used (see nursing record). The site was prepped with skin prep and allowed to dry, and sterile gloves were used. A high frequency linear ultrasound probe with probe cover was utilized throughout. T4 transverse process located with careful needle advancement until bone, and echogenic block needle trajectory was monitored throughout. Aspiration performed every 60m. Visualization of local anesthetic spread in plane under erector spinae muscles. Lung and blood vessels were avoided. All injections were performed without resistance and free of blood and paresthesias. The patient tolerated the procedure well.  Injectate: Decadron '3mg'$  + 345m0.33% ropivacaine ('100mg'$  of local anesthetic  total which is well within the toxic dose for this patient at '3mg'$ /kg would be 129kg)

## 2021-12-28 NOTE — Interval H&P Note (Signed)
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  The various methods of treatment have been discussed with the patient and family. After consideration of risks, benefits and other options for treatment, the patient has consented to  Procedure(s): MASTECTOMY MODIFIED RADICAL (Right) as a surgical intervention.  The patient's history has been reviewed, patient examined, no change in status, stable for surgery.  I have reviewed the patient's chart and labs.  Questions were answered to the patient's satisfaction.     Herbert Pun

## 2021-12-28 NOTE — Transfer of Care (Signed)
Immediate Anesthesia Transfer of Care Note  Patient: Lillis Nuttle  Procedure(s) Performed: MASTECTOMY MODIFIED RADICAL (Right: Breast)  Patient Location: PACU  Anesthesia Type:General  Level of Consciousness: drowsy  Airway & Oxygen Therapy: Patient Spontanous Breathing and Patient connected to face mask  Post-op Assessment: Report given to RN and Post -op Vital signs reviewed and stable  Post vital signs: Reviewed and stable  Last Vitals:  Vitals Value Taken Time  BP    Temp 36.8 C 12/28/21 1022  Pulse 69 12/28/21 1026  Resp 24 12/28/21 1026  SpO2 99 % 12/28/21 1026    Last Pain:  Vitals:   12/28/21 0807  TempSrc:   PainSc: 0-No pain         Complications: No notable events documented.

## 2021-12-28 NOTE — Op Note (Signed)
Preoperative diagnosis: Carcinoma of the right breast.   Postoperative diagnosis: Carcinoma of the right breast.  Procedure: Right modified radical mastectomy.  Anesthesia: GETA  Surgeon: Dr. Windell Moment  Wound Classification: Clean   Indications: Patient is a 76 y.o. female who had an abnormal mammogram that on workup with core needle biopsy was found to be invasive ductal carcinoma of the breast. She was demonstrated to have axillary node involvement. After discussion of alternatives, the patient would benefit best of modified radical mastectomy as oncologic treatment.   Findings: 1. Palpable clinical axillary adenopathy.   Description of procedure: The patient was brought to the operating room and the site of surgery was confirmed. General anesthesia was induced. A time-out was completed verifying correct patient, procedure, site, positioning, and implant(s) and/or special equipment prior to beginning this procedure. The breast, chest wall, axilla, and upper arm and neck were prepped and draped in the usual sterile fashion.  A skin incision was made that encompassed the nipple-areola complex and the previous biopsy scar and passed in a generally oblique direction across the breast. Flaps were raised in the avascular plane between subcutaneous tissue and breast tissue from clavicle superiorly, the sternum medially, the anterior rectus sheath inferiorly, and the anterior border of the latissimus dorsi muscle laterally. Hemostasis was achieved in the flaps. Next, the breast tissue and underlying pectoralis fascia were excised from the pectoralis major muscle, progressing from medially to laterally. At the lateral border of the pectoralis major muscle, the breast tissue was swung laterally and dissection progressed under the muscle.  The clavipectoral fascia was then incised along the edge of the pectoralis major and the pectoralis major and minor were freed from surrounding fat and nodal tissue.  Dissection progressed first under the pectoralis major and then under the pectoralis minor muscle. The pectoral muscles were retracted medially with a Richardson retractor. The medial pectoral neurovascular bundle was identified and preserved. The level II nodal tissue deep in the pectoralis minor was included in the dissection. The axillary vein was then identified and cleared of overlying fat. The first branch off the axillary vein was clamped, divided, and tied with 2-0 silk ties. The thoracodorsal nerve was then identified deep to the ligated vein and preserved. The long thoracic nerve was then identified along the edge of the latissimus dorsi on the chest wall and preserved. The remaining nodal tissue between these nerves was then carefully removed, taking care to protect the nerves. The specimen, consisting of breast and attached nodal tissue, was then excised by dividing the remaining lateral pedical and sent to pathology.  The wound was irrigated and hemostasis was achieved.  A closed suction drain was brought into the operative field through a separate stab incision and sutured to the skin with a 3-0 nylon suture. The wound was closed with interrupted 3-0 Vicryl to the subcutaneous layer, followed by skin staples. The wound was dressed.  The patient tolerated the procedure well and was taken to the postanesthesia care unit in stable condition.   Sentinel Node Biopsy Synoptic Operative Report  Operation performed with curative intent:Yes  Tracer(s) used to identify sentinel nodes in the upfront surgery (non-neoadjuvant) setting (select all that apply):N/A  Tracer(s) used to identify sentinel nodes in the neoadjuvant setting (select all that apply):N/A  All nodes (colored or non-colored) present at the end of a dye-filled lymphatic channel were removed:N/A  All significantly radioactive nodes were removed:N/A  All palpable suspicious nodes were removed:Yes  Biopsy-proven positive nodes  marked with  clips prior to chemotherapy were identified and removed:N/A  Specimen: Right modified radical mastectomy  Complications: None  Estimated Blood Loss: 10  mL

## 2021-12-28 NOTE — Anesthesia Postprocedure Evaluation (Signed)
Anesthesia Post Note  Patient: Nancy Berg  Procedure(s) Performed: MASTECTOMY MODIFIED RADICAL (Right: Breast)  Patient location during evaluation: PACU Anesthesia Type: General Level of consciousness: awake and alert Pain management: pain level controlled Vital Signs Assessment: post-procedure vital signs reviewed and stable Respiratory status: spontaneous breathing, nonlabored ventilation, respiratory function stable and patient connected to nasal cannula oxygen Cardiovascular status: blood pressure returned to baseline and stable Postop Assessment: no apparent nausea or vomiting Anesthetic complications: no   No notable events documented.   Last Vitals:  Vitals:   12/28/21 1030 12/28/21 1045  BP: 112/60 (!) 107/57  Pulse: 67 (!) 53  Resp: (!) 22 16  Temp:    SpO2: 99% (!) 87%    Last Pain:  Vitals:   12/28/21 1045  TempSrc:   PainSc: 4                  Arita Miss

## 2021-12-28 NOTE — Progress Notes (Signed)
PHARMACIST - PHYSICIAN COMMUNICATION  CONCERNING:  Enoxaparin (Lovenox) for DVT Prophylaxis   DESCRIPTION: Patient was prescribed enoxaprin '40mg'$  q24 hours for VTE prophylaxis.   Filed Weights   12/28/21 0741 12/28/21 0807  Weight: 46.3 kg (102 lb) 43.1 kg (95 lb)    Body mass index is 17.38 kg/m.  CrCl cannot be calculated (Patient's most recent lab result is older than the maximum 21 days allowed.).   Patient is candidate for enoxaparin '30mg'$  every 24 hours based on CrCl <79m/min or Weight <45kg  RECOMMENDATION: [facility]  Patient is now receiving enoxaparin 30 mg every 24 hours    MDarnelle Bos PharmD Clinical Pharmacist  12/28/2021 1:55 PM

## 2021-12-28 NOTE — Anesthesia Procedure Notes (Signed)
Procedure Name: LMA Insertion Date/Time: 12/28/2021 9:27 AM Performed by: Loletha Grayer, CRNA Pre-anesthesia Checklist: Patient identified, Patient being monitored, Timeout performed, Emergency Drugs available and Suction available Patient Re-evaluated:Patient Re-evaluated prior to induction Oxygen Delivery Method: Circle system utilized Preoxygenation: Pre-oxygenation with 100% oxygen Induction Type: IV induction Ventilation: Mask ventilation without difficulty LMA: LMA inserted LMA Size: 3.0 Number of attempts: 1 Placement Confirmation: positive ETCO2 Tube secured with: Tape Dental Injury: Teeth and Oropharynx as per pre-operative assessment

## 2021-12-29 ENCOUNTER — Encounter: Payer: Self-pay | Admitting: General Surgery

## 2021-12-29 DIAGNOSIS — C50911 Malignant neoplasm of unspecified site of right female breast: Secondary | ICD-10-CM | POA: Diagnosis not present

## 2021-12-29 LAB — BASIC METABOLIC PANEL
Anion gap: 9 (ref 5–15)
BUN: 17 mg/dL (ref 8–23)
CO2: 28 mmol/L (ref 22–32)
Calcium: 9.2 mg/dL (ref 8.9–10.3)
Chloride: 100 mmol/L (ref 98–111)
Creatinine, Ser: 0.4 mg/dL — ABNORMAL LOW (ref 0.44–1.00)
GFR, Estimated: >60 mL/min (ref >60)
Glucose, Bld: 87 mg/dL (ref 70–99)
Potassium: 4 mmol/L (ref 3.5–5.1)
Sodium: 137 mmol/L (ref 135–145)

## 2021-12-29 LAB — CBC
HCT: 40.2 % (ref 36.0–46.0)
Hemoglobin: 12.9 g/dL (ref 12.0–15.0)
MCH: 29.1 pg (ref 26.0–34.0)
MCHC: 32.1 g/dL (ref 30.0–36.0)
MCV: 90.7 fL (ref 80.0–100.0)
Platelets: 265 10*3/uL (ref 150–400)
RBC: 4.43 MIL/uL (ref 3.87–5.11)
RDW: 14.6 % (ref 11.5–15.5)
WBC: 12.9 10*3/uL — ABNORMAL HIGH (ref 4.0–10.5)
nRBC: 0 % (ref 0.0–0.2)

## 2021-12-29 MED ORDER — TRAMADOL HCL 50 MG PO TABS: 50.0000 mg | ORAL_TABLET | Freq: Four times a day (QID) | ORAL | 0 refills | Status: DC | PRN

## 2021-12-29 NOTE — Plan of Care (Signed)
  Problem: Clinical Measurements: Goal: Ability to maintain clinical measurements within normal limits will improve Outcome: Progressing Goal: Respiratory complications will improve Outcome: Progressing Goal: Cardiovascular complication will be avoided Outcome: Progressing   Problem: Activity: Goal: Risk for activity intolerance will decrease Outcome: Progressing   

## 2021-12-29 NOTE — Progress Notes (Signed)
Discharge instructions reviewed with patient, son and daughter including followup visits and new medications.  JP drain teaching provided (written and verbal) along with drainage record form.  Understanding was verbalized and all questions were answered.  IV removed without complication; patient tolerated well.  Patient discharged back to ALF via wheelchair in stable condition escorted by nursing staff.

## 2021-12-29 NOTE — NC FL2 (Signed)
[place] LEVEL OF CARE SCREENING TOOL     IDENTIFICATION  Patient Name: Nancy Berg Birthdate: 10-19-1945 Sex: female Admission Date (Current Location): 12/28/2021  [facility] and [place] Number:  Engineering geologist and Address:  [facility], [address], [place], [place] [address]      Provider Number: [id number]  Attending Physician Name and Address:  Herbert Pun, MD  Relative Name and Phone Number:       Current Level of Care: Hospital Recommended Level of Care: [place] (with RN through [place]) Prior Approval Number:    Date Approved/Denied:   PASRR Number:    Discharge Plan: Other (Comment) (ALF wih RN through [place])    Current Diagnoses: Patient Active Problem List   Diagnosis Date Noted   Breast cancer ([place]) 12/28/2021   Other vitamin B12 deficiency anemias 11/15/2021   Osteoarthritis 11/15/2021   Malignant neoplasm of upper-outer quadrant of right breast in female, estrogen receptor negative ([place]) 10/27/2021   Goals of care, counseling/discussion 10/27/2021   Vitamin D deficiency 02/06/2020   Mass of neck 02/06/2020   Age-related osteoporosis without current pathological fracture 02/06/2020   Chronic obstructive pulmonary disease, unspecified ([place]) 02/06/2020    Orientation RESPIRATION BLADDER Height & Weight     Self, Time, Situation, Place  O2 (Nasal Cannula 2 L) Continent Weight: 95 lb (43.1 kg) Height:  '5\' 2"'$  (157.5 cm)  BEHAVIORAL SYMPTOMS/MOOD NEUROLOGICAL BOWEL NUTRITION STATUS   (None)  (None) Continent Diet (Low sodium, heart healthy)  AMBULATORY STATUS COMMUNICATION OF NEEDS Skin     Verbally Surgical wounds (Incision right breast: ABD pads)                       Personal Care Assistance Level of Assistance              Functional Limitations Info  Sight, Hearing, Speech Sight Info: Adequate Hearing Info: Adequate Speech Info: Adequate    SPECIAL CARE  FACTORS FREQUENCY                       Contractures Contractures Info: Not present    Additional Factors Info  Code Status, Allergies Code Status Info: Full code Allergies Info: Penicillins           Current Medications (12/29/2021):  This is the current hospital active medication list Current Facility-Administered Medications  Medication Dose Route Frequency Provider Last Rate Last Admin   acetaminophen (TYLENOL) tablet 650 mg  650 mg Oral TID Herbert Pun, MD   650 mg at 12/29/21 0937   albuterol (PROVENTIL) (2.5 MG/3ML) 0.083% nebulizer solution 2.5 mg  2.5 mg Inhalation Q6H PRN Herbert Pun, MD       budesonide (PULMICORT) nebulizer solution 0.5 mg  0.5 mg Inhalation Daily Herbert Pun, MD   0.5 mg at 12/29/21 0743   calcium carbonate (TUMS - dosed in mg elemental calcium) chewable tablet 200 mg of elemental calcium  1 tablet Oral Daily PRN Herbert Pun, MD       cholecalciferol (VITAMIN D3) tablet 2,000 Units  2,000 Units Oral Daily Herbert Pun, MD   2,000 Units at 12/28/21 1424   enoxaparin (LOVENOX) injection 30 mg  30 mg Subcutaneous Q24H Herbert Pun, MD   30 mg at 12/29/21 0937   guaiFENesin (MUCINEX) 12 hr tablet 600 mg  600 mg Oral BID PRN Herbert Pun, MD       metoprolol succinate (TOPROL-XL) 24 hr tablet  25 mg  25 mg Oral Daily Herbert Pun, MD       morphine (PF) 2 MG/ML injection 2 mg  2 mg Intravenous Q4H PRN Herbert Pun, MD       ondansetron (ZOFRAN-ODT) disintegrating tablet 4 mg  4 mg Oral Q6H PRN Herbert Pun, MD       Or   ondansetron (ZOFRAN) injection 4 mg  4 mg Intravenous Q6H PRN Herbert Pun, MD       pantoprazole (PROTONIX) injection 40 mg  40 mg Intravenous QHS Herbert Pun, MD   40 mg at 12/28/21 2217   potassium chloride SA (KLOR-CON M) CR tablet 20 mEq  20 mEq Oral Daily Herbert Pun, MD   20 mEq at 12/28/21 1424    prochlorperazine (COMPAZINE) tablet 10 mg  10 mg Oral Q6H PRN Herbert Pun, MD       traMADol (ULTRAM) tablet 50 mg  50 mg Oral Q6H PRN Herbert Pun, MD       umeclidinium bromide (INCRUSE ELLIPTA) 62.5 MCG/ACT 1 puff  1 puff Inhalation Daily Herbert Pun, MD         Discharge Medications: TAKE these medications     acetaminophen 325 MG tablet Commonly known as: TYLENOL Take 650 mg by mouth See admin instructions. Take 2 tablets (650 mg) by mouth 3 times daily scheduled (0800, 1400 & 2000) & take 2 tablets (650 mg) by mouth up to twice daily as needed for pain.    Albuterol Sulfate 2.5 MG/0.5ML Nebu Inhale into the lungs as needed.    albuterol 108 (90 Base) MCG/ACT inhaler Commonly known as: VENTOLIN HFA Inhale 2 puffs into the lungs every 6 (six) hours as needed for shortness of breath or wheezing.    alendronate 70 MG tablet Commonly known as: FOSAMAX Take 70 mg by mouth every Monday.    ammonium lactate 12 % lotion Commonly known as: LAC-HYDRIN Apply 1 application. topically daily. (0800) Lower legs.    atorvastatin 20 MG tablet Commonly known as: LIPITOR Take 20 mg by mouth daily. (0800)    budesonide 0.5 MG/2ML nebulizer solution Commonly known as: PULMICORT Inhale 0.5 mg into the lungs daily. (0800)    calcium carbonate 750 MG chewable tablet Commonly known as: TUMS EX Chew 1 tablet by mouth in the morning and at bedtime. (0800 & 2000)    guaiFENesin 600 MG 12 hr tablet Commonly known as: MUCINEX Take 600 mg by mouth 2 (two) times daily as needed (congestion.).    HEALTHY EYES SUPERVISION 2 PO Take 1 capsule by mouth in the morning and at bedtime. (0730 & 1730)    Incruse Ellipta 62.5 MCG/ACT Aepb Generic drug: umeclidinium bromide Inhale 1 puff into the lungs daily. (0800)    lidocaine-prilocaine cream Commonly known as: EMLA Apply to affected area once    Lumigan 0.01 % Soln Generic drug: bimatoprost Place 1 drop into both  eyes at bedtime. (2000)    metoprolol succinate 25 MG 24 hr tablet Commonly known as: TOPROL-XL Take 25 mg by mouth daily. (0800) Hold if SBP<100 or Pulse <60.    ondansetron 8 MG tablet Commonly known as: Zofran Take 1 tablet (8 mg total) by mouth 2 (two) times daily as needed (Nausea or vomiting).    OXYGEN Inhale 2 L/min into the lungs as needed (shortness of breath OR if pulse ox is <90% on room air).    potassium chloride SA 20 MEQ tablet Commonly known as: KLOR-CON M Take 20 mEq by mouth  daily. (0800)    prochlorperazine 10 MG tablet Commonly known as: COMPAZINE Take 1 tablet (10 mg total) by mouth every 6 (six) hours as needed (Nausea or vomiting).    traMADol 50 MG tablet Commonly known as: Ultram Take 1 tablet (50 mg total) by mouth every 6 (six) hours as needed.    Vitamin D3 50 MCG (2000 UT) Tabs Take 2,000 Units by mouth daily. (0800)    Relevant Imaging Results:  Relevant Lab Results:   Additional Information SS#: [id number]  Candie Chroman, LCSW

## 2021-12-29 NOTE — Discharge Summary (Signed)
Patient ID: Deonne Rooks MRN: [id number] DOB/AGE: 76/15/1947 76 y.o.  Admit date: 12/28/2021 Discharge date: 12/29/2021   Discharge Diagnoses:  Principal Problem:   Breast cancer [facility])   Procedures: Right modified radical mastectomy  Hospital Course: Patient with right breast cancer.  She had right modified mastectomy.  She has has been tolerating the procedure well.  The patient without pain.  Incision is healthy.  Drain with serosanguineous fluid.  Physical Exam Vitals reviewed.  Constitutional:      Appearance: Normal appearance.  HENT:     Head: Normocephalic.  Cardiovascular:     Rate and Rhythm: Normal rate and regular rhythm.  Pulmonary:     Effort: Pulmonary effort is normal.  Chest:  Breasts:    Right: Absent.  Abdominal:     General: Abdomen is flat.  Musculoskeletal:     Cervical back: Normal range of motion.  Neurological:     Mental Status: She is alert.     Consults: None  Disposition: Discharge disposition: 01-Home or Self Care       Discharge Instructions     Diet - low sodium heart healthy   Complete by: As directed    Increase activity slowly   Complete by: As directed       Allergies as of 12/29/2021       Reactions   Penicillins Rash, Other (See Comments)        Medication List     TAKE these medications    acetaminophen 325 MG tablet Commonly known as: TYLENOL Take 650 mg by mouth See admin instructions. Take 2 tablets (650 mg) by mouth 3 times daily scheduled (0800, 1400 & 2000) & take 2 tablets (650 mg) by mouth up to twice daily as needed for pain.   Albuterol Sulfate 2.5 MG/0.5ML Nebu Inhale into the lungs as needed.   albuterol 108 (90 Base) MCG/ACT inhaler Commonly known as: VENTOLIN HFA Inhale 2 puffs into the lungs every 6 (six) hours as needed for shortness of breath or wheezing.   alendronate 70 MG tablet Commonly known as: FOSAMAX Take 70 mg by mouth every Monday.   ammonium lactate 12 %  lotion Commonly known as: LAC-HYDRIN Apply 1 application. topically daily. (0800) Lower legs.   atorvastatin 20 MG tablet Commonly known as: LIPITOR Take 20 mg by mouth daily. (0800)   budesonide 0.5 MG/2ML nebulizer solution Commonly known as: PULMICORT Inhale 0.5 mg into the lungs daily. (0800)   calcium carbonate 750 MG chewable tablet Commonly known as: TUMS EX Chew 1 tablet by mouth in the morning and at bedtime. (0800 & 2000)   guaiFENesin 600 MG 12 hr tablet Commonly known as: MUCINEX Take 600 mg by mouth 2 (two) times daily as needed (congestion.).   HEALTHY EYES SUPERVISION 2 PO Take 1 capsule by mouth in the morning and at bedtime. (0730 & 1730)   Incruse Ellipta 62.5 MCG/ACT Aepb Generic drug: umeclidinium bromide Inhale 1 puff into the lungs daily. (0800)   lidocaine-prilocaine cream Commonly known as: EMLA Apply to affected area once   Lumigan 0.01 % Soln Generic drug: bimatoprost Place 1 drop into both eyes at bedtime. (2000)   metoprolol succinate 25 MG 24 hr tablet Commonly known as: TOPROL-XL Take 25 mg by mouth daily. (0800) Hold if SBP<100 or Pulse <60.   ondansetron 8 MG tablet Commonly known as: Zofran Take 1 tablet (8 mg total) by mouth 2 (two) times daily as needed (Nausea or vomiting).   OXYGEN Inhale 2  L/min into the lungs as needed (shortness of breath OR if pulse ox is <90% on room air).   potassium chloride SA 20 MEQ tablet Commonly known as: KLOR-CON M Take 20 mEq by mouth daily. (0800)   prochlorperazine 10 MG tablet Commonly known as: COMPAZINE Take 1 tablet (10 mg total) by mouth every 6 (six) hours as needed (Nausea or vomiting).   traMADol 50 MG tablet Commonly known as: Ultram Take 1 tablet (50 mg total) by mouth every 6 (six) hours as needed.   Vitamin D3 50 MCG (2000 UT) Tabs Take 2,000 Units by mouth daily. (0800)        Follow-up Information     Herbert Pun, MD Follow up in 1 week(s).   Specialty:  General Surgery Why: For wound re-check Contact information: [place] [address] [phone]

## 2021-12-29 NOTE — Discharge Instructions (Signed)
  Diet: Resume home heart healthy regular diet.   Activity: No heavy lifting >20 pounds (children, pets, laundry, garbage) or strenuous activity until follow-up, but light activity and walking are encouraged. Do not drive or drink alcohol if taking narcotic pain medications.  Wound care: May shower with soapy water and pat dry (do not rub incisions) or use a cloth with soap and water, but no baths or submerging incision underwater until follow-up. (no swimming)   Chart drain output daily  Medications: Resume all home medications. For mild to moderate pain: acetaminophen (Tylenol) or ibuprofen (if no kidney disease). Combining Tylenol with alcohol can substantially increase your risk of causing liver disease. Narcotic pain medications, if prescribed, can be used for severe pain, though may cause nausea, constipation, and drowsiness. If you do not need the narcotic pain medication, you do not need to fill the prescription.  Call office [phone]) at any time if any questions, worsening pain, fevers/chills, bleeding, drainage from incision site, or other concerns.

## 2021-12-29 NOTE — Plan of Care (Signed)

## 2021-12-29 NOTE — TOC Initial Note (Signed)
Transition of Care [facility]) - Initial/Assessment Note    Patient Details  Name: Nancy Berg MRN: [id number] Date of Birth: Jan 17, 1946  Transition of Care [facility]) CM/SW Contact:    Beverly Sessions, RN Phone Number: 12/29/2021, 9:09 AM  Clinical Narrative:                   Transition of Care [facility]) Screening Note   Patient Details  Name: Nancy Berg Date of Birth: 11-24-1945   Transition of Care [facility]) CM/SW Contact:    Beverly Sessions, RN Phone Number: 12/29/2021, 9:09 AM    Transition of Care Department [facility]) has reviewed patient and no TOC needs have been identified at this time. We will continue to monitor patient advancement through interdisciplinary progression rounds. If new patient transition needs arise, please place a TOC consult.         Patient Goals and CMS Choice        Expected Discharge Plan and Services           Expected Discharge Date: 12/29/21                                    Prior Living Arrangements/Services                       Activities of Daily Living      Permission Sought/Granted                  Emotional Assessment              Admission diagnosis:  Breast cancer [facility]) [C50.919] Patient Active Problem List   Diagnosis Date Noted   Breast cancer ([place]) 12/28/2021   Other vitamin B12 deficiency anemias 11/15/2021   Osteoarthritis 11/15/2021   Malignant neoplasm of upper-outer quadrant of right breast in female, estrogen receptor negative ([place]) 10/27/2021   Goals of care, counseling/discussion 10/27/2021   Vitamin D deficiency 02/06/2020   Mass of neck 02/06/2020   Age-related osteoporosis without current pathological fracture 02/06/2020   Chronic obstructive pulmonary disease, unspecified ([place]) 02/06/2020   PCP:  Verl Blalock, NP Pharmacy:   [place], [address]. [place] [address] Phone: [phone] Fax:  [phone]     Social Determinants of Health (SDOH) Interventions    Readmission Risk Interventions     View : No data to display.

## 2021-12-31 ENCOUNTER — Other Ambulatory Visit: Payer: Self-pay | Admitting: Pathology

## 2021-12-31 LAB — SURGICAL PATHOLOGY

## 2022-01-07 MED FILL — Dexamethasone Sodium Phosphate Inj 100 MG/10ML: INTRAMUSCULAR | Qty: 1 | Status: AC

## 2022-01-10 ENCOUNTER — Inpatient Hospital Stay: Payer: Medicare HMO | Attending: Oncology | Admitting: Oncology

## 2022-01-10 ENCOUNTER — Encounter: Payer: Self-pay | Admitting: Oncology

## 2022-01-10 ENCOUNTER — Inpatient Hospital Stay: Payer: Medicare HMO

## 2022-01-10 VITALS — BP 112/69 | HR 96 | Temp 97.9°F | Resp 16 | Ht 62.0 in | Wt 98.3 lb

## 2022-01-10 DIAGNOSIS — Z7189 Other specified counseling: Secondary | ICD-10-CM

## 2022-01-10 DIAGNOSIS — Z5111 Encounter for antineoplastic chemotherapy: Secondary | ICD-10-CM | POA: Insufficient documentation

## 2022-01-10 DIAGNOSIS — C773 Secondary and unspecified malignant neoplasm of axilla and upper limb lymph nodes: Secondary | ICD-10-CM | POA: Diagnosis not present

## 2022-01-10 DIAGNOSIS — J449 Chronic obstructive pulmonary disease, unspecified: Secondary | ICD-10-CM | POA: Insufficient documentation

## 2022-01-10 DIAGNOSIS — C50411 Malignant neoplasm of upper-outer quadrant of right female breast: Secondary | ICD-10-CM | POA: Diagnosis present

## 2022-01-10 DIAGNOSIS — Z9981 Dependence on supplemental oxygen: Secondary | ICD-10-CM | POA: Insufficient documentation

## 2022-01-10 DIAGNOSIS — I509 Heart failure, unspecified: Secondary | ICD-10-CM | POA: Diagnosis not present

## 2022-01-10 DIAGNOSIS — Z9011 Acquired absence of right breast and nipple: Secondary | ICD-10-CM | POA: Insufficient documentation

## 2022-01-10 DIAGNOSIS — Z87891 Personal history of nicotine dependence: Secondary | ICD-10-CM | POA: Diagnosis not present

## 2022-01-10 DIAGNOSIS — Z171 Estrogen receptor negative status [ER-]: Secondary | ICD-10-CM | POA: Insufficient documentation

## 2022-01-10 DIAGNOSIS — F0390 Unspecified dementia without behavioral disturbance: Secondary | ICD-10-CM | POA: Diagnosis not present

## 2022-01-10 LAB — COMPREHENSIVE METABOLIC PANEL
ALT: 12 U/L (ref 0–44)
AST: 22 U/L (ref 15–41)
Albumin: 3.5 g/dL (ref 3.5–5.0)
Alkaline Phosphatase: 77 U/L (ref 38–126)
Anion gap: 8 (ref 5–15)
BUN: 21 mg/dL (ref 8–23)
CO2: 29 mmol/L (ref 22–32)
Calcium: 9.6 mg/dL (ref 8.9–10.3)
Chloride: 102 mmol/L (ref 98–111)
Creatinine, Ser: 0.57 mg/dL (ref 0.44–1.00)
GFR, Estimated: >60 mL/min (ref >60)
Glucose, Bld: 195 mg/dL — ABNORMAL HIGH (ref 70–99)
Potassium: 3.6 mmol/L (ref 3.5–5.1)
Sodium: 139 mmol/L (ref 135–145)
Total Bilirubin: 0.7 mg/dL (ref 0.3–1.2)
Total Protein: 6.8 g/dL (ref 6.5–8.1)

## 2022-01-10 LAB — CBC WITH DIFFERENTIAL/PLATELET
Abs Immature Granulocytes: 0.14 10*3/uL — ABNORMAL HIGH (ref 0.00–0.07)
Basophils Absolute: 0.1 10*3/uL (ref 0.0–0.1)
Basophils Relative: 0 %
Eosinophils Absolute: 0.1 10*3/uL (ref 0.0–0.5)
Eosinophils Relative: 1 %
HCT: 43.2 % (ref 36.0–46.0)
Hemoglobin: 13.6 g/dL (ref 12.0–15.0)
Immature Granulocytes: 1 %
Lymphocytes Relative: 11 %
Lymphs Abs: 1.6 10*3/uL (ref 0.7–4.0)
MCH: 29.6 pg (ref 26.0–34.0)
MCHC: 31.5 g/dL (ref 30.0–36.0)
MCV: 94.1 fL (ref 80.0–100.0)
Monocytes Absolute: 0.9 10*3/uL (ref 0.1–1.0)
Monocytes Relative: 6 %
Neutro Abs: 11 10*3/uL — ABNORMAL HIGH (ref 1.7–7.7)
Neutrophils Relative %: 81 %
Platelets: 268 10*3/uL (ref 150–400)
RBC: 4.59 MIL/uL (ref 3.87–5.11)
RDW: 14.6 % (ref 11.5–15.5)
WBC: 13.7 10*3/uL — ABNORMAL HIGH (ref 4.0–10.5)
nRBC: 0 % (ref 0.0–0.2)

## 2022-01-10 MED ORDER — DIPHENHYDRAMINE HCL 50 MG/ML IJ SOLN
50.0000 mg | Freq: Once | INTRAMUSCULAR | Status: AC
  Administered 2022-01-10: 50 mg via INTRAVENOUS
  Filled 2022-01-10: qty 1

## 2022-01-10 MED ORDER — SODIUM CHLORIDE 0.9 % IV SOLN
10.0000 mg | Freq: Once | INTRAVENOUS | Status: AC
  Administered 2022-01-10: 10 mg via INTRAVENOUS
  Filled 2022-01-10: qty 10

## 2022-01-10 MED ORDER — SODIUM CHLORIDE 0.9 % IV SOLN
65.0000 mg/m2 | Freq: Once | INTRAVENOUS | Status: AC
  Administered 2022-01-10: 90 mg via INTRAVENOUS
  Filled 2022-01-10: qty 15

## 2022-01-10 MED ORDER — HEPARIN SOD (PORK) LOCK FLUSH 100 UNIT/ML IV SOLN
INTRAVENOUS | Status: AC
  Filled 2022-01-10: qty 5

## 2022-01-10 MED ORDER — HEPARIN SOD (PORK) LOCK FLUSH 100 UNIT/ML IV SOLN
500.0000 [IU] | Freq: Once | INTRAVENOUS | Status: AC | PRN
  Administered 2022-01-10: 500 [IU]
  Filled 2022-01-10: qty 5

## 2022-01-10 MED ORDER — SODIUM CHLORIDE 0.9 % IV SOLN
Freq: Once | INTRAVENOUS | Status: AC
  Filled 2022-01-10: qty 250

## 2022-01-10 MED ORDER — FAMOTIDINE IN NACL 20-0.9 MG/50ML-% IV SOLN
20.0000 mg | Freq: Once | INTRAVENOUS | Status: AC
  Administered 2022-01-10: 20 mg via INTRAVENOUS
  Filled 2022-01-10: qty 50

## 2022-01-10 NOTE — Progress Notes (Signed)
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  That incidentally showed right axillary adenopathy and soft tissue in the superior right breast.  This was followed by diagnostic right breast mammogram and ultrasound which showed a 2.3 x 1.6 x 1.2 cm irregular hypoechoic mass in the right breast at the 12 o'clock position 1 cm from the nipple.  Enlarged right axillary lymph node 1.8 cm.  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   Patient has met with Dr. Ferrel Logan who is awaiting clearance from pulmonary before considering surgery.  At baseline patient lives in an assisted living in [place].  Her son lives in [place].  She is on 24 hours home oxygen.  [name] states that she has declined significantly in the last 1 year.  Prior to that she was living independently and was independent of her ADLs.  She was hospitalized about a year ago and was eventually diagnosed with COPD and has been on  oxygen since then.   PET CT scan shows FDG avid spiculated nodule with central cavitation in the left upper lobe.  Size an SUV of this nodule was not quantified on the PET scan.  Partially calcified markedly avid right breast lesion with markedly avid right axillary lymph node metastases.    Interval history-patient is healing well after mastectomy.  Breast drains are out.  She has baseline fatigue and exertional shortness of breath which is overall unchanged.  ECOG PS- 2-3 Pain scale- 0   Review of systems- Review of Systems  Constitutional:  Negative for chills, fever, malaise/fatigue and weight loss.  HENT:  Negative for congestion, ear discharge and nosebleeds.   Eyes:  Negative for blurred vision.  Respiratory:  Positive for shortness of breath. Negative for cough, hemoptysis, sputum production and wheezing.   Cardiovascular:  Negative for chest pain, palpitations, orthopnea and claudication.  Gastrointestinal:  Negative for abdominal pain, blood in stool, constipation, diarrhea, heartburn, melena, nausea and vomiting.  Genitourinary:  Negative for dysuria, flank pain, frequency, hematuria and urgency.  Musculoskeletal:  Negative for back pain, joint pain and myalgias.  Skin:  Negative for rash.  Neurological:  Negative for dizziness, tingling, focal weakness, seizures, weakness and headaches.  Endo/Heme/Allergies:  Does not bruise/bleed easily.  Psychiatric/Behavioral:  Negative for depression and suicidal ideas. The patient does not have insomnia.        Allergies  Allergen Reactions   Penicillins Rash and Other (See Comments)     Past Medical History:  Diagnosis Date   Arthritis    Cancer ([place])    CHF (congestive heart failure) ([place])  COPD (chronic obstructive pulmonary disease) (HCC)    Dementia (HCC)    Hyperlipidemia      Past Surgical History:  Procedure Laterality Date   BREAST BIOPSY Right 10/14/2021   rt 12:00 [place] bx venus clip path pending   BREAST  BIOPSY Right 10/14/2021   rt axilla hydromarker path pending   EYE SURGERY Bilateral 2019   cataract   MASTECTOMY MODIFIED RADICAL Right 12/28/2021   Procedure: MASTECTOMY MODIFIED RADICAL;  Surgeon: Herbert Pun, MD;  Location: [facility] ORS;  Service: General;  Laterality: Right;   PORTACATH PLACEMENT N/A 11/25/2021   Procedure: INSERTION PORT-A-CATH;  Surgeon: Herbert Pun, MD;  Location: [facility] ORS;  Service: General;  Laterality: N/A;    Social History   Socioeconomic History   Marital status: Divorced    Spouse name: Not on file   Number of children: Not on file   Years of education: Not on file   Highest education level: Not on file  Occupational History   Not on file  Tobacco Use   Smoking status: Former    Types: Cigarettes    Quit date: 10/2019    Years since quitting: 2.1    Passive exposure: Past   Smokeless tobacco: Never  Vaping Use   Vaping Use: Never used  Substance and Sexual Activity   Alcohol use: Not Currently   Drug use: Not Currently   Sexual activity: Not Currently  Other Topics Concern   Not on file  Social History Narrative   Not on file   Social Determinants of Health   Financial Resource Strain: Not on file  Food Insecurity: Not on file  Transportation Needs: Not on file  Physical Activity: Not on file  Stress: Not on file  Social Connections: Not on file  Intimate Partner Violence: Not on file    Family History  Problem Relation Age of Onset   Breast cancer Mother    Lung cancer Sister      Current Outpatient Medications:    acetaminophen (TYLENOL) 325 MG tablet, Take 650 mg by mouth See admin instructions. Take 2 tablets (650 mg) by mouth 3 times daily scheduled (0800, 1400 & 2000) & take 2 tablets (650 mg) by mouth up to twice daily as needed for pain., Disp: , Rfl:    albuterol (VENTOLIN HFA) 108 (90 Base) MCG/ACT inhaler, Inhale 2 puffs into the lungs every 6 (six) hours as needed for shortness of breath or wheezing.,  Disp: , Rfl:    Albuterol Sulfate 2.5 MG/0.5ML NEBU, Inhale into the lungs as needed., Disp: , Rfl:    alendronate (FOSAMAX) 70 MG tablet, Take 70 mg by mouth every Monday., Disp: , Rfl:    ammonium lactate (LAC-HYDRIN) 12 % lotion, Apply 1 application. topically daily. (0800) Lower legs., Disp: , Rfl:    atorvastatin (LIPITOR) 20 MG tablet, Take 20 mg by mouth daily. (0800), Disp: , Rfl:    budesonide (PULMICORT) 0.5 MG/2ML nebulizer solution, Inhale 0.5 mg into the lungs daily. (0800), Disp: , Rfl:    calcium carbonate (TUMS EX) 750 MG chewable tablet, Chew 1 tablet by mouth in the morning and at bedtime. (0800 & 2000), Disp: , Rfl:    Cholecalciferol (VITAMIN D3) 50 MCG (2000 UT) TABS, Take 2,000 Units by mouth daily. (0800), Disp: , Rfl:    guaiFENesin (MUCINEX) 600 MG 12 hr tablet, Take 600 mg by mouth 2 (two) times daily as needed (congestion.)., Disp: , Rfl:    INCRUSE ELLIPTA 62.5 MCG/ACT AEPB, Inhale  1 puff into the lungs daily. (0800), Disp: , Rfl:    lidocaine-prilocaine (EMLA) cream, Apply to affected area once, Disp: 30 g, Rfl: 3   LUMIGAN 0.01 % SOLN, Place 1 drop into both eyes at bedtime. (2000), Disp: , Rfl:    metoprolol succinate (TOPROL-XL) 25 MG 24 hr tablet, Take 25 mg by mouth daily. (0800) Hold if SBP<100 or Pulse <60., Disp: , Rfl:    Multiple Vitamins-Minerals (HEALTHY EYES SUPERVISION 2 PO), Take 1 capsule by mouth in the morning and at bedtime. (0730 & 1730), Disp: , Rfl:    OXYGEN, Inhale 2 L/min into the lungs as needed (shortness of breath OR if pulse ox is <90% on room air)., Disp: , Rfl:    potassium chloride SA (KLOR-CON M) 20 MEQ tablet, Take 20 mEq by mouth daily. (0800), Disp: , Rfl:    ondansetron (ZOFRAN) 8 MG tablet, Take 1 tablet (8 mg total) by mouth 2 (two) times daily as needed (Nausea or vomiting). (Patient not taking: Reported on 01/10/2022), Disp: 30 tablet, Rfl: 1   prochlorperazine (COMPAZINE) 10 MG tablet, Take 1 tablet (10 mg total) by mouth every 6  (six) hours as needed (Nausea or vomiting). (Patient not taking: Reported on 01/10/2022), Disp: 30 tablet, Rfl: 1   traMADol (ULTRAM) 50 MG tablet, Take 1 tablet (50 mg total) by mouth every 6 (six) hours as needed. (Patient not taking: Reported on 01/10/2022), Disp: 20 tablet, Rfl: 0 No current facility-administered medications for this visit.  Facility-Administered Medications Ordered in Other Visits:    heparin lock flush 100 UNIT/ML injection, , , ,   Physical exam:  Vitals:   01/10/22 0918  BP: 112/69  Pulse: 96  Resp: 16  Temp: 97.9 F (36.6 C)  TempSrc: Tympanic  Weight: 98 lb 4.8 oz (44.6 kg)  Height: _0  (1.575 m)   Physical Exam Constitutional:      General: She is not in acute distress.    Comments: Elderly frail lady on home O2  Cardiovascular:     Rate and Rhythm: Normal rate and regular rhythm.     Heart sounds: Normal heart sounds.  Pulmonary:     Effort: Pulmonary effort is normal.     Breath sounds: Normal breath sounds.  Abdominal:     General: Bowel sounds are normal.     Palpations: Abdomen is soft.  Skin:    General: Skin is warm and dry.  Neurological:     Mental Status: She is alert and oriented to person, place, and time.         Latest Ref Rng & Units 01/10/2022    8:57 AM  CMP  Glucose 70 - 99 mg/dL 195   BUN 8 - 23 mg/dL 21   Creatinine 0.44 - 1.00 mg/dL 0.57   Sodium 135 - 145 mmol/L 139   Potassium 3.5 - 5.1 mmol/L 3.6   Chloride 98 - 111 mmol/L 102   CO2 22 - 32 mmol/L 29   Calcium 8.9 - 10.3 mg/dL 9.6   Total Protein 6.5 - 8.1 g/dL 6.8   Total Bilirubin 0.3 - 1.2 mg/dL 0.7   Alkaline Phos 38 - 126 U/L 77   AST 15 - 41 U/L 22   ALT 0 - 44 U/L 12       Latest Ref Rng & Units 01/10/2022    8:57 AM  CBC  WBC 4.0 - 10.5 K/uL 13.7   Hemoglobin 12.0 - 15.0 g/dL 13.6   Hematocrit  36.0 - 46.0 % 43.2   Platelets 150 - 400 K/uL 268     No images are attached to the encounter.  [place] OR NERVE BLOCK-IMAGE ONLY [facility])  Result Date:  12/28/2021 There is no interpretation for this exam.  This order is for images obtained during a surgical procedure.  Please See "Surgeries" Tab for more information regarding the procedure.     Assessment and plan- Patient is a 76 y.o. female with clinical prognostic stage IIIb invasive apocrine  carcinoma of the right breast apocrine variety T2 N1 M0 triple negative.  Is here to discuss final pathology results and further management  Discussed final pathology results which showed a 18 mm triple negative tumorApocrine subtype with negative margins.  She had multiple matted lymph nodes and at least 4 of them were positive for macrometastases with extranodal extension out of the 12 lymph nodes that were examined.  PT1c pN2a.  Discussed these results with patient and her son in detail.  Typically apocrine subtype triple negative breast cancer has a better prognosis than conventional triple negative breast cancer.  However given that she has node positive disease I would recommend adjuvant chemotherapy for her.  She tolerated cycle 1 of Taxol well.  Today will be her cycle 2 of Taxol.  Starting next week I plan to give her weekly CarboTaxol with a carboplatin AUC dose of 1.5.  Given her age I do not plan to give anthracycline-based treatment.  Plan is to do 12 weekly cycles of CarboTaxol chemotherapy followed by consideration for adjuvant radiation therapy.  I will also consider repeating a CT of her chest to follow-up on her lung nodule in about 3 months time.  Patient will directly proceed for cycle 3 of CarboTaxol chemotherapy next week and I will see her back in 2 weeks for cycle 4   Visit Diagnosis 1. Encounter for antineoplastic chemotherapy   2. Goals of care, counseling/discussion   3. Malignant neoplasm of upper-outer quadrant of right breast in female, estrogen receptor negative ([place])      Dr. Randa Evens, MD, MPH [facility] [phone] 01/10/2022 4:18  PM

## 2022-01-10 NOTE — Progress Notes (Signed)
Pt still sore from surgery but everything is going well

## 2022-01-10 NOTE — Patient Instructions (Signed)
[facility] AT [place]  Discharge Instructions: Thank you for choosing [facility] to provide your oncology and hematology care.  If you have a lab appointment with the [facility], please go directly to the [facility] and check in at the registration area.  Wear comfortable clothing and clothing appropriate for easy access to any Portacath or PICC line.   We strive to give you quality time with your provider. You may need to reschedule your appointment if you arrive late (15 or more minutes).  Arriving late affects you and other patients whose appointments are after yours.  Also, if you miss three or more appointments without notifying the office, you may be dismissed from the clinic at the provider's discretion.      For prescription refill requests, have your pharmacy contact our office and allow 72 hours for refills to be completed.    Today you received the following chemotherapy and/or immunotherapy agents Paclitaxel.      To help prevent nausea and vomiting after your treatment, we encourage you to take your nausea medication as directed.  BELOW ARE SYMPTOMS THAT SHOULD BE REPORTED IMMEDIATELY: *FEVER GREATER THAN 100.4 F (38 C) OR HIGHER *CHILLS OR SWEATING *NAUSEA AND VOMITING THAT IS NOT CONTROLLED WITH YOUR NAUSEA MEDICATION *UNUSUAL SHORTNESS OF BREATH *UNUSUAL BRUISING OR BLEEDING *URINARY PROBLEMS (pain or burning when urinating, or frequent urination) *BOWEL PROBLEMS (unusual diarrhea, constipation, pain near the anus) TENDERNESS IN MOUTH AND THROAT WITH OR WITHOUT PRESENCE OF ULCERS (sore throat, sores in mouth, or a toothache) UNUSUAL RASH, SWELLING OR PAIN  UNUSUAL VAGINAL DISCHARGE OR ITCHING   Items with * indicate a potential emergency and should be followed up as soon as possible or go to the Emergency Department if any problems should occur.  Please show the CHEMOTHERAPY ALERT CARD or IMMUNOTHERAPY ALERT CARD at check-in to  the Emergency Department and triage nurse.  Should you have questions after your visit or need to cancel or reschedule your appointment, please contact [facility] [place] AT [place]  [phone] and follow the prompts.  Office hours are 8:00 a.m. to 4:30 p.m. Monday - Friday. Please note that voicemails left after 4:00 p.m. may not be returned until the following business day.  We are closed weekends and major holidays. You have access to a nurse at all times for urgent questions. Please call the main number to the clinic [phone] and follow the prompts.  For any non-urgent questions, you may also contact your provider using MyChart. We now offer e-Visits for anyone 74 and older to request care online for non-urgent symptoms. For details visit [email or web address].   Also download the MyChart app! Go to the app store, search "MyChart", open the app, select [facility], and log in with your MyChart username and password.  Due to Covid, a mask is required upon entering the hospital/clinic. If you do not have a mask, one will be given to you upon arrival. For doctor visits, patients may have 1 support person aged 66 or older with them. For treatment visits, patients cannot have anyone with them due to current Covid guidelines and our immunocompromised population.

## 2022-01-14 MED FILL — Dexamethasone Sodium Phosphate Inj 100 MG/10ML: INTRAMUSCULAR | Qty: 1 | Status: AC

## 2022-01-17 ENCOUNTER — Inpatient Hospital Stay: Payer: Medicare HMO

## 2022-01-17 VITALS — BP 100/49 | HR 81 | Temp 98.3°F | Resp 18 | Ht 62.0 in | Wt 98.2 lb

## 2022-01-17 DIAGNOSIS — Z171 Estrogen receptor negative status [ER-]: Secondary | ICD-10-CM

## 2022-01-17 DIAGNOSIS — Z5111 Encounter for antineoplastic chemotherapy: Secondary | ICD-10-CM | POA: Diagnosis not present

## 2022-01-17 LAB — CBC WITH DIFFERENTIAL/PLATELET
Abs Immature Granulocytes: 0.1 10*3/uL — ABNORMAL HIGH (ref 0.00–0.07)
Basophils Absolute: 0.1 10*3/uL (ref 0.0–0.1)
Basophils Relative: 1 %
Eosinophils Absolute: 0.2 10*3/uL (ref 0.0–0.5)
Eosinophils Relative: 2 %
HCT: 42.9 % (ref 36.0–46.0)
Hemoglobin: 13.9 g/dL (ref 12.0–15.0)
Immature Granulocytes: 1 %
Lymphocytes Relative: 15 %
Lymphs Abs: 1.5 10*3/uL (ref 0.7–4.0)
MCH: 30.2 pg (ref 26.0–34.0)
MCHC: 32.4 g/dL (ref 30.0–36.0)
MCV: 93.1 fL (ref 80.0–100.0)
Monocytes Absolute: 0.9 10*3/uL (ref 0.1–1.0)
Monocytes Relative: 9 %
Neutro Abs: 7.4 10*3/uL (ref 1.7–7.7)
Neutrophils Relative %: 72 %
Platelets: 256 10*3/uL (ref 150–400)
RBC: 4.61 MIL/uL (ref 3.87–5.11)
RDW: 14.5 % (ref 11.5–15.5)
WBC: 10.2 10*3/uL (ref 4.0–10.5)
nRBC: 0 % (ref 0.0–0.2)

## 2022-01-17 LAB — COMPREHENSIVE METABOLIC PANEL
ALT: 12 U/L (ref 0–44)
AST: 19 U/L (ref 15–41)
Albumin: 3.6 g/dL (ref 3.5–5.0)
Alkaline Phosphatase: 71 U/L (ref 38–126)
Anion gap: 6 (ref 5–15)
BUN: 20 mg/dL (ref 8–23)
CO2: 29 mmol/L (ref 22–32)
Calcium: 9.2 mg/dL (ref 8.9–10.3)
Chloride: 103 mmol/L (ref 98–111)
Creatinine, Ser: 0.43 mg/dL — ABNORMAL LOW (ref 0.44–1.00)
GFR, Estimated: >60 mL/min (ref >60)
Glucose, Bld: 93 mg/dL (ref 70–99)
Potassium: 4 mmol/L (ref 3.5–5.1)
Sodium: 138 mmol/L (ref 135–145)
Total Bilirubin: 0.6 mg/dL (ref 0.3–1.2)
Total Protein: 6.8 g/dL (ref 6.5–8.1)

## 2022-01-17 MED ORDER — SODIUM CHLORIDE 0.9 % IV SOLN
10.0000 mg | Freq: Once | INTRAVENOUS | Status: AC
  Administered 2022-01-17: 10 mg via INTRAVENOUS
  Filled 2022-01-17: qty 10

## 2022-01-17 MED ORDER — DIPHENHYDRAMINE HCL 50 MG/ML IJ SOLN
50.0000 mg | Freq: Once | INTRAMUSCULAR | Status: AC
  Administered 2022-01-17: 50 mg via INTRAVENOUS
  Filled 2022-01-17: qty 1

## 2022-01-17 MED ORDER — FAMOTIDINE IN NACL 20-0.9 MG/50ML-% IV SOLN
20.0000 mg | Freq: Once | INTRAVENOUS | Status: AC
  Administered 2022-01-17: 20 mg via INTRAVENOUS
  Filled 2022-01-17: qty 50

## 2022-01-17 MED ORDER — HEPARIN SOD (PORK) LOCK FLUSH 100 UNIT/ML IV SOLN
500.0000 [IU] | Freq: Once | INTRAVENOUS | Status: AC | PRN
  Administered 2022-01-17: 500 [IU]
  Filled 2022-01-17: qty 5

## 2022-01-17 MED ORDER — SODIUM CHLORIDE 0.9 % IV SOLN
Freq: Once | INTRAVENOUS | Status: AC
  Filled 2022-01-17: qty 250

## 2022-01-17 MED ORDER — SODIUM CHLORIDE 0.9 % IV SOLN
65.0000 mg/m2 | Freq: Once | INTRAVENOUS | Status: AC
  Administered 2022-01-17: 90 mg via INTRAVENOUS
  Filled 2022-01-17: qty 15

## 2022-01-17 MED ORDER — SODIUM CHLORIDE 0.9 % IV SOLN
85.3500 mg | Freq: Once | INTRAVENOUS | Status: AC
  Administered 2022-01-17: 90 mg via INTRAVENOUS
  Filled 2022-01-17: qty 9

## 2022-01-17 NOTE — Patient Instructions (Signed)
[facility]  Discharge Instructions: Thank you for choosing [facility] to provide your oncology and hematology care.  If you have a lab appointment with the Cancer Center, please go directly to the Cancer Center and check in at the registration area.  Wear comfortable clothing and clothing appropriate for easy access to any Portacath or PICC line.   We strive to give you quality time with your provider. You may need to reschedule your appointment if you arrive late (15 or more minutes).  Arriving late affects you and other patients whose appointments are after yours.  Also, if you miss three or more appointments without notifying the office, you may be dismissed from the clinic at the provider's discretion.      For prescription refill requests, have your pharmacy contact our office and allow 72 hours for refills to be completed.    Today you received the following chemotherapy and/or immunotherapy agents CARBOPLATIN and TAXOL      To help prevent nausea and vomiting after your treatment, we encourage you to take your nausea medication as directed.  BELOW ARE SYMPTOMS THAT SHOULD BE REPORTED IMMEDIATELY: *FEVER GREATER THAN 100.4 F (38 C) OR HIGHER *CHILLS OR SWEATING *NAUSEA AND VOMITING THAT IS NOT CONTROLLED WITH YOUR NAUSEA MEDICATION *UNUSUAL SHORTNESS OF BREATH *UNUSUAL BRUISING OR BLEEDING *URINARY PROBLEMS (pain or burning when urinating, or frequent urination) *BOWEL PROBLEMS (unusual diarrhea, constipation, pain near the anus) TENDERNESS IN MOUTH AND THROAT WITH OR WITHOUT PRESENCE OF ULCERS (sore throat, sores in mouth, or a toothache) UNUSUAL RASH, SWELLING OR PAIN  UNUSUAL VAGINAL DISCHARGE OR ITCHING   Items with * indicate a potential emergency and should be followed up as soon as possible or go to the Emergency Department if any problems should occur.  Please show the CHEMOTHERAPY ALERT CARD or IMMUNOTHERAPY ALERT CARD at  check-in to the Emergency Department and triage nurse.  Should you have questions after your visit or need to cancel or reschedule your appointment, please contact [facility]  [phone] and follow the prompts.  Office hours are 8:00 a.m. to 4:30 p.m. Monday - Friday. Please note that voicemails left after 4:00 p.m. may not be returned until the following business day.  We are closed weekends and major holidays. You have access to a nurse at all times for urgent questions. Please call the main number to the clinic [phone] and follow the prompts.  For any non-urgent questions, you may also contact your provider using MyChart. We now offer e-Visits for anyone 18 and older to request care online for non-urgent symptoms. For details visit [email or web address].   Also download the MyChart app! Go to the app store, search "MyChart", open the app, select [place], and log in with your MyChart username and password.  Masks are optional in the cancer centers. If you would like for your care team to wear a mask while they are taking care of you, please let them know. For doctor visits, patients may have with them one support person who is at least 76 years old. At this time, visitors are not allowed in the infusion area.  Carboplatin injection What is this medication? CARBOPLATIN (KAR boe pla tin) is a chemotherapy drug. It targets fast dividing cells, like cancer cells, and causes these cells to die. This medicine is used to treat ovarian cancer and many other cancers. This medicine may be used for other purposes; ask your health care provider   or pharmacist if you have questions. COMMON BRAND NAME(S): Paraplatin What should I tell my care team before I take this medication? They need to know if you have any of these conditions: blood disorders hearing problems kidney disease recent or ongoing radiation therapy an unusual or allergic reaction to carboplatin,  cisplatin, other chemotherapy, other medicines, foods, dyes, or preservatives pregnant or trying to get pregnant breast-feeding How should I use this medication? This drug is usually given as an infusion into a vein. It is administered in a hospital or clinic by a specially trained health care professional. Talk to your pediatrician regarding the use of this medicine in children. Special care may be needed. Overdosage: If you think you have taken too much of this medicine contact a poison control center or emergency room at once. NOTE: This medicine is only for you. Do not share this medicine with others. What if I miss a dose? It is important not to miss a dose. Call your doctor or health care professional if you are unable to keep an appointment. What may interact with this medication? medicines for seizures medicines to increase blood counts like filgrastim, pegfilgrastim, sargramostim some antibiotics like amikacin, gentamicin, neomycin, streptomycin, tobramycin vaccines Talk to your doctor or health care professional before taking any of these medicines: acetaminophen aspirin ibuprofen ketoprofen naproxen This list may not describe all possible interactions. Give your health care provider a list of all the medicines, herbs, non-prescription drugs, or dietary supplements you use. Also tell them if you smoke, drink alcohol, or use illegal drugs. Some items may interact with your medicine. What should I watch for while using this medication? Your condition will be monitored carefully while you are receiving this medicine. You will need important blood work done while you are taking this medicine. This drug may make you feel generally unwell. This is not uncommon, as chemotherapy can affect healthy cells as well as cancer cells. Report any side effects. Continue your course of treatment even though you feel ill unless your doctor tells you to stop. In some cases, you may be given additional  medicines to help with side effects. Follow all directions for their use. Call your doctor or health care professional for advice if you get a fever, chills or sore throat, or other symptoms of a cold or flu. Do not treat yourself. This drug decreases your body's ability to fight infections. Try to avoid being around people who are sick. This medicine may increase your risk to bruise or bleed. Call your doctor or health care professional if you notice any unusual bleeding. Be careful brushing and flossing your teeth or using a toothpick because you may get an infection or bleed more easily. If you have any dental work done, tell your dentist you are receiving this medicine. Avoid taking products that contain aspirin, acetaminophen, ibuprofen, naproxen, or ketoprofen unless instructed by your doctor. These medicines may hide a fever. Do not become pregnant while taking this medicine. Women should inform their doctor if they wish to become pregnant or think they might be pregnant. There is a potential for serious side effects to an unborn child. Talk to your health care professional or pharmacist for more information. Do not breast-feed an infant while taking this medicine. What side effects may I notice from receiving this medication? Side effects that you should report to your doctor or health care professional as soon as possible: allergic reactions like skin rash, itching or hives, swelling of the face, lips, or   tongue signs of infection - fever or chills, cough, sore throat, pain or difficulty passing urine signs of decreased platelets or bleeding - bruising, pinpoint red spots on the skin, black, tarry stools, nosebleeds signs of decreased red blood cells - unusually weak or tired, fainting spells, lightheadedness breathing problems changes in hearing changes in vision chest pain high blood pressure low blood counts - This drug may decrease the number of white blood cells, red blood cells and  platelets. You may be at increased risk for infections and bleeding. nausea and vomiting pain, swelling, redness or irritation at the injection site pain, tingling, numbness in the hands or feet problems with balance, talking, walking trouble passing urine or change in the amount of urine Side effects that usually do not require medical attention (report to your doctor or health care professional if they continue or are bothersome): hair loss loss of appetite metallic taste in the mouth or changes in taste This list may not describe all possible side effects. Call your doctor for medical advice about side effects. You may report side effects to FDA at 1-800-FDA-1088. Where should I keep my medication? This drug is given in a hospital or clinic and will not be stored at home. NOTE: This sheet is a summary. It may not cover all possible information. If you have questions about this medicine, talk to your doctor, pharmacist, or health care provider.  2023 Elsevier/Gold Standard (2007-12-26 00:00:00)  Paclitaxel injection What is this medication? PACLITAXEL (PAK li TAX el) is a chemotherapy drug. It targets fast dividing cells, like cancer cells, and causes these cells to die. This medicine is used to treat ovarian cancer, breast cancer, lung cancer, Kaposi's sarcoma, and other cancers. This medicine may be used for other purposes; ask your health care provider or pharmacist if you have questions. COMMON BRAND NAME(S): Onxol, Taxol What should I tell my care team before I take this medication? They need to know if you have any of these conditions: history of irregular heartbeat liver disease low blood counts, like low white cell, platelet, or red cell counts lung or breathing disease, like asthma tingling of the fingers or toes, or other nerve disorder an unusual or allergic reaction to paclitaxel, alcohol, polyoxyethylated castor oil, other chemotherapy, other medicines, foods, dyes, or  preservatives pregnant or trying to get pregnant breast-feeding How should I use this medication? This drug is given as an infusion into a vein. It is administered in a hospital or clinic by a specially trained health care professional. Talk to your pediatrician regarding the use of this medicine in children. Special care may be needed. Overdosage: If you think you have taken too much of this medicine contact a poison control center or emergency room at once. NOTE: This medicine is only for you. Do not share this medicine with others. What if I miss a dose? It is important not to miss your dose. Call your doctor or health care professional if you are unable to keep an appointment. What may interact with this medication? Do not take this medicine with any of the following medications: live virus vaccines This medicine may also interact with the following medications: antiviral medicines for hepatitis, HIV or AIDS certain antibiotics like erythromycin and clarithromycin certain medicines for fungal infections like ketoconazole and itraconazole certain medicines for seizures like carbamazepine, phenobarbital, phenytoin gemfibrozil nefazodone rifampin St. John's wort This list may not describe all possible interactions. Give your health care provider a list of all the medicines, herbs,   non-prescription drugs, or dietary supplements you use. Also tell them if you smoke, drink alcohol, or use illegal drugs. Some items may interact with your medicine. What should I watch for while using this medication? Your condition will be monitored carefully while you are receiving this medicine. You will need important blood work done while you are taking this medicine. This medicine can cause serious allergic reactions. To reduce your risk you will need to take other medicine(s) before treatment with this medicine. If you experience allergic reactions like skin rash, itching or hives, swelling of the face,  lips, or tongue, tell your doctor or health care professional right away. In some cases, you may be given additional medicines to help with side effects. Follow all directions for their use. This drug may make you feel generally unwell. This is not uncommon, as chemotherapy can affect healthy cells as well as cancer cells. Report any side effects. Continue your course of treatment even though you feel ill unless your doctor tells you to stop. Call your doctor or health care professional for advice if you get a fever, chills or sore throat, or other symptoms of a cold or flu. Do not treat yourself. This drug decreases your body's ability to fight infections. Try to avoid being around people who are sick. This medicine may increase your risk to bruise or bleed. Call your doctor or health care professional if you notice any unusual bleeding. Be careful brushing and flossing your teeth or using a toothpick because you may get an infection or bleed more easily. If you have any dental work done, tell your dentist you are receiving this medicine. Avoid taking products that contain aspirin, acetaminophen, ibuprofen, naproxen, or ketoprofen unless instructed by your doctor. These medicines may hide a fever. Do not become pregnant while taking this medicine. Women should inform their doctor if they wish to become pregnant or think they might be pregnant. There is a potential for serious side effects to an unborn child. Talk to your health care professional or pharmacist for more information. Do not breast-feed an infant while taking this medicine. Men are advised not to father a child while receiving this medicine. This product may contain alcohol. Ask your pharmacist or healthcare provider if this medicine contains alcohol. Be sure to tell all healthcare providers you are taking this medicine. Certain medicines, like metronidazole and disulfiram, can cause an unpleasant reaction when taken with alcohol. The reaction  includes flushing, headache, nausea, vomiting, sweating, and increased thirst. The reaction can last from 30 minutes to several hours. What side effects may I notice from receiving this medication? Side effects that you should report to your doctor or health care professional as soon as possible: allergic reactions like skin rash, itching or hives, swelling of the face, lips, or tongue breathing problems changes in vision fast, irregular heartbeat high or low blood pressure mouth sores pain, tingling, numbness in the hands or feet signs of decreased platelets or bleeding - bruising, pinpoint red spots on the skin, black, tarry stools, blood in the urine signs of decreased red blood cells - unusually weak or tired, feeling faint or lightheaded, falls signs of infection - fever or chills, cough, sore throat, pain or difficulty passing urine signs and symptoms of liver injury like dark yellow or brown urine; general ill feeling or flu-like symptoms; light-colored stools; loss of appetite; nausea; right upper belly pain; unusually weak or tired; yellowing of the eyes or skin swelling of the ankles, feet, hands unusually   slow heartbeat Side effects that usually do not require medical attention (report to your doctor or health care professional if they continue or are bothersome): diarrhea hair loss loss of appetite muscle or joint pain nausea, vomiting pain, redness, or irritation at site where injected tiredness This list may not describe all possible side effects. Call your doctor for medical advice about side effects. You may report side effects to FDA at 1-800-FDA-1088. Where should I keep my medication? This drug is given in a hospital or clinic and will not be stored at home. NOTE: This sheet is a summary. It may not cover all possible information. If you have questions about this medicine, talk to your doctor, pharmacist, or health care provider.  2023 Elsevier/Gold Standard  (2021-06-18 00:00:00)   

## 2022-01-21 MED FILL — Dexamethasone Sodium Phosphate Inj 100 MG/10ML: INTRAMUSCULAR | Qty: 1 | Status: AC

## 2022-01-24 ENCOUNTER — Inpatient Hospital Stay (HOSPITAL_BASED_OUTPATIENT_CLINIC_OR_DEPARTMENT_OTHER): Payer: Medicare HMO | Admitting: Oncology

## 2022-01-24 ENCOUNTER — Inpatient Hospital Stay: Payer: Medicare HMO

## 2022-01-24 ENCOUNTER — Encounter: Payer: Self-pay | Admitting: Oncology

## 2022-01-24 VITALS — BP 106/66 | HR 80 | Resp 18 | Wt 93.4 lb

## 2022-01-24 DIAGNOSIS — C50411 Malignant neoplasm of upper-outer quadrant of right female breast: Secondary | ICD-10-CM | POA: Diagnosis not present

## 2022-01-24 DIAGNOSIS — Z5111 Encounter for antineoplastic chemotherapy: Secondary | ICD-10-CM | POA: Diagnosis not present

## 2022-01-24 DIAGNOSIS — Z171 Estrogen receptor negative status [ER-]: Secondary | ICD-10-CM

## 2022-01-24 LAB — CBC WITH DIFFERENTIAL/PLATELET
Abs Immature Granulocytes: 0.11 10*3/uL — ABNORMAL HIGH (ref 0.00–0.07)
Basophils Absolute: 0.1 10*3/uL (ref 0.0–0.1)
Basophils Relative: 1 %
Eosinophils Absolute: 0.2 10*3/uL (ref 0.0–0.5)
Eosinophils Relative: 2 %
HCT: 42.6 % (ref 36.0–46.0)
Hemoglobin: 13.7 g/dL (ref 12.0–15.0)
Immature Granulocytes: 1 %
Lymphocytes Relative: 18 %
Lymphs Abs: 1.6 10*3/uL (ref 0.7–4.0)
MCH: 29.9 pg (ref 26.0–34.0)
MCHC: 32.2 g/dL (ref 30.0–36.0)
MCV: 93 fL (ref 80.0–100.0)
Monocytes Absolute: 0.7 10*3/uL (ref 0.1–1.0)
Monocytes Relative: 8 %
Neutro Abs: 6.2 10*3/uL (ref 1.7–7.7)
Neutrophils Relative %: 70 %
Platelets: 261 10*3/uL (ref 150–400)
RBC: 4.58 MIL/uL (ref 3.87–5.11)
RDW: 14.8 % (ref 11.5–15.5)
WBC: 8.7 10*3/uL (ref 4.0–10.5)
nRBC: 0 % (ref 0.0–0.2)

## 2022-01-24 LAB — COMPREHENSIVE METABOLIC PANEL
ALT: 14 U/L (ref 0–44)
AST: 19 U/L (ref 15–41)
Albumin: 3.7 g/dL (ref 3.5–5.0)
Alkaline Phosphatase: 75 U/L (ref 38–126)
Anion gap: 7 (ref 5–15)
BUN: 17 mg/dL (ref 8–23)
CO2: 28 mmol/L (ref 22–32)
Calcium: 9.3 mg/dL (ref 8.9–10.3)
Chloride: 101 mmol/L (ref 98–111)
Creatinine, Ser: 0.43 mg/dL — ABNORMAL LOW (ref 0.44–1.00)
GFR, Estimated: >60 mL/min (ref >60)
Glucose, Bld: 98 mg/dL (ref 70–99)
Potassium: 3.9 mmol/L (ref 3.5–5.1)
Sodium: 136 mmol/L (ref 135–145)
Total Bilirubin: 0.7 mg/dL (ref 0.3–1.2)
Total Protein: 7 g/dL (ref 6.5–8.1)

## 2022-01-24 MED ORDER — SODIUM CHLORIDE 0.9 % IV SOLN
10.0000 mg | Freq: Once | INTRAVENOUS | Status: AC
  Administered 2022-01-24: 10 mg via INTRAVENOUS
  Filled 2022-01-24: qty 10

## 2022-01-24 MED ORDER — SODIUM CHLORIDE 0.9% FLUSH
10.0000 mL | Freq: Once | INTRAVENOUS | Status: AC
  Administered 2022-01-24: 10 mL via INTRAVENOUS
  Filled 2022-01-24: qty 10

## 2022-01-24 MED ORDER — DIPHENHYDRAMINE HCL 50 MG/ML IJ SOLN
50.0000 mg | Freq: Once | INTRAMUSCULAR | Status: AC
  Administered 2022-01-24: 50 mg via INTRAVENOUS
  Filled 2022-01-24: qty 1

## 2022-01-24 MED ORDER — SODIUM CHLORIDE 0.9 % IV SOLN
85.3500 mg | Freq: Once | INTRAVENOUS | Status: AC
  Administered 2022-01-24: 90 mg via INTRAVENOUS
  Filled 2022-01-24: qty 9

## 2022-01-24 MED ORDER — SODIUM CHLORIDE 0.9 % IV SOLN
65.0000 mg/m2 | Freq: Once | INTRAVENOUS | Status: AC
  Administered 2022-01-24: 90 mg via INTRAVENOUS
  Filled 2022-01-24: qty 15

## 2022-01-24 MED ORDER — FAMOTIDINE IN NACL 20-0.9 MG/50ML-% IV SOLN
20.0000 mg | Freq: Once | INTRAVENOUS | Status: AC
  Administered 2022-01-24: 20 mg via INTRAVENOUS
  Filled 2022-01-24: qty 50

## 2022-01-24 MED ORDER — HEPARIN SOD (PORK) LOCK FLUSH 100 UNIT/ML IV SOLN
500.0000 [IU] | Freq: Once | INTRAVENOUS | Status: AC | PRN
  Administered 2022-01-24: 500 [IU]
  Filled 2022-01-24: qty 5

## 2022-01-24 MED ORDER — SODIUM CHLORIDE 0.9 % IV SOLN
Freq: Once | INTRAVENOUS | Status: AC
  Filled 2022-01-24: qty 250

## 2022-01-24 NOTE — Progress Notes (Addendum)
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  Today will be her cycle 2 of carboplatin  Heme/Onc history:  Patient is a 76 year old female who underwent CT chest without contrast to evaluate symptoms of shortness of breath.  That incidentally showed right axillary adenopathy and soft tissue in the superior right breast.  This was followed by diagnostic right breast mammogram and ultrasound which showed a 2.3 x 1.6 x 1.2 cm irregular hypoechoic mass in the right breast at the 12 o'clock position 1 cm from the nipple.  Enlarged right axillary lymph node 1.8 cm.  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  Patient has oxygen dependent COPD.    PET CT scan shows FDG avid spiculated nodule with central cavitation in the left upper lobe.  Size an SUV of this nodule was not quantified on the PET scan.  Partially calcified markedly avid right breast lesion with markedly avid right axillary lymph node metastases.  Patient underwent 1 cycle of Taxol chemotherapy while awaiting surgery and underwent final surgery on 12/28/2021.  Final pathology showed18 mm grade 3 invasive mammary carcinoma triple negative. Macrometastases and at least for an micrometastases in 0 lymph nodes.  Largest size of metastatic deposit 30 mm.  Extranodal extension present.  pT1C  pN2A    Interval history-tolerating chemotherapy well so far.  Denies any worsening neuropathy.  Denies any hospitalizations.  Denies any significant nausea or vomiting  ECOG PS- 2 Pain scale- 0   Review of systems- Review of Systems  Constitutional:  Positive for malaise/fatigue. Negative for chills, fever and weight loss.  HENT:  Negative for congestion, ear discharge and nosebleeds.   Eyes:  Negative for blurred vision.  Respiratory:  Positive for shortness of breath. Negative for cough, hemoptysis, sputum production and wheezing.   Cardiovascular:  Negative for chest pain, palpitations, orthopnea and claudication.  Gastrointestinal:  Negative for abdominal pain, blood in stool, constipation, diarrhea, heartburn, melena, nausea and vomiting.  Genitourinary:  Negative for dysuria, flank pain, frequency, hematuria and urgency.  Musculoskeletal:  Negative for back pain, joint pain and myalgias.  Skin:  Negative for rash.  Neurological:  Negative for dizziness, tingling, focal weakness, seizures, weakness and headaches.  Endo/Heme/Allergies:  Does not bruise/bleed easily.  Psychiatric/Behavioral:  Negative for depression and suicidal ideas. The patient does not have insomnia.       Allergies  Allergen Reactions   Penicillins Rash and Other (See Comments)     Past Medical History:  Diagnosis Date   Arthritis    Cancer (HCC)    CHF (congestive heart failure) (HCC)    COPD (chronic obstructive pulmonary disease) (HCC)    Dementia (HCC)    Hyperlipidemia      Past Surgical History:  Procedure Laterality Date   BREAST BIOPSY Right 10/14/2021   rt 12:00 [place] bx venus clip path pending   BREAST BIOPSY Right 10/14/2021   rt axilla  hydromarker path pending   EYE SURGERY Bilateral 2019   cataract   MASTECTOMY MODIFIED RADICAL Right 12/28/2021   Procedure: MASTECTOMY MODIFIED RADICAL;  Surgeon: Carolan Shiver, MD;  Location: [facility] OR[facility];  Service: General;  Laterality: Right;    PORTACATH PLACEMENT N/A 11/25/2021   Procedure: INSERTION PORT-A-CATH;  Surgeon: Carolan Shiver, MD;  Location: [facility];  Service: General;  Laterality: N/A;    Social History   Socioeconomic History   Marital status: Divorced    Spouse name: Not on file   Number of children: Not on file   Years of education: Not on file   Highest education level: Not on file  Occupational History   Not on file  Tobacco Use   Smoking status: Former    Types: Cigarettes    Quit date: 10/2019    Years since quitting: 2.2    Passive exposure: Past   Smokeless tobacco: Never  Vaping Use   Vaping Use: Never used  Substance and Sexual Activity   Alcohol use: Not Currently   Drug use: Not Currently   Sexual activity: Not Currently  Other Topics Concern   Not on file  Social History Narrative   Not on file   Social Determinants of Health   Financial Resource Strain: Not on file  Food Insecurity: Not on file  Transportation Needs: Not on file  Physical Activity: Not on file  Stress: Not on file  Social Connections: Not on file  Intimate Partner Violence: Not on file    Family History  Problem Relation Age of Onset   Breast cancer Mother    Lung cancer Sister      Current Outpatient Medications:    acetaminophen (TYLENOL) 325 MG tablet, Take 650 mg by mouth See admin instructions. Take 2 tablets (650 mg) by mouth 3 times daily scheduled (0800, 1400 & 2000) & take 2 tablets (650 mg) by mouth up to twice daily as needed for pain., Disp: , Rfl:    albuterol (VENTOLIN HFA) 108 (90 Base) MCG/ACT inhaler, Inhale 2 puffs into the lungs every 6 (six) hours as needed for shortness of breath or wheezing., Disp: , Rfl:    alendronate (FOSAMAX) 70 MG tablet, Take 70 mg by mouth every Monday., Disp: , Rfl:    ammonium lactate (LAC-HYDRIN) 12 % lotion, Apply 1 application. topically daily. (0800) Lower legs., Disp: , Rfl:    atorvastatin (LIPITOR) 20 MG tablet, Take 20 mg by mouth daily.  (0800), Disp: , Rfl:    budesonide (PULMICORT) 0.5 MG/2ML nebulizer solution, Inhale 0.5 mg into the lungs daily. (0800), Disp: , Rfl:    calcium carbonate (TUMS EX) 750 MG chewable tablet, Chew 1 tablet by mouth in the morning and at bedtime. (0800 & 2000), Disp: , Rfl:    Cholecalciferol (VITAMIN D3) 50 MCG (2000 UT) TABS, Take 2,000 Units by mouth daily. (0800), Disp: , Rfl:    guaiFENesin (MUCINEX) 600 MG 12 hr tablet, Take 600 mg by mouth 2 (two) times daily as needed (congestion.)., Disp: , Rfl:    INCRUSE ELLIPTA 62.5 MCG/ACT AEPB, Inhale 1 puff into the lungs daily. (0800), Disp: , Rfl:    metoprolol succinate (TOPROL-XL) 25 MG 24 hr tablet, Take 25 mg by mouth daily. (0800) Hold if SBP<100 or Pulse <60., Disp: , Rfl:    Multiple Vitamins-Minerals (HEALTHY EYES SUPERVISION 2 PO), Take 1 capsule by mouth in the morning and at bedtime. (0730 & 1730), Disp: , Rfl:    OXYGEN, Inhale 2  L/min into the lungs as needed (shortness of breath OR if pulse ox is <90% on room air)., Disp: , Rfl:    potassium chloride SA (KLOR-CON M) 20 MEQ tablet, Take 20 mEq by mouth daily. (0800), Disp: , Rfl:    Albuterol Sulfate 2.5 MG/0.5ML NEBU, Inhale into the lungs as needed., Disp: , Rfl:    lidocaine-prilocaine (EMLA) cream, Apply to affected area once, Disp: 30 g, Rfl: 3   LUMIGAN 0.01 % SOLN, Place 1 drop into both eyes at bedtime. (2000), Disp: , Rfl:    ondansetron (ZOFRAN) 8 MG tablet, Take 1 tablet (8 mg total) by mouth 2 (two) times daily as needed (Nausea or vomiting). (Patient not taking: Reported on 01/10/2022), Disp: 30 tablet, Rfl: 1   prochlorperazine (COMPAZINE) 10 MG tablet, Take 1 tablet (10 mg total) by mouth every 6 (six) hours as needed (Nausea or vomiting). (Patient not taking: Reported on 01/10/2022), Disp: 30 tablet, Rfl: 1   traMADol (ULTRAM) 50 MG tablet, Take 1 tablet (50 mg total) by mouth every 6 (six) hours as needed. (Patient not taking: Reported on 01/10/2022), Disp: 20 tablet, Rfl:  0 No current facility-administered medications for this visit.  Facility-Administered Medications Ordered in Other Visits:    CARBOplatin (PARAPLATIN) 90 mg in sodium chloride 0.9 % 100 mL chemo infusion, 90 mg, Intravenous, Once, Creig Hines, MD, Last Rate: 218 mL/hr at 01/24/22 1211, 90 mg at 01/24/22 1211  Physical exam:  Vitals:   01/24/22 0907  BP: 106/66  Pulse: 80  Resp: 18  SpO2: 94%  Weight: 93 lb 6.4 oz (42.4 kg)   Physical Exam Constitutional:      General: She is not in acute distress. Cardiovascular:     Rate and Rhythm: Normal rate and regular rhythm.     Heart sounds: Normal heart sounds.  Pulmonary:     Effort: Pulmonary effort is normal.     Breath sounds: Normal breath sounds.  Abdominal:     General: Bowel sounds are normal.     Palpations: Abdomen is soft.  Skin:    General: Skin is warm and dry.  Neurological:     Mental Status: She is alert and oriented to person, place, and time.         Latest Ref Rng & Units 01/24/2022    8:44 AM  CMP  Glucose 70 - 99 mg/dL 98   BUN 8 - 23 mg/dL 17   Creatinine 4.01 - 1.00 mg/dL 0.27   Sodium 253 - 664 mmol/L 136   Potassium 3.5 - 5.1 mmol/L 3.9   Chloride 98 - 111 mmol/L 101   CO2 22 - 32 mmol/L 28   Calcium 8.9 - 10.3 mg/dL 9.3   Total Protein 6.5 - 8.1 g/dL 7.0   Total Bilirubin 0.3 - 1.2 mg/dL 0.7   Alkaline Phos 38 - 126 U/L 75   AST 15 - 41 U/L 19   ALT 0 - 44 U/L 14       Latest Ref Rng & Units 01/24/2022    8:44 AM  CBC  WBC 4.0 - 10.5 K/uL 8.7   Hemoglobin 12.0 - 15.0 g/dL 40.3   Hematocrit 47.4 - 46.0 % 42.6   Platelets 150 - 400 K/uL 261     No images are attached to the encounter.  [place] OR NERVE BLOCK-IMAGE ONLY [facility])  Result Date: 12/28/2021 There is no interpretation for this exam.  This order is for images obtained during a surgical procedure.  Please See "Surgeries" Tab for more information regarding the procedure.     Assessment and plan- Patient is a 76 y.o. female with  clinical prognostic stage IIIb invasive apocrine  carcinoma of the right breast apocrine variety T2 N1 M0 triple negative.  She is here for on treatment assessment prior to cycle 4 of weekly Taxol chemotherapy  Carboplatin was added to chemotherapy regimen last week and she tolerated that well.  This will be cycle 4 of Taxol along with carboplatin.  Plan is to complete total 12 cycles followed by consideration for radiation treatment.  I am not planning to give her anthracycline based chemotherapy given her age and baseline performance status.  She will directly proceed for cycle 5 next week and I will see her back in 2 weeks for cycle 6 of CarboTaxol chemotherapy   Visit Diagnosis 1. Encounter for antineoplastic chemotherapy   2. Malignant neoplasm of upper-outer quadrant of right breast in female, estrogen receptor negative (HCC)      Dr. Owens Shark, MD, MPH [facility] at [facility] [phone] 01/24/2022 12:26 PM

## 2022-01-31 ENCOUNTER — Inpatient Hospital Stay: Payer: Medicare HMO

## 2022-01-31 ENCOUNTER — Inpatient Hospital Stay: Payer: Medicare HMO | Attending: Oncology

## 2022-01-31 VITALS — BP 111/67 | HR 86 | Temp 96.8°F | Resp 18 | Wt 93.0 lb

## 2022-01-31 DIAGNOSIS — C50411 Malignant neoplasm of upper-outer quadrant of right female breast: Secondary | ICD-10-CM | POA: Insufficient documentation

## 2022-01-31 DIAGNOSIS — C773 Secondary and unspecified malignant neoplasm of axilla and upper limb lymph nodes: Secondary | ICD-10-CM | POA: Diagnosis present

## 2022-01-31 DIAGNOSIS — Z5111 Encounter for antineoplastic chemotherapy: Secondary | ICD-10-CM | POA: Insufficient documentation

## 2022-01-31 DIAGNOSIS — G629 Polyneuropathy, unspecified: Secondary | ICD-10-CM | POA: Diagnosis not present

## 2022-01-31 DIAGNOSIS — Z171 Estrogen receptor negative status [ER-]: Secondary | ICD-10-CM | POA: Diagnosis not present

## 2022-01-31 DIAGNOSIS — Z9981 Dependence on supplemental oxygen: Secondary | ICD-10-CM | POA: Diagnosis not present

## 2022-01-31 DIAGNOSIS — Z87891 Personal history of nicotine dependence: Secondary | ICD-10-CM | POA: Insufficient documentation

## 2022-01-31 DIAGNOSIS — Z79899 Other long term (current) drug therapy: Secondary | ICD-10-CM | POA: Diagnosis not present

## 2022-01-31 DIAGNOSIS — J449 Chronic obstructive pulmonary disease, unspecified: Secondary | ICD-10-CM | POA: Diagnosis not present

## 2022-01-31 LAB — CBC WITH DIFFERENTIAL/PLATELET
Abs Immature Granulocytes: 0.19 10*3/uL — ABNORMAL HIGH (ref 0.00–0.07)
Basophils Absolute: 0.1 10*3/uL (ref 0.0–0.1)
Basophils Relative: 1 %
Eosinophils Absolute: 0.1 10*3/uL (ref 0.0–0.5)
Eosinophils Relative: 1 %
HCT: 41.9 % (ref 36.0–46.0)
Hemoglobin: 13.6 g/dL (ref 12.0–15.0)
Immature Granulocytes: 2 %
Lymphocytes Relative: 20 %
Lymphs Abs: 2 10*3/uL (ref 0.7–4.0)
MCH: 30.2 pg (ref 26.0–34.0)
MCHC: 32.5 g/dL (ref 30.0–36.0)
MCV: 93.1 fL (ref 80.0–100.0)
Monocytes Absolute: 0.9 10*3/uL (ref 0.1–1.0)
Monocytes Relative: 9 %
Neutro Abs: 6.8 10*3/uL (ref 1.7–7.7)
Neutrophils Relative %: 67 %
Platelets: 286 10*3/uL (ref 150–400)
RBC: 4.5 MIL/uL (ref 3.87–5.11)
RDW: 15.4 % (ref 11.5–15.5)
WBC: 10.1 10*3/uL (ref 4.0–10.5)
nRBC: 0 % (ref 0.0–0.2)

## 2022-01-31 LAB — COMPREHENSIVE METABOLIC PANEL
ALT: 13 U/L (ref 0–44)
AST: 23 U/L (ref 15–41)
Albumin: 3.8 g/dL (ref 3.5–5.0)
Alkaline Phosphatase: 72 U/L (ref 38–126)
Anion gap: 10 (ref 5–15)
BUN: 19 mg/dL (ref 8–23)
CO2: 28 mmol/L (ref 22–32)
Calcium: 9.8 mg/dL (ref 8.9–10.3)
Chloride: 99 mmol/L (ref 98–111)
Creatinine, Ser: 0.7 mg/dL (ref 0.44–1.00)
GFR, Estimated: >60 mL/min (ref >60)
Glucose, Bld: 133 mg/dL — ABNORMAL HIGH (ref 70–99)
Potassium: 4.4 mmol/L (ref 3.5–5.1)
Sodium: 137 mmol/L (ref 135–145)
Total Bilirubin: 0.7 mg/dL (ref 0.3–1.2)
Total Protein: 6.8 g/dL (ref 6.5–8.1)

## 2022-01-31 MED ORDER — FAMOTIDINE IN NACL 20-0.9 MG/50ML-% IV SOLN
20.0000 mg | Freq: Once | INTRAVENOUS | Status: AC
  Administered 2022-01-31: 20 mg via INTRAVENOUS
  Filled 2022-01-31: qty 50

## 2022-01-31 MED ORDER — SODIUM CHLORIDE 0.9 % IV SOLN
65.0000 mg/m2 | Freq: Once | INTRAVENOUS | Status: AC
  Administered 2022-01-31: 90 mg via INTRAVENOUS
  Filled 2022-01-31: qty 15

## 2022-01-31 MED ORDER — SODIUM CHLORIDE 0.9 % IV SOLN
Freq: Once | INTRAVENOUS | Status: AC
  Filled 2022-01-31: qty 250

## 2022-01-31 MED ORDER — SODIUM CHLORIDE 0.9 % IV SOLN
10.0000 mg | Freq: Once | INTRAVENOUS | Status: AC
  Administered 2022-01-31: 10 mg via INTRAVENOUS
  Filled 2022-01-31: qty 10

## 2022-01-31 MED ORDER — HEPARIN SOD (PORK) LOCK FLUSH 100 UNIT/ML IV SOLN
500.0000 [IU] | Freq: Once | INTRAVENOUS | Status: AC | PRN
  Administered 2022-01-31: 500 [IU]
  Filled 2022-01-31: qty 5

## 2022-01-31 MED ORDER — DIPHENHYDRAMINE HCL 50 MG/ML IJ SOLN
50.0000 mg | Freq: Once | INTRAMUSCULAR | Status: AC
  Administered 2022-01-31: 50 mg via INTRAVENOUS
  Filled 2022-01-31: qty 1

## 2022-01-31 MED ORDER — SODIUM CHLORIDE 0.9 % IV SOLN
85.3500 mg | Freq: Once | INTRAVENOUS | Status: AC
  Administered 2022-01-31: 90 mg via INTRAVENOUS
  Filled 2022-01-31: qty 9

## 2022-01-31 NOTE — Patient Instructions (Signed)
[facility] AT [place]  Discharge Instructions: Thank you for choosing [facility] to provide your oncology and hematology care.  If you have a lab appointment with the [facility], please go directly to the [facility] and check in at the registration area.  Wear comfortable clothing and clothing appropriate for easy access to any Portacath or PICC line.   We strive to give you quality time with your provider. You may need to reschedule your appointment if you arrive late (15 or more minutes).  Arriving late affects you and other patients whose appointments are after yours.  Also, if you miss three or more appointments without notifying the office, you may be dismissed from the clinic at the provider's discretion.      For prescription refill requests, have your pharmacy contact our office and allow 72 hours for refills to be completed.    Today you received the following chemotherapy and/or immunotherapy agents CARBOPLATIN and TAXOL      To help prevent nausea and vomiting after your treatment, we encourage you to take your nausea medication as directed.  BELOW ARE SYMPTOMS THAT SHOULD BE REPORTED IMMEDIATELY: *FEVER GREATER THAN 100.4 F (38 C) OR HIGHER *CHILLS OR SWEATING *NAUSEA AND VOMITING THAT IS NOT CONTROLLED WITH YOUR NAUSEA MEDICATION *UNUSUAL SHORTNESS OF BREATH *UNUSUAL BRUISING OR BLEEDING *URINARY PROBLEMS (pain or burning when urinating, or frequent urination) *BOWEL PROBLEMS (unusual diarrhea, constipation, pain near the anus) TENDERNESS IN MOUTH AND THROAT WITH OR WITHOUT PRESENCE OF ULCERS (sore throat, sores in mouth, or a toothache) UNUSUAL RASH, SWELLING OR PAIN  UNUSUAL VAGINAL DISCHARGE OR ITCHING   Items with * indicate a potential emergency and should be followed up as soon as possible or go to the Emergency Department if any problems should occur.  Please show the CHEMOTHERAPY ALERT CARD or IMMUNOTHERAPY ALERT CARD at  check-in to the Emergency Department and triage nurse.  Should you have questions after your visit or need to cancel or reschedule your appointment, please contact [facility] [place] [facility] [place]  [phone] and follow the prompts.  Office hours are 8:00 a.m. to 4:30 p.m. Monday - Friday. Please note that voicemails left after 4:00 p.m. may not be returned until the following business day.  We are closed weekends and major holidays. You have access to a nurse at all times for urgent questions. Please call the main number to the clinic [phone] and follow the prompts.  For any non-urgent questions, you may also contact your provider using MyChart. We now offer e-Visits for anyone 13 and older to request care online for non-urgent symptoms. For details visit [email or web address].   Also download the MyChart app! Go to the app store, search "MyChart", open the app, select [facility], and log in with your MyChart username and password.  Masks are optional in the cancer centers. If you would like for your care team to wear a mask while they are taking care of you, please let them know. For doctor visits, patients may have with them one support person who is at least 76 years old. At this time, visitors are not allowed in the infusion area.  Carboplatin injection What is this medication? CARBOPLATIN (KAR boe pla tin) is a chemotherapy drug. It targets fast dividing cells, like cancer cells, and causes these cells to die. This medicine is used to treat ovarian cancer and many other cancers. This medicine may be used for other purposes; ask your health care provider  or pharmacist if you have questions. COMMON BRAND NAME(S): Paraplatin What should I tell my care team before I take this medication? They need to know if you have any of these conditions: blood disorders hearing problems kidney disease recent or ongoing radiation therapy an unusual or allergic reaction to carboplatin,  cisplatin, other chemotherapy, other medicines, foods, dyes, or preservatives pregnant or trying to get pregnant breast-feeding How should I use this medication? This drug is usually given as an infusion into a vein. It is administered in a hospital or clinic by a specially trained health care professional. Talk to your pediatrician regarding the use of this medicine in children. Special care may be needed. Overdosage: If you think you have taken too much of this medicine contact a poison control center or emergency room at once. NOTE: This medicine is only for you. Do not share this medicine with others. What if I miss a dose? It is important not to miss a dose. Call your doctor or health care professional if you are unable to keep an appointment. What may interact with this medication? medicines for seizures medicines to increase blood counts like filgrastim, pegfilgrastim, sargramostim some antibiotics like amikacin, gentamicin, neomycin, streptomycin, tobramycin vaccines Talk to your doctor or health care professional before taking any of these medicines: acetaminophen aspirin ibuprofen ketoprofen naproxen This list may not describe all possible interactions. Give your health care provider a list of all the medicines, herbs, non-prescription drugs, or dietary supplements you use. Also tell them if you smoke, drink alcohol, or use illegal drugs. Some items may interact with your medicine. What should I watch for while using this medication? Your condition will be monitored carefully while you are receiving this medicine. You will need important blood work done while you are taking this medicine. This drug may make you feel generally unwell. This is not uncommon, as chemotherapy can affect healthy cells as well as cancer cells. Report any side effects. Continue your course of treatment even though you feel ill unless your doctor tells you to stop. In some cases, you may be given additional  medicines to help with side effects. Follow all directions for their use. Call your doctor or health care professional for advice if you get a fever, chills or sore throat, or other symptoms of a cold or flu. Do not treat yourself. This drug decreases your body's ability to fight infections. Try to avoid being around people who are sick. This medicine may increase your risk to bruise or bleed. Call your doctor or health care professional if you notice any unusual bleeding. Be careful brushing and flossing your teeth or using a toothpick because you may get an infection or bleed more easily. If you have any dental work done, tell your dentist you are receiving this medicine. Avoid taking products that contain aspirin, acetaminophen, ibuprofen, naproxen, or ketoprofen unless instructed by your doctor. These medicines may hide a fever. Do not become pregnant while taking this medicine. Women should inform their doctor if they wish to become pregnant or think they might be pregnant. There is a potential for serious side effects to an unborn child. Talk to your health care professional or pharmacist for more information. Do not breast-feed an infant while taking this medicine. What side effects may I notice from receiving this medication? Side effects that you should report to your doctor or health care professional as soon as possible: allergic reactions like skin rash, itching or hives, swelling of the face, lips, or  tongue signs of infection - fever or chills, cough, sore throat, pain or difficulty passing urine signs of decreased platelets or bleeding - bruising, pinpoint red spots on the skin, black, tarry stools, nosebleeds signs of decreased red blood cells - unusually weak or tired, fainting spells, lightheadedness breathing problems changes in hearing changes in vision chest pain high blood pressure low blood counts - This drug may decrease the number of white blood cells, red blood cells and  platelets. You may be at increased risk for infections and bleeding. nausea and vomiting pain, swelling, redness or irritation at the injection site pain, tingling, numbness in the hands or feet problems with balance, talking, walking trouble passing urine or change in the amount of urine Side effects that usually do not require medical attention (report to your doctor or health care professional if they continue or are bothersome): hair loss loss of appetite metallic taste in the mouth or changes in taste This list may not describe all possible side effects. Call your doctor for medical advice about side effects. You may report side effects to FDA at 1-800-FDA-1088. Where should I keep my medication? This drug is given in a hospital or clinic and will not be stored at home. NOTE: This sheet is a summary. It may not cover all possible information. If you have questions about this medicine, talk to your doctor, pharmacist, or health care provider.  2023 Elsevier/Gold Standard (2007-12-26 00:00:00)  Paclitaxel injection What is this medication? PACLITAXEL (PAK li TAX el) is a chemotherapy drug. It targets fast dividing cells, like cancer cells, and causes these cells to die. This medicine is used to treat ovarian cancer, breast cancer, lung cancer, Kaposi's sarcoma, and other cancers. This medicine may be used for other purposes; ask your health care provider or pharmacist if you have questions. COMMON BRAND NAME(S): Onxol, Taxol What should I tell my care team before I take this medication? They need to know if you have any of these conditions: history of irregular heartbeat liver disease low blood counts, like low white cell, platelet, or red cell counts lung or breathing disease, like asthma tingling of the fingers or toes, or other nerve disorder an unusual or allergic reaction to paclitaxel, alcohol, polyoxyethylated castor oil, other chemotherapy, other medicines, foods, dyes, or  preservatives pregnant or trying to get pregnant breast-feeding How should I use this medication? This drug is given as an infusion into a vein. It is administered in a hospital or clinic by a specially trained health care professional. Talk to your pediatrician regarding the use of this medicine in children. Special care may be needed. Overdosage: If you think you have taken too much of this medicine contact a poison control center or emergency room at once. NOTE: This medicine is only for you. Do not share this medicine with others. What if I miss a dose? It is important not to miss your dose. Call your doctor or health care professional if you are unable to keep an appointment. What may interact with this medication? Do not take this medicine with any of the following medications: live virus vaccines This medicine may also interact with the following medications: antiviral medicines for hepatitis, HIV or AIDS certain antibiotics like erythromycin and clarithromycin certain medicines for fungal infections like ketoconazole and itraconazole certain medicines for seizures like carbamazepine, phenobarbital, phenytoin gemfibrozil nefazodone rifampin St. John's wort This list may not describe all possible interactions. Give your health care provider a list of all the medicines, herbs,  non-prescription drugs, or dietary supplements you use. Also tell them if you smoke, drink alcohol, or use illegal drugs. Some items may interact with your medicine. What should I watch for while using this medication? Your condition will be monitored carefully while you are receiving this medicine. You will need important blood work done while you are taking this medicine. This medicine can cause serious allergic reactions. To reduce your risk you will need to take other medicine(s) before treatment with this medicine. If you experience allergic reactions like skin rash, itching or hives, swelling of the face,  lips, or tongue, tell your doctor or health care professional right away. In some cases, you may be given additional medicines to help with side effects. Follow all directions for their use. This drug may make you feel generally unwell. This is not uncommon, as chemotherapy can affect healthy cells as well as cancer cells. Report any side effects. Continue your course of treatment even though you feel ill unless your doctor tells you to stop. Call your doctor or health care professional for advice if you get a fever, chills or sore throat, or other symptoms of a cold or flu. Do not treat yourself. This drug decreases your body's ability to fight infections. Try to avoid being around people who are sick. This medicine may increase your risk to bruise or bleed. Call your doctor or health care professional if you notice any unusual bleeding. Be careful brushing and flossing your teeth or using a toothpick because you may get an infection or bleed more easily. If you have any dental work done, tell your dentist you are receiving this medicine. Avoid taking products that contain aspirin, acetaminophen, ibuprofen, naproxen, or ketoprofen unless instructed by your doctor. These medicines may hide a fever. Do not become pregnant while taking this medicine. Women should inform their doctor if they wish to become pregnant or think they might be pregnant. There is a potential for serious side effects to an unborn child. Talk to your health care professional or pharmacist for more information. Do not breast-feed an infant while taking this medicine. Men are advised not to father a child while receiving this medicine. This product may contain alcohol. Ask your pharmacist or healthcare provider if this medicine contains alcohol. Be sure to tell all healthcare providers you are taking this medicine. Certain medicines, like metronidazole and disulfiram, can cause an unpleasant reaction when taken with alcohol. The reaction  includes flushing, headache, nausea, vomiting, sweating, and increased thirst. The reaction can last from 30 minutes to several hours. What side effects may I notice from receiving this medication? Side effects that you should report to your doctor or health care professional as soon as possible: allergic reactions like skin rash, itching or hives, swelling of the face, lips, or tongue breathing problems changes in vision fast, irregular heartbeat high or low blood pressure mouth sores pain, tingling, numbness in the hands or feet signs of decreased platelets or bleeding - bruising, pinpoint red spots on the skin, black, tarry stools, blood in the urine signs of decreased red blood cells - unusually weak or tired, feeling faint or lightheaded, falls signs of infection - fever or chills, cough, sore throat, pain or difficulty passing urine signs and symptoms of liver injury like dark yellow or brown urine; general ill feeling or flu-like symptoms; light-colored stools; loss of appetite; nausea; right upper belly pain; unusually weak or tired; yellowing of the eyes or skin swelling of the ankles, feet, hands unusually  slow heartbeat Side effects that usually do not require medical attention (report to your doctor or health care professional if they continue or are bothersome): diarrhea hair loss loss of appetite muscle or joint pain nausea, vomiting pain, redness, or irritation at site where injected tiredness This list may not describe all possible side effects. Call your doctor for medical advice about side effects. You may report side effects to FDA at 1-800-FDA-1088. Where should I keep my medication? This drug is given in a hospital or clinic and will not be stored at home. NOTE: This sheet is a summary. It may not cover all possible information. If you have questions about this medicine, talk to your doctor, pharmacist, or health care provider.  2023 Elsevier/Gold Standard  (2021-06-18 00:00:00)

## 2022-02-07 ENCOUNTER — Inpatient Hospital Stay (HOSPITAL_BASED_OUTPATIENT_CLINIC_OR_DEPARTMENT_OTHER): Payer: Medicare HMO | Admitting: Oncology

## 2022-02-07 ENCOUNTER — Inpatient Hospital Stay: Payer: Medicare HMO

## 2022-02-07 ENCOUNTER — Encounter: Payer: Self-pay | Admitting: Oncology

## 2022-02-07 VITALS — BP 120/58 | HR 83 | Temp 98.1°F | Resp 16 | Ht 62.0 in | Wt 93.0 lb

## 2022-02-07 DIAGNOSIS — C50411 Malignant neoplasm of upper-outer quadrant of right female breast: Secondary | ICD-10-CM

## 2022-02-07 DIAGNOSIS — Z171 Estrogen receptor negative status [ER-]: Secondary | ICD-10-CM | POA: Diagnosis not present

## 2022-02-07 DIAGNOSIS — Z5111 Encounter for antineoplastic chemotherapy: Secondary | ICD-10-CM | POA: Diagnosis not present

## 2022-02-07 LAB — COMPREHENSIVE METABOLIC PANEL
ALT: 12 U/L (ref 0–44)
AST: 21 U/L (ref 15–41)
Albumin: 3.7 g/dL (ref 3.5–5.0)
Alkaline Phosphatase: 61 U/L (ref 38–126)
Anion gap: 7 (ref 5–15)
BUN: 16 mg/dL (ref 8–23)
CO2: 29 mmol/L (ref 22–32)
Calcium: 9.6 mg/dL (ref 8.9–10.3)
Chloride: 100 mmol/L (ref 98–111)
Creatinine, Ser: 0.44 mg/dL (ref 0.44–1.00)
GFR, Estimated: >60 mL/min (ref >60)
Glucose, Bld: 111 mg/dL — ABNORMAL HIGH (ref 70–99)
Potassium: 4 mmol/L (ref 3.5–5.1)
Sodium: 136 mmol/L (ref 135–145)
Total Bilirubin: 0.8 mg/dL (ref 0.3–1.2)
Total Protein: 6.6 g/dL (ref 6.5–8.1)

## 2022-02-07 LAB — CBC WITH DIFFERENTIAL/PLATELET
Abs Immature Granulocytes: 0.12 10*3/uL — ABNORMAL HIGH (ref 0.00–0.07)
Basophils Absolute: 0.1 10*3/uL (ref 0.0–0.1)
Basophils Relative: 1 %
Eosinophils Absolute: 0.1 10*3/uL (ref 0.0–0.5)
Eosinophils Relative: 1 %
HCT: 40.3 % (ref 36.0–46.0)
Hemoglobin: 13 g/dL (ref 12.0–15.0)
Immature Granulocytes: 1 %
Lymphocytes Relative: 16 %
Lymphs Abs: 1.3 10*3/uL (ref 0.7–4.0)
MCH: 30.8 pg (ref 26.0–34.0)
MCHC: 32.3 g/dL (ref 30.0–36.0)
MCV: 95.5 fL (ref 80.0–100.0)
Monocytes Absolute: 0.8 10*3/uL (ref 0.1–1.0)
Monocytes Relative: 10 %
Neutro Abs: 6 10*3/uL (ref 1.7–7.7)
Neutrophils Relative %: 71 %
Platelets: 240 10*3/uL (ref 150–400)
RBC: 4.22 MIL/uL (ref 3.87–5.11)
RDW: 15.9 % — ABNORMAL HIGH (ref 11.5–15.5)
WBC: 8.4 10*3/uL (ref 4.0–10.5)
nRBC: 0 % (ref 0.0–0.2)

## 2022-02-07 MED ORDER — SODIUM CHLORIDE 0.9% FLUSH
10.0000 mL | INTRAVENOUS | Status: DC | PRN
  Filled 2022-02-07: qty 10

## 2022-02-07 MED ORDER — DIPHENHYDRAMINE HCL 50 MG/ML IJ SOLN
50.0000 mg | Freq: Once | INTRAMUSCULAR | Status: AC
  Administered 2022-02-07: 50 mg via INTRAVENOUS
  Filled 2022-02-07: qty 1

## 2022-02-07 MED ORDER — SODIUM CHLORIDE 0.9 % IV SOLN
10.0000 mg | Freq: Once | INTRAVENOUS | Status: AC
  Administered 2022-02-07: 10 mg via INTRAVENOUS
  Filled 2022-02-07: qty 1

## 2022-02-07 MED ORDER — HEPARIN SOD (PORK) LOCK FLUSH 100 UNIT/ML IV SOLN
INTRAVENOUS | Status: AC
  Administered 2022-02-07: 500 [IU]
  Filled 2022-02-07: qty 5

## 2022-02-07 MED ORDER — HEPARIN SOD (PORK) LOCK FLUSH 100 UNIT/ML IV SOLN
500.0000 [IU] | Freq: Once | INTRAVENOUS | Status: AC | PRN
  Filled 2022-02-07: qty 5

## 2022-02-07 MED ORDER — SODIUM CHLORIDE 0.9 % IV SOLN
85.3500 mg | Freq: Once | INTRAVENOUS | Status: AC
  Administered 2022-02-07: 90 mg via INTRAVENOUS
  Filled 2022-02-07: qty 9

## 2022-02-07 MED ORDER — SODIUM CHLORIDE 0.9 % IV SOLN
Freq: Once | INTRAVENOUS | Status: AC
  Filled 2022-02-07: qty 250

## 2022-02-07 MED ORDER — FAMOTIDINE IN NACL 20-0.9 MG/50ML-% IV SOLN
20.0000 mg | Freq: Once | INTRAVENOUS | Status: AC
  Administered 2022-02-07: 20 mg via INTRAVENOUS
  Filled 2022-02-07: qty 50

## 2022-02-07 MED ORDER — SODIUM CHLORIDE 0.9 % IV SOLN
65.0000 mg/m2 | Freq: Once | INTRAVENOUS | Status: AC
  Administered 2022-02-07: 90 mg via INTRAVENOUS
  Filled 2022-02-07: qty 15

## 2022-02-07 NOTE — Patient Instructions (Signed)
[facility] AT [place]  Discharge Instructions: Thank you for choosing [facility] to provide your oncology and hematology care.  If you have a lab appointment with the [facility], please go directly to the [facility] and check in at the registration area.  Wear comfortable clothing and clothing appropriate for easy access to any Portacath or PICC line.   We strive to give you quality time with your provider. You may need to reschedule your appointment if you arrive late (15 or more minutes).  Arriving late affects you and other patients whose appointments are after yours.  Also, if you miss three or more appointments without notifying the office, you may be dismissed from the clinic at the provider's discretion.      For prescription refill requests, have your pharmacy contact our office and allow 72 hours for refills to be completed.    Today you received the following chemotherapy and/or immunotherapy agents taxol, carboplatin       To help prevent nausea and vomiting after your treatment, we encourage you to take your nausea medication as directed.  BELOW ARE SYMPTOMS THAT SHOULD BE REPORTED IMMEDIATELY: *FEVER GREATER THAN 100.4 F (38 C) OR HIGHER *CHILLS OR SWEATING *NAUSEA AND VOMITING THAT IS NOT CONTROLLED WITH YOUR NAUSEA MEDICATION *UNUSUAL SHORTNESS OF BREATH *UNUSUAL BRUISING OR BLEEDING *URINARY PROBLEMS (pain or burning when urinating, or frequent urination) *BOWEL PROBLEMS (unusual diarrhea, constipation, pain near the anus) TENDERNESS IN MOUTH AND THROAT WITH OR WITHOUT PRESENCE OF ULCERS (sore throat, sores in mouth, or a toothache) UNUSUAL RASH, SWELLING OR PAIN  UNUSUAL VAGINAL DISCHARGE OR ITCHING   Items with * indicate a potential emergency and should be followed up as soon as possible or go to the Emergency Department if any problems should occur.  Please show the CHEMOTHERAPY ALERT CARD or IMMUNOTHERAPY ALERT CARD at  check-in to the Emergency Department and triage nurse.  Should you have questions after your visit or need to cancel or reschedule your appointment, please contact [facility] [place]  [phone] and follow the prompts.  Office hours are 8:00 a.m. to 4:30 p.m. Monday - Friday. Please note that voicemails left after 4:00 p.m. may not be returned until the following business day.  We are closed weekends and major holidays. You have access to a nurse at all times for urgent questions. Please call the main number to the clinic [phone] and follow the prompts.  For any non-urgent questions, you may also contact your provider using MyChart. We now offer e-Visits for anyone 49 and older to request care online for non-urgent symptoms. For details visit [email or web address].   Also download the MyChart app! Go to the app store, search "MyChart", open the app, select [facility], and log in with your MyChart username and password.  Masks are optional in the cancer centers. If you would like for your care team to wear a mask while they are taking care of you, please let them know. For doctor visits, patients may have with them one support person who is at least 76 years old. At this time, visitors are not allowed in the infusion area.  Carboplatin injection What is this medication? CARBOPLATIN (KAR boe pla tin) is a chemotherapy drug. It targets fast dividing cells, like cancer cells, and causes these cells to die. This medicine is used to treat ovarian cancer and many other cancers. This medicine may be used for other purposes; ask your health care provider  or pharmacist if you have questions. COMMON BRAND NAME(S): Paraplatin What should I tell my care team before I take this medication? They need to know if you have any of these conditions: blood disorders hearing problems kidney disease recent or ongoing radiation therapy an unusual or allergic reaction to carboplatin,  cisplatin, other chemotherapy, other medicines, foods, dyes, or preservatives pregnant or trying to get pregnant breast-feeding How should I use this medication? This drug is usually given as an infusion into a vein. It is administered in a hospital or clinic by a specially trained health care professional. Talk to your pediatrician regarding the use of this medicine in children. Special care may be needed. Overdosage: If you think you have taken too much of this medicine contact a poison control center or emergency room at once. NOTE: This medicine is only for you. Do not share this medicine with others. What if I miss a dose? It is important not to miss a dose. Call your doctor or health care professional if you are unable to keep an appointment. What may interact with this medication? medicines for seizures medicines to increase blood counts like filgrastim, pegfilgrastim, sargramostim some antibiotics like amikacin, gentamicin, neomycin, streptomycin, tobramycin vaccines Talk to your doctor or health care professional before taking any of these medicines: acetaminophen aspirin ibuprofen ketoprofen naproxen This list may not describe all possible interactions. Give your health care provider a list of all the medicines, herbs, non-prescription drugs, or dietary supplements you use. Also tell them if you smoke, drink alcohol, or use illegal drugs. Some items may interact with your medicine. What should I watch for while using this medication? Your condition will be monitored carefully while you are receiving this medicine. You will need important blood work done while you are taking this medicine. This drug may make you feel generally unwell. This is not uncommon, as chemotherapy can affect healthy cells as well as cancer cells. Report any side effects. Continue your course of treatment even though you feel ill unless your doctor tells you to stop. In some cases, you may be given additional  medicines to help with side effects. Follow all directions for their use. Call your doctor or health care professional for advice if you get a fever, chills or sore throat, or other symptoms of a cold or flu. Do not treat yourself. This drug decreases your body's ability to fight infections. Try to avoid being around people who are sick. This medicine may increase your risk to bruise or bleed. Call your doctor or health care professional if you notice any unusual bleeding. Be careful brushing and flossing your teeth or using a toothpick because you may get an infection or bleed more easily. If you have any dental work done, tell your dentist you are receiving this medicine. Avoid taking products that contain aspirin, acetaminophen, ibuprofen, naproxen, or ketoprofen unless instructed by your doctor. These medicines may hide a fever. Do not become pregnant while taking this medicine. Women should inform their doctor if they wish to become pregnant or think they might be pregnant. There is a potential for serious side effects to an unborn child. Talk to your health care professional or pharmacist for more information. Do not breast-feed an infant while taking this medicine. What side effects may I notice from receiving this medication? Side effects that you should report to your doctor or health care professional as soon as possible: allergic reactions like skin rash, itching or hives, swelling of the face, lips, or  tongue signs of infection - fever or chills, cough, sore throat, pain or difficulty passing urine signs of decreased platelets or bleeding - bruising, pinpoint red spots on the skin, black, tarry stools, nosebleeds signs of decreased red blood cells - unusually weak or tired, fainting spells, lightheadedness breathing problems changes in hearing changes in vision chest pain high blood pressure low blood counts - This drug may decrease the number of white blood cells, red blood cells and  platelets. You may be at increased risk for infections and bleeding. nausea and vomiting pain, swelling, redness or irritation at the injection site pain, tingling, numbness in the hands or feet problems with balance, talking, walking trouble passing urine or change in the amount of urine Side effects that usually do not require medical attention (report to your doctor or health care professional if they continue or are bothersome): hair loss loss of appetite metallic taste in the mouth or changes in taste This list may not describe all possible side effects. Call your doctor for medical advice about side effects. You may report side effects to FDA at 1-800-FDA-1088. Where should I keep my medication? This drug is given in a hospital or clinic and will not be stored at home. NOTE: This sheet is a summary. It may not cover all possible information. If you have questions about this medicine, talk to your doctor, pharmacist, or health care provider.  2023 Elsevier/Gold Standard (2007-12-26 00:00:00)   Paclitaxel injection What is this medication? PACLITAXEL (PAK li TAX el) is a chemotherapy drug. It targets fast dividing cells, like cancer cells, and causes these cells to die. This medicine is used to treat ovarian cancer, breast cancer, lung cancer, Kaposi's sarcoma, and other cancers. This medicine may be used for other purposes; ask your health care provider or pharmacist if you have questions. COMMON BRAND NAME(S): Onxol, Taxol What should I tell my care team before I take this medication? They need to know if you have any of these conditions: history of irregular heartbeat liver disease low blood counts, like low white cell, platelet, or red cell counts lung or breathing disease, like asthma tingling of the fingers or toes, or other nerve disorder an unusual or allergic reaction to paclitaxel, alcohol, polyoxyethylated castor oil, other chemotherapy, other medicines, foods, dyes, or  preservatives pregnant or trying to get pregnant breast-feeding How should I use this medication? This drug is given as an infusion into a vein. It is administered in a hospital or clinic by a specially trained health care professional. Talk to your pediatrician regarding the use of this medicine in children. Special care may be needed. Overdosage: If you think you have taken too much of this medicine contact a poison control center or emergency room at once. NOTE: This medicine is only for you. Do not share this medicine with others. What if I miss a dose? It is important not to miss your dose. Call your doctor or health care professional if you are unable to keep an appointment. What may interact with this medication? Do not take this medicine with any of the following medications: live virus vaccines This medicine may also interact with the following medications: antiviral medicines for hepatitis, HIV or AIDS certain antibiotics like erythromycin and clarithromycin certain medicines for fungal infections like ketoconazole and itraconazole certain medicines for seizures like carbamazepine, phenobarbital, phenytoin gemfibrozil nefazodone rifampin St. John's wort This list may not describe all possible interactions. Give your health care provider a list of all the medicines,  herbs, non-prescription drugs, or dietary supplements you use. Also tell them if you smoke, drink alcohol, or use illegal drugs. Some items may interact with your medicine. What should I watch for while using this medication? Your condition will be monitored carefully while you are receiving this medicine. You will need important blood work done while you are taking this medicine. This medicine can cause serious allergic reactions. To reduce your risk you will need to take other medicine(s) before treatment with this medicine. If you experience allergic reactions like skin rash, itching or hives, swelling of the face,  lips, or tongue, tell your doctor or health care professional right away. In some cases, you may be given additional medicines to help with side effects. Follow all directions for their use. This drug may make you feel generally unwell. This is not uncommon, as chemotherapy can affect healthy cells as well as cancer cells. Report any side effects. Continue your course of treatment even though you feel ill unless your doctor tells you to stop. Call your doctor or health care professional for advice if you get a fever, chills or sore throat, or other symptoms of a cold or flu. Do not treat yourself. This drug decreases your body's ability to fight infections. Try to avoid being around people who are sick. This medicine may increase your risk to bruise or bleed. Call your doctor or health care professional if you notice any unusual bleeding. Be careful brushing and flossing your teeth or using a toothpick because you may get an infection or bleed more easily. If you have any dental work done, tell your dentist you are receiving this medicine. Avoid taking products that contain aspirin, acetaminophen, ibuprofen, naproxen, or ketoprofen unless instructed by your doctor. These medicines may hide a fever. Do not become pregnant while taking this medicine. Women should inform their doctor if they wish to become pregnant or think they might be pregnant. There is a potential for serious side effects to an unborn child. Talk to your health care professional or pharmacist for more information. Do not breast-feed an infant while taking this medicine. Men are advised not to father a child while receiving this medicine. This product may contain alcohol. Ask your pharmacist or healthcare provider if this medicine contains alcohol. Be sure to tell all healthcare providers you are taking this medicine. Certain medicines, like metronidazole and disulfiram, can cause an unpleasant reaction when taken with alcohol. The reaction  includes flushing, headache, nausea, vomiting, sweating, and increased thirst. The reaction can last from 30 minutes to several hours. What side effects may I notice from receiving this medication? Side effects that you should report to your doctor or health care professional as soon as possible: allergic reactions like skin rash, itching or hives, swelling of the face, lips, or tongue breathing problems changes in vision fast, irregular heartbeat high or low blood pressure mouth sores pain, tingling, numbness in the hands or feet signs of decreased platelets or bleeding - bruising, pinpoint red spots on the skin, black, tarry stools, blood in the urine signs of decreased red blood cells - unusually weak or tired, feeling faint or lightheaded, falls signs of infection - fever or chills, cough, sore throat, pain or difficulty passing urine signs and symptoms of liver injury like dark yellow or brown urine; general ill feeling or flu-like symptoms; light-colored stools; loss of appetite; nausea; right upper belly pain; unusually weak or tired; yellowing of the eyes or skin swelling of the ankles, feet, hands  unusually slow heartbeat Side effects that usually do not require medical attention (report to your doctor or health care professional if they continue or are bothersome): diarrhea hair loss loss of appetite muscle or joint pain nausea, vomiting pain, redness, or irritation at site where injected tiredness This list may not describe all possible side effects. Call your doctor for medical advice about side effects. You may report side effects to FDA at 1-800-FDA-1088. Where should I keep my medication? This drug is given in a hospital or clinic and will not be stored at home. NOTE: This sheet is a summary. It may not cover all possible information. If you have questions about this medicine, talk to your doctor, pharmacist, or health care provider.  2023 Elsevier/Gold Standard  (2021-06-18 00:00:00)

## 2022-02-07 NOTE — Progress Notes (Signed)
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  That incidentally showed right axillary adenopathy and soft tissue in the superior right breast.  This was followed by diagnostic right breast mammogram and ultrasound which showed a 2.3 x 1.6 x 1.2 cm irregular hypoechoic mass in the right breast at the 12 o'clock position 1 cm from the nipple.  Enlarged right axillary lymph node 1.8 cm.  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  Patient has oxygen dependent COPD.     PET CT scan shows FDG avid spiculated nodule with central cavitation in the left upper lobe.  Size an SUV of this nodule was not quantified on the PET scan.  Partially calcified markedly avid right breast lesion with markedly avid right axillary lymph node metastases.  Patient underwent 1 cycle of Taxol chemotherapy while awaiting surgery and underwent final surgery on 12/28/2021.  Final pathology showed18 mm grade 3 invasive mammary carcinoma triple negative. Macrometastases and at least for an micrometastases in 0 lymph nodes.  Largest size of metastatic deposit 30 mm.  Extranodal extension present.  pT1C pN2A   Given age and frailty plan is to  proceed with CarboTaxol chemotherapy alone for 12 cycles  Interval history-reports tolerating chemotherapy well and denies any specific complaints at this time.  Denies any nausea vomiting or diarrhea.  She has mild baseline neuropathy even prior to starting chemotherapy which has remained stable.  ECOG PS- 3 Pain scale- 0   Review of systems- Review of Systems  Constitutional:  Negative for chills, fever, malaise/fatigue and weight loss.  HENT:  Negative for congestion, ear discharge and nosebleeds.   Eyes:  Negative for blurred vision.  Respiratory:  Negative for cough, hemoptysis, sputum production, shortness of breath and wheezing.   Cardiovascular:  Negative for chest pain, palpitations, orthopnea and claudication.  Gastrointestinal:  Negative for abdominal pain, blood in stool, constipation, diarrhea, heartburn, melena, nausea and vomiting.  Genitourinary:  Negative for dysuria, flank pain, frequency, hematuria and urgency.  Musculoskeletal:  Negative for back pain, joint pain and myalgias.  Skin:  Negative for rash.  Neurological:  Negative for dizziness, tingling, focal weakness, seizures, weakness and headaches.  Endo/Heme/Allergies:  Does not bruise/bleed easily.  Psychiatric/Behavioral:  Negative for depression and suicidal ideas. The patient does not have insomnia.       Allergies  Allergen Reactions   Penicillins Rash and Other (See Comments)     Past Medical History:  Diagnosis Date   Arthritis    Cancer ([place])    CHF (congestive heart failure) (HCC)    COPD (chronic obstructive pulmonary disease) (HCC)    Dementia ([place])    Hyperlipidemia      Past Surgical History:  Procedure Laterality Date   BREAST BIOPSY Right 10/14/2021   rt 12:00  [place] bx venus clip path pending   BREAST BIOPSY Right 10/14/2021   rt axilla hydromarker path pending   EYE SURGERY Bilateral 2019   cataract   MASTECTOMY MODIFIED RADICAL Right 12/28/2021   Procedure: MASTECTOMY MODIFIED  RADICAL;  Surgeon: Herbert Pun, MD;  Location: [facility] ORS;  Service: General;  Laterality: Right;   PORTACATH PLACEMENT N/A 11/25/2021   Procedure: INSERTION PORT-A-CATH;  Surgeon: Herbert Pun, MD;  Location: [facility] ORS;  Service: General;  Laterality: N/A;    Social History   Socioeconomic History   Marital status: Divorced    Spouse name: Not on file   Number of children: Not on file   Years of education: Not on file   Highest education level: Not on file  Occupational History   Not on file  Tobacco Use   Smoking status: Former    Types: Cigarettes    Quit date: 10/2019    Years since quitting: 2.2    Passive exposure: Past   Smokeless tobacco: Never  Vaping Use   Vaping Use: Never used  Substance and Sexual Activity   Alcohol use: Not Currently   Drug use: Not Currently   Sexual activity: Not Currently  Other Topics Concern   Not on file  Social History Narrative   Not on file   Social Determinants of Health   Financial Resource Strain: Not on file  Food Insecurity: Not on file  Transportation Needs: Not on file  Physical Activity: Not on file  Stress: Not on file  Social Connections: Not on file  Intimate Partner Violence: Not on file    Family History  Problem Relation Age of Onset   Breast cancer Mother    Lung cancer Sister      Current Outpatient Medications:    acetaminophen (TYLENOL) 325 MG tablet, Take 650 mg by mouth See admin instructions. Take 2 tablets (650 mg) by mouth 3 times daily scheduled (0800, 1400 & 2000) & take 2 tablets (650 mg) by mouth up to twice daily as needed for pain., Disp: , Rfl:    albuterol (VENTOLIN HFA) 108 (90 Base) MCG/ACT inhaler, Inhale 2 puffs into the lungs every 6 (six) hours as needed for shortness of breath or wheezing., Disp: , Rfl:    Albuterol Sulfate 2.5 MG/0.5ML NEBU, Inhale into the lungs as needed., Disp: , Rfl:    alendronate (FOSAMAX) 70 MG tablet, Take 70 mg by mouth every Monday., Disp: ,  Rfl:    ammonium lactate (LAC-HYDRIN) 12 % lotion, Apply 1 application. topically daily. (0800) Lower legs., Disp: , Rfl:    atorvastatin (LIPITOR) 20 MG tablet, Take 20 mg by mouth daily. (0800), Disp: , Rfl:    budesonide (PULMICORT) 0.5 MG/2ML nebulizer solution, Inhale 0.5 mg into the lungs daily. (0800), Disp: , Rfl:    calcium carbonate (TUMS EX) 750 MG chewable tablet, Chew 1 tablet by mouth in the morning and at bedtime. (0800 & 2000), Disp: , Rfl:    Cholecalciferol (VITAMIN D3) 50 MCG (2000 UT) TABS, Take 2,000 Units by mouth daily. (0800), Disp: , Rfl:    guaiFENesin (MUCINEX) 600 MG 12 hr tablet, Take 600 mg by mouth 2 (two) times daily as needed (congestion.)., Disp: , Rfl:    INCRUSE ELLIPTA 62.5 MCG/ACT AEPB, Inhale 1 puff into the lungs daily. (0800), Disp: , Rfl:    lidocaine-prilocaine (EMLA) cream, Apply to affected area once, Disp: 30 g, Rfl: 3   LUMIGAN 0.01 % SOLN, Place 1 drop into both  eyes at bedtime. (2000), Disp: , Rfl:    metoprolol succinate (TOPROL-XL) 25 MG 24 hr tablet, Take 25 mg by mouth daily. (0800) Hold if SBP<100 or Pulse <60., Disp: , Rfl:    Multiple Vitamins-Minerals (HEALTHY EYES SUPERVISION 2 PO), Take 1 capsule by mouth in the morning and at bedtime. (0730 & 1730), Disp: , Rfl:    ondansetron (ZOFRAN) 8 MG tablet, Take 1 tablet (8 mg total) by mouth 2 (two) times daily as needed (Nausea or vomiting). (Patient not taking: Reported on 01/10/2022), Disp: 30 tablet, Rfl: 1   OXYGEN, Inhale 2 L/min into the lungs as needed (shortness of breath OR if pulse ox is <90% on room air)., Disp: , Rfl:    potassium chloride SA (KLOR-CON M) 20 MEQ tablet, Take 20 mEq by mouth daily. (0800), Disp: , Rfl:    prochlorperazine (COMPAZINE) 10 MG tablet, Take 1 tablet (10 mg total) by mouth every 6 (six) hours as needed (Nausea or vomiting). (Patient not taking: Reported on 01/10/2022), Disp: 30 tablet, Rfl: 1   traMADol (ULTRAM) 50 MG tablet, Take 1 tablet (50 mg total) by mouth  every 6 (six) hours as needed. (Patient not taking: Reported on 01/10/2022), Disp: 20 tablet, Rfl: 0  Physical exam:  Vitals:   02/07/22 0845  BP: (!) 120/58  Pulse: 83  Resp: 16  Temp: 98.1 F (36.7 C)  TempSrc: Tympanic  SpO2: 90%  Weight: 93 lb (42.2 kg)  Height: '5\' 2"'$  (1.575 m)   Physical Exam HENT:     Head: Normocephalic and atraumatic.  Eyes:     Pupils: Pupils are equal, round, and reactive to light.  Cardiovascular:     Rate and Rhythm: Normal rate and regular rhythm.     Heart sounds: Normal heart sounds.  Pulmonary:     Effort: Pulmonary effort is normal.     Breath sounds: Normal breath sounds.  Abdominal:     General: Bowel sounds are normal.     Palpations: Abdomen is soft.  Musculoskeletal:     Cervical back: Normal range of motion.  Skin:    General: Skin is warm and dry.  Neurological:     Mental Status: She is alert and oriented to person, place, and time.         Latest Ref Rng & Units 01/31/2022   12:54 PM  CMP  Glucose 70 - 99 mg/dL 133   BUN 8 - 23 mg/dL 19   Creatinine 0.44 - 1.00 mg/dL 0.70   Sodium 135 - 145 mmol/L 137   Potassium 3.5 - 5.1 mmol/L 4.4   Chloride 98 - 111 mmol/L 99   CO2 22 - 32 mmol/L 28   Calcium 8.9 - 10.3 mg/dL 9.8   Total Protein 6.5 - 8.1 g/dL 6.8   Total Bilirubin 0.3 - 1.2 mg/dL 0.7   Alkaline Phos 38 - 126 U/L 72   AST 15 - 41 U/L 23   ALT 0 - 44 U/L 13       Latest Ref Rng & Units 01/31/2022   12:54 PM  CBC  WBC 4.0 - 10.5 K/uL 10.1   Hemoglobin 12.0 - 15.0 g/dL 13.6   Hematocrit 36.0 - 46.0 % 41.9   Platelets 150 - 400 K/uL 286      Assessment and plan- Patient is a 76 y.o. female with clinical prognostic stage IIIb invasive apocrine  carcinoma of the right breast apocrine variety T2 N1 M0 triple negative.  She is here  for on treatment assessment prior to cycle 6 of weekly CarboTaxol chemotherapy  Counts have remained stable and okay to proceed with cycle 6 of CarboTaxol chemotherapy today.  She will  directly proceed for cycle 7 next week and I will see her back in 2 weeks for cycle 8 of CarboTaxol chemotherapy CarboTaxol chemotherapy today  I have been doing chemo cautiously in her case given her frailty and oxygen dependent COPD.  Surprisingly patient has done well with CarboTaxol chemotherapy so far although carboplatin was started with cycle 3.  Given this is a triple negative breast cancer there would ideally be role for immunotherapy as well.  I therefore plan to add Keytruda starting with next cycle given IV every 3 weeks for total of 9 cycles.  Discussed risks and benefits of Keytruda including all but not limited to autoimmune side effects such as colitis pneumonitis, thyroiditis as well as need to monitor kidney and liver functions.  Treatment will be given with a curative intent.  Patient understands and agrees to proceed as planned.   Visit Diagnosis 1. Encounter for antineoplastic chemotherapy   2. Malignant neoplasm of upper-outer quadrant of right breast in female, estrogen receptor negative ([place])      Dr. Randa Evens, MD, MPH [facility] [phone] 02/07/2022 8:32 AM

## 2022-02-14 ENCOUNTER — Ambulatory Visit: Payer: Medicare HMO | Admitting: Oncology

## 2022-02-14 ENCOUNTER — Inpatient Hospital Stay: Payer: Medicare HMO

## 2022-02-14 ENCOUNTER — Encounter: Payer: Self-pay | Admitting: Oncology

## 2022-02-14 MED FILL — Dexamethasone Sodium Phosphate Inj 100 MG/10ML: INTRAMUSCULAR | Qty: 1 | Status: AC

## 2022-02-15 ENCOUNTER — Inpatient Hospital Stay: Payer: Medicare HMO

## 2022-02-15 VITALS — BP 118/66 | HR 74 | Temp 98.1°F | Resp 16 | Wt 93.3 lb

## 2022-02-15 DIAGNOSIS — Z171 Estrogen receptor negative status [ER-]: Secondary | ICD-10-CM

## 2022-02-15 DIAGNOSIS — Z5111 Encounter for antineoplastic chemotherapy: Secondary | ICD-10-CM | POA: Diagnosis not present

## 2022-02-15 LAB — CBC WITH DIFFERENTIAL/PLATELET
Abs Immature Granulocytes: 0.14 10*3/uL — ABNORMAL HIGH (ref 0.00–0.07)
Basophils Absolute: 0.1 10*3/uL (ref 0.0–0.1)
Basophils Relative: 1 %
Eosinophils Absolute: 0.1 10*3/uL (ref 0.0–0.5)
Eosinophils Relative: 1 %
HCT: 39 % (ref 36.0–46.0)
Hemoglobin: 12.6 g/dL (ref 12.0–15.0)
Immature Granulocytes: 2 %
Lymphocytes Relative: 18 %
Lymphs Abs: 1.3 10*3/uL (ref 0.7–4.0)
MCH: 31.1 pg (ref 26.0–34.0)
MCHC: 32.3 g/dL (ref 30.0–36.0)
MCV: 96.3 fL (ref 80.0–100.0)
Monocytes Absolute: 0.7 10*3/uL (ref 0.1–1.0)
Monocytes Relative: 9 %
Neutro Abs: 5.3 10*3/uL (ref 1.7–7.7)
Neutrophils Relative %: 69 %
Platelets: 211 10*3/uL (ref 150–400)
RBC: 4.05 MIL/uL (ref 3.87–5.11)
RDW: 16.6 % — ABNORMAL HIGH (ref 11.5–15.5)
WBC: 7.6 10*3/uL (ref 4.0–10.5)
nRBC: 0 % (ref 0.0–0.2)

## 2022-02-15 LAB — COMPREHENSIVE METABOLIC PANEL
ALT: 13 U/L (ref 0–44)
AST: 22 U/L (ref 15–41)
Albumin: 3.6 g/dL (ref 3.5–5.0)
Alkaline Phosphatase: 66 U/L (ref 38–126)
Anion gap: 7 (ref 5–15)
BUN: 20 mg/dL (ref 8–23)
CO2: 28 mmol/L (ref 22–32)
Calcium: 9.1 mg/dL (ref 8.9–10.3)
Chloride: 103 mmol/L (ref 98–111)
Creatinine, Ser: 0.53 mg/dL (ref 0.44–1.00)
GFR, Estimated: >60 mL/min (ref >60)
Glucose, Bld: 155 mg/dL — ABNORMAL HIGH (ref 70–99)
Potassium: 4.1 mmol/L (ref 3.5–5.1)
Sodium: 138 mmol/L (ref 135–145)
Total Bilirubin: 0.5 mg/dL (ref 0.3–1.2)
Total Protein: 6.5 g/dL (ref 6.5–8.1)

## 2022-02-15 MED ORDER — HEPARIN SOD (PORK) LOCK FLUSH 100 UNIT/ML IV SOLN
500.0000 [IU] | Freq: Once | INTRAVENOUS | Status: AC
  Administered 2022-02-15: 500 [IU] via INTRAVENOUS
  Filled 2022-02-15: qty 5

## 2022-02-15 MED ORDER — SODIUM CHLORIDE 0.9 % IV SOLN
85.3500 mg | Freq: Once | INTRAVENOUS | Status: AC
  Administered 2022-02-15: 90 mg via INTRAVENOUS
  Filled 2022-02-15: qty 9

## 2022-02-15 MED ORDER — SODIUM CHLORIDE 0.9 % IV SOLN
10.0000 mg | Freq: Once | INTRAVENOUS | Status: AC
  Administered 2022-02-15: 10 mg via INTRAVENOUS
  Filled 2022-02-15: qty 1
  Filled 2022-02-15: qty 10

## 2022-02-15 MED ORDER — SODIUM CHLORIDE 0.9 % IV SOLN: 200.0000 mg | Freq: Once | INTRAVENOUS | Status: DC

## 2022-02-15 MED ORDER — SODIUM CHLORIDE 0.9 % IV SOLN
65.0000 mg/m2 | Freq: Once | INTRAVENOUS | Status: AC
  Administered 2022-02-15: 90 mg via INTRAVENOUS
  Filled 2022-02-15: qty 15

## 2022-02-15 MED ORDER — FAMOTIDINE IN NACL 20-0.9 MG/50ML-% IV SOLN
20.0000 mg | Freq: Once | INTRAVENOUS | Status: AC
  Administered 2022-02-15: 20 mg via INTRAVENOUS
  Filled 2022-02-15: qty 50

## 2022-02-15 MED ORDER — SODIUM CHLORIDE 0.9 % IV SOLN
Freq: Once | INTRAVENOUS | Status: AC
  Filled 2022-02-15: qty 250

## 2022-02-15 MED ORDER — SODIUM CHLORIDE 0.9% FLUSH
10.0000 mL | Freq: Once | INTRAVENOUS | Status: AC
  Administered 2022-02-15: 10 mL via INTRAVENOUS
  Filled 2022-02-15: qty 10

## 2022-02-15 MED ORDER — DIPHENHYDRAMINE HCL 50 MG/ML IJ SOLN
50.0000 mg | Freq: Once | INTRAMUSCULAR | Status: AC
  Administered 2022-02-15: 50 mg via INTRAVENOUS
  Filled 2022-02-15: qty 1

## 2022-02-15 NOTE — Patient Instructions (Signed)
[facility]  Discharge Instructions: Thank you for choosing [facility] to provide your oncology and hematology care.  If you have a lab appointment with the Cancer Center, please go directly to the Cancer Center and check in at the registration area.  Wear comfortable clothing and clothing appropriate for easy access to any Portacath or PICC line.   We strive to give you quality time with your provider. You may need to reschedule your appointment if you arrive late (15 or more minutes).  Arriving late affects you and other patients whose appointments are after yours.  Also, if you miss three or more appointments without notifying the office, you may be dismissed from the clinic at the provider's discretion.      For prescription refill requests, have your pharmacy contact our office and allow 72 hours for refills to be completed.       To help prevent nausea and vomiting after your treatment, we encourage you to take your nausea medication as directed.  BELOW ARE SYMPTOMS THAT SHOULD BE REPORTED IMMEDIATELY: *FEVER GREATER THAN 100.4 F (38 C) OR HIGHER *CHILLS OR SWEATING *NAUSEA AND VOMITING THAT IS NOT CONTROLLED WITH YOUR NAUSEA MEDICATION *UNUSUAL SHORTNESS OF BREATH *UNUSUAL BRUISING OR BLEEDING *URINARY PROBLEMS (pain or burning when urinating, or frequent urination) *BOWEL PROBLEMS (unusual diarrhea, constipation, pain near the anus) TENDERNESS IN MOUTH AND THROAT WITH OR WITHOUT PRESENCE OF ULCERS (sore throat, sores in mouth, or a toothache) UNUSUAL RASH, SWELLING OR PAIN  UNUSUAL VAGINAL DISCHARGE OR ITCHING   Items with * indicate a potential emergency and should be followed up as soon as possible or go to the Emergency Department if any problems should occur.  Please show the CHEMOTHERAPY ALERT CARD or IMMUNOTHERAPY ALERT CARD at check-in to the Emergency Department and triage nurse.  Should you have questions after your  visit or need to cancel or reschedule your appointment, please contact [facility] [place]  [phone] and follow the prompts.  Office hours are 8:00 a.m. to 4:30 p.m. Monday - Friday. Please note that voicemails left after 4:00 p.m. may not be returned until the following business day.  We are closed weekends and major holidays. You have access to a nurse at all times for urgent questions. Please call the main number to the clinic [phone] and follow the prompts.  For any non-urgent questions, you may also contact your provider using MyChart. We now offer e-Visits for anyone 18 and older to request care online for non-urgent symptoms. For details visit [email or web address].   Also download the MyChart app! Go to the app store, search "MyChart", open the app, select [facility], and log in with your MyChart username and password.  Masks are optional in the cancer centers. If you would like for your care team to wear a mask while they are taking care of you, please let them know. For doctor visits, patients may have with them one support person who is at least 76 years old. At this time, visitors are not allowed in the infusion area.   

## 2022-02-18 MED FILL — Dexamethasone Sodium Phosphate Inj 100 MG/10ML: INTRAMUSCULAR | Qty: 1 | Status: AC

## 2022-02-21 ENCOUNTER — Inpatient Hospital Stay: Payer: Medicare HMO

## 2022-02-21 ENCOUNTER — Other Ambulatory Visit: Payer: Self-pay

## 2022-02-21 ENCOUNTER — Encounter: Payer: Self-pay | Admitting: Oncology

## 2022-02-21 ENCOUNTER — Inpatient Hospital Stay (HOSPITAL_BASED_OUTPATIENT_CLINIC_OR_DEPARTMENT_OTHER): Payer: Medicare HMO | Admitting: Oncology

## 2022-02-21 ENCOUNTER — Other Ambulatory Visit: Payer: Self-pay | Admitting: *Deleted

## 2022-02-21 VITALS — BP 112/68 | HR 59 | Temp 98.5°F | Resp 14 | Wt 92.7 lb

## 2022-02-21 DIAGNOSIS — C50411 Malignant neoplasm of upper-outer quadrant of right female breast: Secondary | ICD-10-CM | POA: Diagnosis not present

## 2022-02-21 DIAGNOSIS — R911 Solitary pulmonary nodule: Secondary | ICD-10-CM | POA: Insufficient documentation

## 2022-02-21 DIAGNOSIS — Z171 Estrogen receptor negative status [ER-]: Secondary | ICD-10-CM

## 2022-02-21 DIAGNOSIS — E119 Type 2 diabetes mellitus without complications: Secondary | ICD-10-CM | POA: Insufficient documentation

## 2022-02-21 DIAGNOSIS — Z5112 Encounter for antineoplastic immunotherapy: Secondary | ICD-10-CM | POA: Diagnosis not present

## 2022-02-21 DIAGNOSIS — I509 Heart failure, unspecified: Secondary | ICD-10-CM | POA: Insufficient documentation

## 2022-02-21 DIAGNOSIS — Z5111 Encounter for antineoplastic chemotherapy: Secondary | ICD-10-CM | POA: Diagnosis not present

## 2022-02-21 LAB — CBC WITH DIFFERENTIAL/PLATELET
Abs Immature Granulocytes: 0.07 10*3/uL (ref 0.00–0.07)
Basophils Absolute: 0.1 10*3/uL (ref 0.0–0.1)
Basophils Relative: 1 %
Eosinophils Absolute: 0.1 10*3/uL (ref 0.0–0.5)
Eosinophils Relative: 1 %
HCT: 40.6 % (ref 36.0–46.0)
Hemoglobin: 12.9 g/dL (ref 12.0–15.0)
Immature Granulocytes: 1 %
Lymphocytes Relative: 18 %
Lymphs Abs: 1.5 10*3/uL (ref 0.7–4.0)
MCH: 30.7 pg (ref 26.0–34.0)
MCHC: 31.8 g/dL (ref 30.0–36.0)
MCV: 96.7 fL (ref 80.0–100.0)
Monocytes Absolute: 0.7 10*3/uL (ref 0.1–1.0)
Monocytes Relative: 8 %
Neutro Abs: 5.8 10*3/uL (ref 1.7–7.7)
Neutrophils Relative %: 71 %
Platelets: 201 10*3/uL (ref 150–400)
RBC: 4.2 MIL/uL (ref 3.87–5.11)
RDW: 17.2 % — ABNORMAL HIGH (ref 11.5–15.5)
WBC: 8.2 10*3/uL (ref 4.0–10.5)
nRBC: 0 % (ref 0.0–0.2)

## 2022-02-21 LAB — COMPREHENSIVE METABOLIC PANEL
ALT: 14 U/L (ref 0–44)
AST: 23 U/L (ref 15–41)
Albumin: 3.8 g/dL (ref 3.5–5.0)
Alkaline Phosphatase: 60 U/L (ref 38–126)
Anion gap: 6 (ref 5–15)
BUN: 20 mg/dL (ref 8–23)
CO2: 28 mmol/L (ref 22–32)
Calcium: 9.9 mg/dL (ref 8.9–10.3)
Chloride: 101 mmol/L (ref 98–111)
Creatinine, Ser: 0.53 mg/dL (ref 0.44–1.00)
GFR, Estimated: >60 mL/min (ref >60)
Glucose, Bld: 141 mg/dL — ABNORMAL HIGH (ref 70–99)
Potassium: 4.2 mmol/L (ref 3.5–5.1)
Sodium: 135 mmol/L (ref 135–145)
Total Bilirubin: 0.6 mg/dL (ref 0.3–1.2)
Total Protein: 6.9 g/dL (ref 6.5–8.1)

## 2022-02-21 MED ORDER — SODIUM CHLORIDE 0.9 % IV SOLN
Freq: Once | INTRAVENOUS | Status: AC
  Filled 2022-02-21: qty 250

## 2022-02-21 MED ORDER — FAMOTIDINE IN NACL 20-0.9 MG/50ML-% IV SOLN
20.0000 mg | Freq: Once | INTRAVENOUS | Status: AC
  Administered 2022-02-21: 20 mg via INTRAVENOUS
  Filled 2022-02-21: qty 50

## 2022-02-21 MED ORDER — SODIUM CHLORIDE 0.9% FLUSH
10.0000 mL | Freq: Once | INTRAVENOUS | Status: AC
  Administered 2022-02-21: 10 mL via INTRAVENOUS
  Filled 2022-02-21: qty 10

## 2022-02-21 MED ORDER — SODIUM CHLORIDE 0.9 % IV SOLN
65.0000 mg/m2 | Freq: Once | INTRAVENOUS | Status: AC
  Administered 2022-02-21: 90 mg via INTRAVENOUS
  Filled 2022-02-21: qty 15

## 2022-02-21 MED ORDER — HEPARIN SOD (PORK) LOCK FLUSH 100 UNIT/ML IV SOLN
INTRAVENOUS | Status: AC
  Administered 2022-02-21: 500 [IU]
  Filled 2022-02-21: qty 5

## 2022-02-21 MED ORDER — SODIUM CHLORIDE 0.9 % IV SOLN
10.0000 mg | Freq: Once | INTRAVENOUS | Status: AC
  Administered 2022-02-21: 10 mg via INTRAVENOUS
  Filled 2022-02-21: qty 10

## 2022-02-21 MED ORDER — DIPHENHYDRAMINE HCL 50 MG/ML IJ SOLN
50.0000 mg | Freq: Once | INTRAMUSCULAR | Status: AC
  Administered 2022-02-21: 50 mg via INTRAVENOUS
  Filled 2022-02-21: qty 1

## 2022-02-21 MED ORDER — HEPARIN SOD (PORK) LOCK FLUSH 100 UNIT/ML IV SOLN
500.0000 [IU] | Freq: Once | INTRAVENOUS | Status: AC | PRN
  Filled 2022-02-21: qty 5

## 2022-02-21 MED ORDER — SODIUM CHLORIDE 0.9 % IV SOLN
85.3500 mg | Freq: Once | INTRAVENOUS | Status: AC
  Administered 2022-02-21: 90 mg via INTRAVENOUS
  Filled 2022-02-21: qty 9

## 2022-02-21 NOTE — Patient Instructions (Signed)
[facility]  Discharge Instructions: Thank you for choosing [facility] to provide your oncology and hematology care.  If you have a lab appointment with the Cancer Center, please go directly to the Cancer Center and check in at the registration area.  Wear comfortable clothing and clothing appropriate for easy access to any Portacath or PICC line.   We strive to give you quality time with your provider. You may need to reschedule your appointment if you arrive late (15 or more minutes).  Arriving late affects you and other patients whose appointments are after yours.  Also, if you miss three or more appointments without notifying the office, you may be dismissed from the clinic at the provider's discretion.      For prescription refill requests, have your pharmacy contact our office and allow 72 hours for refills to be completed.    Today you received the following chemotherapy and/or immunotherapy agents Taxol & Carboplatin    To help prevent nausea and vomiting after your treatment, we encourage you to take your nausea medication as directed.  BELOW ARE SYMPTOMS THAT SHOULD BE REPORTED IMMEDIATELY: *FEVER GREATER THAN 100.4 F (38 C) OR HIGHER *CHILLS OR SWEATING *NAUSEA AND VOMITING THAT IS NOT CONTROLLED WITH YOUR NAUSEA MEDICATION *UNUSUAL SHORTNESS OF BREATH *UNUSUAL BRUISING OR BLEEDING *URINARY PROBLEMS (pain or burning when urinating, or frequent urination) *BOWEL PROBLEMS (unusual diarrhea, constipation, pain near the anus) TENDERNESS IN MOUTH AND THROAT WITH OR WITHOUT PRESENCE OF ULCERS (sore throat, sores in mouth, or a toothache) UNUSUAL RASH, SWELLING OR PAIN  UNUSUAL VAGINAL DISCHARGE OR ITCHING   Items with * indicate a potential emergency and should be followed up as soon as possible or go to the Emergency Department if any problems should occur.  Please show the CHEMOTHERAPY ALERT CARD or IMMUNOTHERAPY ALERT CARD at  check-in to the Emergency Department and triage nurse.  Should you have questions after your visit or need to cancel or reschedule your appointment, please contact [facility] [place]  [phone] and follow the prompts.  Office hours are 8:00 a.m. to 4:30 p.m. Monday - Friday. Please note that voicemails left after 4:00 p.m. may not be returned until the following business day.  We are closed weekends and major holidays. You have access to a nurse at all times for urgent questions. Please call the main number to the clinic [phone] and follow the prompts.  For any non-urgent questions, you may also contact your provider using MyChart. We now offer e-Visits for anyone 18 and older to request care online for non-urgent symptoms. For details visit [email or web address].   Also download the MyChart app! Go to the app store, search "MyChart", open the app, select [facility], and log in with your MyChart username and password.  Masks are optional in the cancer centers. If you would like for your care team to wear a mask while they are taking care of you, please let them know. For doctor visits, patients may have with them one support person who is at least 76 years old. At this time, visitors are not allowed in the infusion area.   

## 2022-02-21 NOTE — Progress Notes (Signed)
Pt had a annual home well visit from [employer] where she was informed that she has a partial blockage in her leg. Son believes it is due to pt not moving around much throughout the day.

## 2022-02-21 NOTE — Progress Notes (Signed)
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  That incidentally showed right axillary adenopathy and soft tissue in the superior right breast.  This was followed by diagnostic right breast mammogram and ultrasound which showed a 2.3 x 1.6 x 1.2 cm irregular hypoechoic mass in the right breast at the 12 o'clock position 1 cm from the nipple.  Enlarged right axillary lymph node 1.8 cm.  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  Patient has oxygen dependent COPD.     PET CT scan shows FDG avid spiculated nodule with central cavitation in the left upper lobe.  Size an SUV of this nodule was not quantified on the PET scan.  Partially calcified markedly avid right breast lesion with markedly avid right axillary lymph node metastases.  Patient underwent 1 cycle of Taxol chemotherapy while awaiting surgery and underwent final surgery on 12/28/2021.  Final pathology showed18 mm grade 3 invasive mammary carcinoma triple negative. Macrometastases and at least for an micrometastases in 0 lymph nodes.  Largest size of metastatic deposit 30 mm.  Extranodal extension present.  pT1C pN2A   Given age and frailty plan is  to proceed with CarboTaxol chemotherapy alone for 12 cycles    Interval history-tolerating chemotherapy well.  Denies any worsening of her neuropathy.  She is now starting to lose her hair which is bothering her to some extent.  ECOG PS- 2 Pain scale- 0   Review of systems- Review of Systems  Constitutional:  Positive for malaise/fatigue. Negative for chills, fever and weight loss.  HENT:  Negative for congestion, ear discharge and nosebleeds.   Eyes:  Negative for blurred vision.  Respiratory:  Negative for cough, hemoptysis, sputum production, shortness of breath and wheezing.   Cardiovascular:  Negative for chest pain, palpitations, orthopnea and claudication.  Gastrointestinal:  Negative for abdominal pain, blood in stool, constipation, diarrhea, heartburn, melena, nausea and vomiting.  Genitourinary:  Negative for dysuria, flank pain, frequency, hematuria and urgency.  Musculoskeletal:  Negative for back pain, joint pain and myalgias.  Skin:  Negative for rash.  Neurological:  Negative for dizziness, tingling, focal weakness, seizures, weakness and headaches.  Endo/Heme/Allergies:  Does not bruise/bleed easily.  Psychiatric/Behavioral:  Negative for depression and suicidal ideas. The patient does not have insomnia.       Allergies  Allergen Reactions   Penicillins Rash and Other (See Comments)     Past Medical History:  Diagnosis Date   Arthritis    Cancer ([place])    CHF (congestive heart failure) (HCC)    COPD (chronic obstructive pulmonary disease) (HCC)    Dementia ([place])    Hyperlipidemia      Past Surgical History:  Procedure Laterality Date   BREAST BIOPSY Right 10/14/2021   rt 12:00 [place] bx venus clip  path pending   BREAST BIOPSY Right 10/14/2021   rt axilla hydromarker path pending   EYE SURGERY Bilateral 2019   cataract   MASTECTOMY MODIFIED RADICAL Right 12/28/2021   Procedure: MASTECTOMY MODIFIED RADICAL;  Surgeon: Herbert Pun, MD;  Location:  [facility] ORS;  Service: General;  Laterality: Right;   PORTACATH PLACEMENT N/A 11/25/2021   Procedure: INSERTION PORT-A-CATH;  Surgeon: Herbert Pun, MD;  Location: [facility] ORS;  Service: General;  Laterality: N/A;    Social History   Socioeconomic History   Marital status: Divorced    Spouse name: Not on file   Number of children: Not on file   Years of education: Not on file   Highest education level: Not on file  Occupational History   Not on file  Tobacco Use   Smoking status: Former    Types: Cigarettes    Quit date: 10/2019    Years since quitting: 2.3    Passive exposure: Past   Smokeless tobacco: Never  Vaping Use   Vaping Use: Never used  Substance and Sexual Activity   Alcohol use: Not Currently   Drug use: Not Currently   Sexual activity: Not Currently  Other Topics Concern   Not on file  Social History Narrative   Not on file   Social Determinants of Health   Financial Resource Strain: Not on file  Food Insecurity: Not on file  Transportation Needs: Not on file  Physical Activity: Not on file  Stress: Not on file  Social Connections: Not on file  Intimate Partner Violence: Not on file    Family History  Problem Relation Age of Onset   Breast cancer Mother    Lung cancer Sister      Current Outpatient Medications:    acetaminophen (TYLENOL) 325 MG tablet, Take 650 mg by mouth See admin instructions. Take 2 tablets (650 mg) by mouth 3 times daily scheduled (0800, 1400 & 2000) & take 2 tablets (650 mg) by mouth up to twice daily as needed for pain., Disp: , Rfl:    albuterol (VENTOLIN HFA) 108 (90 Base) MCG/ACT inhaler, Inhale 2 puffs into the lungs every 6 (six) hours as needed for shortness of breath or wheezing., Disp: , Rfl:    alendronate (FOSAMAX) 70 MG tablet, Take 70 mg by mouth every Monday., Disp: , Rfl:    ammonium lactate (LAC-HYDRIN) 12 % lotion, Apply 1 application. topically daily. (0800) Lower legs., Disp: , Rfl:    atorvastatin  (LIPITOR) 20 MG tablet, Take 20 mg by mouth daily. (0800), Disp: , Rfl:    budesonide (PULMICORT) 0.5 MG/2ML nebulizer solution, Inhale 0.5 mg into the lungs daily. (0800), Disp: , Rfl:    calcium carbonate (TUMS EX) 750 MG chewable tablet, Chew 1 tablet by mouth in the morning and at bedtime. (0800 & 2000), Disp: , Rfl:    Cholecalciferol (VITAMIN D3) 50 MCG (2000 UT) TABS, Take 2,000 Units by mouth daily. (0800), Disp: , Rfl:    lidocaine-prilocaine (EMLA) cream, Apply to affected area once, Disp: 30 g, Rfl: 3   LUMIGAN 0.01 % SOLN, Place 1 drop into both eyes at bedtime. (2000), Disp: , Rfl:    metoprolol succinate (TOPROL-XL) 25 MG 24 hr tablet, Take 25 mg by mouth daily. (0800) Hold if SBP<100 or Pulse <60., Disp: , Rfl:    Multiple Vitamins-Minerals (HEALTHY EYES SUPERVISION 2 PO), Take 1 capsule by mouth in the morning and at bedtime. (0730 & 1730), Disp: , Rfl:    OXYGEN,  Inhale 2 L/min into the lungs as needed (shortness of breath OR if pulse ox is <90% on room air)., Disp: , Rfl:    potassium chloride SA (KLOR-CON M) 20 MEQ tablet, Take 20 mEq by mouth daily. (0800), Disp: , Rfl:    Albuterol Sulfate 2.5 MG/0.5ML NEBU, Inhale into the lungs as needed., Disp: , Rfl:    guaiFENesin (MUCINEX) 600 MG 12 hr tablet, Take 600 mg by mouth 2 (two) times daily as needed (congestion.). (Patient not taking: Reported on 02/21/2022), Disp: , Rfl:    INCRUSE ELLIPTA 62.5 MCG/ACT AEPB, Inhale 1 puff into the lungs daily. (0800), Disp: , Rfl:    ondansetron (ZOFRAN) 8 MG tablet, Take 1 tablet (8 mg total) by mouth 2 (two) times daily as needed (Nausea or vomiting). (Patient not taking: Reported on 01/10/2022), Disp: 30 tablet, Rfl: 1   prochlorperazine (COMPAZINE) 10 MG tablet, Take 1 tablet (10 mg total) by mouth every 6 (six) hours as needed (Nausea or vomiting). (Patient not taking: Reported on 01/10/2022), Disp: 30 tablet, Rfl: 1   traMADol (ULTRAM) 50 MG tablet, Take 1 tablet (50 mg total) by mouth every 6  (six) hours as needed. (Patient not taking: Reported on 01/10/2022), Disp: 20 tablet, Rfl: 0 No current facility-administered medications for this visit.  Facility-Administered Medications Ordered in Other Visits:    [COMPLETED] heparin lock flush 100 unit/mL, 500 Units, Intracatheter, Once PRN, Sindy Guadeloupe, MD, 500 Units at 02/21/22 1322  Physical exam:  Vitals:   02/21/22 0922  BP: 112/68  Pulse: (!) 59  Resp: 14  Temp: 98.5 F (36.9 C)  SpO2: 99%  Weight: 92 lb 11.2 oz (42 kg)   Physical Exam Constitutional:      General: She is not in acute distress.    Comments: She is sitting in a wheelchair and on home oxygen  Cardiovascular:     Rate and Rhythm: Normal rate and regular rhythm.     Heart sounds: Normal heart sounds.  Pulmonary:     Effort: Pulmonary effort is normal.     Breath sounds: Normal breath sounds.  Skin:    General: Skin is warm and dry.  Neurological:     Mental Status: She is alert and oriented to person, place, and time.         Latest Ref Rng & Units 02/21/2022    9:09 AM  CMP  Glucose 70 - 99 mg/dL 141   BUN 8 - 23 mg/dL 20   Creatinine 0.44 - 1.00 mg/dL 0.53   Sodium 135 - 145 mmol/L 135   Potassium 3.5 - 5.1 mmol/L 4.2   Chloride 98 - 111 mmol/L 101   CO2 22 - 32 mmol/L 28   Calcium 8.9 - 10.3 mg/dL 9.9   Total Protein 6.5 - 8.1 g/dL 6.9   Total Bilirubin 0.3 - 1.2 mg/dL 0.6   Alkaline Phos 38 - 126 U/L 60   AST 15 - 41 U/L 23   ALT 0 - 44 U/L 14       Latest Ref Rng & Units 02/21/2022    9:09 AM  CBC  WBC 4.0 - 10.5 K/uL 8.2   Hemoglobin 12.0 - 15.0 g/dL 12.9   Hematocrit 36.0 - 46.0 % 40.6   Platelets 150 - 400 K/uL 201      Assessment and plan- Patient is a 76 y.o. female with clinical prognostic stage IIIb invasive apocrine  carcinoma of the right breast apocrine variety T2 N1 M0  triple negative.  She is here for on treatment assessment prior to cycle 8 of weekly CarboTaxol chemotherapy  Counts okay to proceed with cycle 8  of weekly CarboTaxol chemotherapy today.  She has 4 more weekly CarboTaxol chemotherapy is remaining.  She will be seen by covering NP in 2 weeks and I will see her back in 4 weeks for cycle 12  Patient has triple negative apocrine type of breast cancer.  I did not offer her neoadjuvant chemotherapy because of her age and frailty as well as underlying oxygen dependent COPD.  Ideally for triple negative breast cancer immunotherapy is recommended either neoadjuvantly or adjuvantly.  Even if keynote 522 regimen offered Keytruda both neoadjuvantly and adjuvantly for dose you do not receive neoadjuvant treatment only adjuvant Keytruda could still be considered.  I have been trying to get this approved for her but her insurance denied it.  We are trying to get Keytruda from the drug company.  If they approve it I would like her to start [place] along with CarboTaxol chemotherapy and then continue every 3 weeks for up to 9 cycles.  Patient would also benefit from adjuvant radiation upon completion of chemotherapy I will refer her to radiation oncology at this time although she completes chemo in 4 weeks.  She will need transportation assistance for radiation   Visit Diagnosis 1. Encounter for antineoplastic immunotherapy   2. Malignant neoplasm of upper-outer quadrant of right breast in female, estrogen receptor negative ([place])      Dr. Randa Evens, MD, MPH [facility] [phone] 02/21/2022 1:19 PM

## 2022-02-22 ENCOUNTER — Other Ambulatory Visit: Payer: Self-pay

## 2022-02-25 MED FILL — Dexamethasone Sodium Phosphate Inj 100 MG/10ML: INTRAMUSCULAR | Qty: 1 | Status: AC

## 2022-02-28 ENCOUNTER — Inpatient Hospital Stay: Payer: Medicare HMO

## 2022-02-28 ENCOUNTER — Institutional Professional Consult (permissible substitution): Payer: Medicare HMO | Admitting: Radiation Oncology

## 2022-02-28 VITALS — BP 113/63 | HR 82 | Temp 97.9°F | Resp 16 | Wt 92.8 lb

## 2022-02-28 DIAGNOSIS — Z171 Estrogen receptor negative status [ER-]: Secondary | ICD-10-CM

## 2022-02-28 DIAGNOSIS — Z5111 Encounter for antineoplastic chemotherapy: Secondary | ICD-10-CM | POA: Diagnosis not present

## 2022-02-28 LAB — CBC WITH DIFFERENTIAL/PLATELET
Abs Immature Granulocytes: 0.06 10*3/uL (ref 0.00–0.07)
Basophils Absolute: 0 10*3/uL (ref 0.0–0.1)
Basophils Relative: 1 %
Eosinophils Absolute: 0.1 10*3/uL (ref 0.0–0.5)
Eosinophils Relative: 1 %
HCT: 38.2 % (ref 36.0–46.0)
Hemoglobin: 12.4 g/dL (ref 12.0–15.0)
Immature Granulocytes: 1 %
Lymphocytes Relative: 19 %
Lymphs Abs: 1.4 10*3/uL (ref 0.7–4.0)
MCH: 31.5 pg (ref 26.0–34.0)
MCHC: 32.5 g/dL (ref 30.0–36.0)
MCV: 97 fL (ref 80.0–100.0)
Monocytes Absolute: 0.6 10*3/uL (ref 0.1–1.0)
Monocytes Relative: 8 %
Neutro Abs: 5.5 10*3/uL (ref 1.7–7.7)
Neutrophils Relative %: 70 %
Platelets: 197 10*3/uL (ref 150–400)
RBC: 3.94 MIL/uL (ref 3.87–5.11)
RDW: 17.2 % — ABNORMAL HIGH (ref 11.5–15.5)
WBC: 7.7 10*3/uL (ref 4.0–10.5)
nRBC: 0 % (ref 0.0–0.2)

## 2022-02-28 LAB — COMPREHENSIVE METABOLIC PANEL
ALT: 13 U/L (ref 0–44)
AST: 22 U/L (ref 15–41)
Albumin: 3.8 g/dL (ref 3.5–5.0)
Alkaline Phosphatase: 64 U/L (ref 38–126)
Anion gap: 7 (ref 5–15)
BUN: 19 mg/dL (ref 8–23)
CO2: 27 mmol/L (ref 22–32)
Calcium: 9.3 mg/dL (ref 8.9–10.3)
Chloride: 102 mmol/L (ref 98–111)
Creatinine, Ser: 0.65 mg/dL (ref 0.44–1.00)
GFR, Estimated: >60 mL/min (ref >60)
Glucose, Bld: 129 mg/dL — ABNORMAL HIGH (ref 70–99)
Potassium: 3.9 mmol/L (ref 3.5–5.1)
Sodium: 136 mmol/L (ref 135–145)
Total Bilirubin: 0.5 mg/dL (ref 0.3–1.2)
Total Protein: 6.6 g/dL (ref 6.5–8.1)

## 2022-02-28 MED ORDER — SODIUM CHLORIDE 0.9 % IV SOLN
Freq: Once | INTRAVENOUS | Status: AC
  Filled 2022-02-28: qty 250

## 2022-02-28 MED ORDER — HEPARIN SOD (PORK) LOCK FLUSH 100 UNIT/ML IV SOLN
INTRAVENOUS | Status: AC
  Administered 2022-02-28: 500 [IU]
  Filled 2022-02-28: qty 5

## 2022-02-28 MED ORDER — HEPARIN SOD (PORK) LOCK FLUSH 100 UNIT/ML IV SOLN
500.0000 [IU] | Freq: Once | INTRAVENOUS | Status: AC | PRN
  Filled 2022-02-28: qty 5

## 2022-02-28 MED ORDER — SODIUM CHLORIDE 0.9 % IV SOLN
65.0000 mg/m2 | Freq: Once | INTRAVENOUS | Status: AC
  Administered 2022-02-28: 90 mg via INTRAVENOUS
  Filled 2022-02-28: qty 15

## 2022-02-28 MED ORDER — SODIUM CHLORIDE 0.9 % IV SOLN
10.0000 mg | Freq: Once | INTRAVENOUS | Status: AC
  Administered 2022-02-28: 10 mg via INTRAVENOUS
  Filled 2022-02-28: qty 10

## 2022-02-28 MED ORDER — FAMOTIDINE IN NACL 20-0.9 MG/50ML-% IV SOLN
20.0000 mg | Freq: Once | INTRAVENOUS | Status: AC
  Administered 2022-02-28: 20 mg via INTRAVENOUS
  Filled 2022-02-28: qty 50

## 2022-02-28 MED ORDER — SODIUM CHLORIDE 0.9 % IV SOLN
85.3500 mg | Freq: Once | INTRAVENOUS | Status: AC
  Administered 2022-02-28: 90 mg via INTRAVENOUS
  Filled 2022-02-28: qty 9

## 2022-02-28 MED ORDER — DIPHENHYDRAMINE HCL 50 MG/ML IJ SOLN
50.0000 mg | Freq: Once | INTRAMUSCULAR | Status: AC
  Administered 2022-02-28: 50 mg via INTRAVENOUS
  Filled 2022-02-28: qty 1

## 2022-02-28 NOTE — Patient Instructions (Signed)
[facility]  Discharge Instructions: Thank you for choosing [facility] to provide your oncology and hematology care.  If you have a lab appointment with the Cancer Center, please go directly to the Cancer Center and check in at the registration area.  Wear comfortable clothing and clothing appropriate for easy access to any Portacath or PICC line.   We strive to give you quality time with your provider. You may need to reschedule your appointment if you arrive late (15 or more minutes).  Arriving late affects you and other patients whose appointments are after yours.  Also, if you miss three or more appointments without notifying the office, you may be dismissed from the clinic at the provider's discretion.      For prescription refill requests, have your pharmacy contact our office and allow 72 hours for refills to be completed.    Today you received the following chemotherapy and/or immunotherapy agents Carboplatin & Taxol      To help prevent nausea and vomiting after your treatment, we encourage you to take your nausea medication as directed.  BELOW ARE SYMPTOMS THAT SHOULD BE REPORTED IMMEDIATELY: *FEVER GREATER THAN 100.4 F (38 C) OR HIGHER *CHILLS OR SWEATING *NAUSEA AND VOMITING THAT IS NOT CONTROLLED WITH YOUR NAUSEA MEDICATION *UNUSUAL SHORTNESS OF BREATH *UNUSUAL BRUISING OR BLEEDING *URINARY PROBLEMS (pain or burning when urinating, or frequent urination) *BOWEL PROBLEMS (unusual diarrhea, constipation, pain near the anus) TENDERNESS IN MOUTH AND THROAT WITH OR WITHOUT PRESENCE OF ULCERS (sore throat, sores in mouth, or a toothache) UNUSUAL RASH, SWELLING OR PAIN  UNUSUAL VAGINAL DISCHARGE OR ITCHING   Items with * indicate a potential emergency and should be followed up as soon as possible or go to the Emergency Department if any problems should occur.  Please show the CHEMOTHERAPY ALERT CARD or IMMUNOTHERAPY ALERT CARD at  check-in to the Emergency Department and triage nurse.  Should you have questions after your visit or need to cancel or reschedule your appointment, please contact [facility]  [phone] and follow the prompts.  Office hours are 8:00 a.m. to 4:30 p.m. Monday - Friday. Please note that voicemails left after 4:00 p.m. may not be returned until the following business day.  We are closed weekends and major holidays. You have access to a nurse at all times for urgent questions. Please call the main number to the clinic [phone] and follow the prompts.  For any non-urgent questions, you may also contact your provider using MyChart. We now offer e-Visits for anyone 18 and older to request care online for non-urgent symptoms. For details visit [email or web address].   Also download the MyChart app! Go to the app store, search "MyChart", open the app, select [facility], and log in with your MyChart username and password.  Masks are optional in the cancer centers. If you would like for your care team to wear a mask while they are taking care of you, please let them know. For doctor visits, patients may have with them one support person who is at least 76 years old. At this time, visitors are not allowed in the infusion area.   

## 2022-03-03 ENCOUNTER — Encounter: Payer: Self-pay | Admitting: Radiation Oncology

## 2022-03-03 ENCOUNTER — Ambulatory Visit
Admission: RE | Admit: 2022-03-03 | Discharge: 2022-03-03 | Disposition: A | Discharge status: Home / Self Care | Payer: Medicare HMO | Source: Ambulatory Visit | Attending: Radiation Oncology | Admitting: Radiation Oncology

## 2022-03-03 VITALS — BP 107/77 | HR 92 | Temp 98.4°F | Resp 16

## 2022-03-03 DIAGNOSIS — Z803 Family history of malignant neoplasm of breast: Secondary | ICD-10-CM | POA: Insufficient documentation

## 2022-03-03 DIAGNOSIS — Z87891 Personal history of nicotine dependence: Secondary | ICD-10-CM | POA: Insufficient documentation

## 2022-03-03 DIAGNOSIS — E785 Hyperlipidemia, unspecified: Secondary | ICD-10-CM | POA: Diagnosis not present

## 2022-03-03 DIAGNOSIS — Z79899 Other long term (current) drug therapy: Secondary | ICD-10-CM | POA: Diagnosis not present

## 2022-03-03 DIAGNOSIS — C50411 Malignant neoplasm of upper-outer quadrant of right female breast: Secondary | ICD-10-CM | POA: Diagnosis present

## 2022-03-03 DIAGNOSIS — Z171 Estrogen receptor negative status [ER-]: Secondary | ICD-10-CM | POA: Diagnosis not present

## 2022-03-03 DIAGNOSIS — Z7951 Long term (current) use of inhaled steroids: Secondary | ICD-10-CM | POA: Insufficient documentation

## 2022-03-03 DIAGNOSIS — J449 Chronic obstructive pulmonary disease, unspecified: Secondary | ICD-10-CM | POA: Diagnosis not present

## 2022-03-03 DIAGNOSIS — Z9221 Personal history of antineoplastic chemotherapy: Secondary | ICD-10-CM | POA: Diagnosis not present

## 2022-03-03 DIAGNOSIS — F0390 Unspecified dementia without behavioral disturbance: Secondary | ICD-10-CM | POA: Insufficient documentation

## 2022-03-03 DIAGNOSIS — R911 Solitary pulmonary nodule: Secondary | ICD-10-CM | POA: Insufficient documentation

## 2022-03-03 DIAGNOSIS — Z801 Family history of malignant neoplasm of trachea, bronchus and lung: Secondary | ICD-10-CM | POA: Diagnosis not present

## 2022-03-03 NOTE — Consult Note (Signed)
NEW PATIENT EVALUATION  Name: Nancy Berg  MRN: [id number]  Date:   03/03/2022     DOB: September 10, 1945   This 76 y.o. female patient presents to the clinic for initial evaluation of stage IIIb triple negative asymmetric carcinoma of the right breast (T2N2A M0) apocrine carcinoma status post wide local excision axillary dissection as well as adjuvant chemotherapy.  REFERRING PHYSICIAN: Verl Blalock, NP  CHIEF COMPLAINT:  Chief Complaint  Patient presents with   Breast Cancer    DIAGNOSIS: The encounter diagnosis was Malignant neoplasm of upper-outer quadrant of right breast in female, estrogen receptor negative ([place]).   PREVIOUS INVESTIGATIONS:  Mammogram and ultrasound reviewed.  PET scan CT scans reviewed Pathology reports reviewed Clinical notes reviewed  HPI: Patient is a 76 year old female who was noted on a CT scan work-up for shortness of breath was found to have right axillary adenopathy as well as a soft tissue lesion in the superior aspect of the right breast.  Mammogram confirmed an irregular mass within the 12 o'clock position 2.3 cm suspicious for malignancy.  There was also suspicious right axillary lymph nodes.  Both areas were biopsied and positive with invasive mammary carcinoma grade 2 triple negative apocrine carcinoma.  PET scan demonstrated the spiculated nodule with central cavitation left upper lobe as well as a partially calcified right breast lesion with right axillary lymph nodes hypermetabolic.  Patient underwent 1 cycle of Taxol awaiting surgery then underwent right modified mastectomy and axillary dissection.  Tumor is overall grade 3 apocrine adenocarcinoma measuring 1.8 cm.  There was lymphatic invasion present.  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  Margins were clear at 3 mm for DCIS and 2 mm for invasive component.  12 lymph nodes were examined for lymph nodes contained macro metastatic disease with extracapsular  extension.  She has been started on adjuvant carbo Taxol for 12 cycles has about a 1 month 1 month left of her chemotherapy.  She is now referred to ration collagen for consideration of treatment.  PLANNED TREATMENT REGIMEN: Right chest wall and peripheral lymphatics  PAST MEDICAL HISTORY:  has a past medical history of Arthritis, Cancer ([place]), CHF (congestive heart failure) ([place]), COPD (chronic obstructive pulmonary disease) ([place]), Dementia ([place]), and Hyperlipidemia.    PAST SURGICAL HISTORY:  Past Surgical History:  Procedure Laterality Date   BREAST BIOPSY Right 10/14/2021   rt 12:00 [place] bx venus clip path pending   BREAST BIOPSY Right 10/14/2021   rt axilla hydromarker path pending   EYE SURGERY Bilateral 2019   cataract   MASTECTOMY MODIFIED RADICAL Right 12/28/2021   Procedure: MASTECTOMY MODIFIED RADICAL;  Surgeon: Herbert Pun, MD;  Location: [facility] ORS;  Service: General;  Laterality: Right;   PORTACATH PLACEMENT N/A 11/25/2021   Procedure: INSERTION PORT-A-CATH;  Surgeon: Herbert Pun, MD;  Location: [facility] ORS;  Service: General;  Laterality: N/A;    FAMILY HISTORY: family history includes Breast cancer in her mother; Lung cancer in her sister.  SOCIAL HISTORY:  reports that she quit smoking about 2 years ago. Her smoking use included cigarettes. She has been exposed to tobacco smoke. She has never used smokeless tobacco. She reports that she does not currently use alcohol. She reports that she does not currently use drugs.  ALLERGIES: Penicillins  MEDICATIONS:  Current Outpatient Medications  Medication Sig Dispense Refill   acetaminophen (TYLENOL) 325 MG tablet Take 650 mg by mouth See admin instructions. Take 2 tablets (650 mg) by mouth 3 times daily  scheduled (0800, 1400 & 2000) & take 2 tablets (650 mg) by mouth up to twice daily as needed for pain.     albuterol (VENTOLIN HFA) 108 (90 Base) MCG/ACT inhaler Inhale 2 puffs into the lungs every 6 (six) hours as  needed for shortness of breath or wheezing.     Albuterol Sulfate 2.5 MG/0.5ML NEBU Inhale into the lungs as needed.     alendronate (FOSAMAX) 70 MG tablet Take 70 mg by mouth every Monday.     ammonium lactate (LAC-HYDRIN) 12 % lotion Apply 1 application. topically daily. (0800) Lower legs.     atorvastatin (LIPITOR) 20 MG tablet Take 20 mg by mouth daily. (0800)     budesonide (PULMICORT) 0.5 MG/2ML nebulizer solution Inhale 0.5 mg into the lungs daily. (0800)     calcium carbonate (TUMS EX) 750 MG chewable tablet Chew 1 tablet by mouth in the morning and at bedtime. (0800 & 2000)     Cholecalciferol (VITAMIN D3) 50 MCG (2000 UT) TABS Take 2,000 Units by mouth daily. (0800)     guaiFENesin (MUCINEX) 600 MG 12 hr tablet Take 600 mg by mouth 2 (two) times daily as needed (congestion.).     INCRUSE ELLIPTA 62.5 MCG/ACT AEPB Inhale 1 puff into the lungs daily. (0800)     lidocaine-prilocaine (EMLA) cream Apply to affected area once 30 g 3   LUMIGAN 0.01 % SOLN Place 1 drop into both eyes at bedtime. (2000)     metoprolol succinate (TOPROL-XL) 25 MG 24 hr tablet Take 25 mg by mouth daily. (0800) Hold if SBP<100 or Pulse <60.     Multiple Vitamins-Minerals (HEALTHY EYES SUPERVISION 2 PO) Take 1 capsule by mouth in the morning and at bedtime. (0730 & 1730)     ondansetron (ZOFRAN) 8 MG tablet Take 1 tablet (8 mg total) by mouth 2 (two) times daily as needed (Nausea or vomiting). 30 tablet 1   OXYGEN Inhale 2 L/min into the lungs as needed (shortness of breath OR if pulse ox is <90% on room air).     potassium chloride SA (KLOR-CON M) 20 MEQ tablet Take 20 mEq by mouth daily. (0800)     prochlorperazine (COMPAZINE) 10 MG tablet Take 1 tablet (10 mg total) by mouth every 6 (six) hours as needed (Nausea or vomiting). 30 tablet 1   traMADol (ULTRAM) 50 MG tablet Take 1 tablet (50 mg total) by mouth every 6 (six) hours as needed. 20 tablet 0   No current facility-administered medications for this  encounter.    ECOG PERFORMANCE STATUS:  0 - Asymptomatic  REVIEW OF SYSTEMS: Patient denies any weight loss, fatigue, weakness, fever, chills or night sweats. Patient denies any loss of vision, blurred vision. Patient denies any ringing  of the ears or hearing loss. No irregular heartbeat. Patient denies heart murmur or history of fainting. Patient denies any chest pain or pain radiating to her upper extremities. Patient denies any shortness of breath, difficulty breathing at night, cough or hemoptysis. Patient denies any swelling in the lower legs. Patient denies any nausea vomiting, vomiting of blood, or coffee ground material in the vomitus. Patient denies any stomach pain. Patient states has had normal bowel movements no significant constipation or diarrhea. Patient denies any dysuria, hematuria or significant nocturia. Patient denies any problems walking, swelling in the joints or loss of balance. Patient denies any skin changes, loss of hair or loss of weight. Patient denies any excessive worrying or anxiety or significant depression. Patient denies  any problems with insomnia. Patient denies excessive thirst, polyuria, polydipsia. Patient denies any swollen glands, patient denies easy bruising or easy bleeding. Patient denies any recent infections, allergies or URI. Patient "s visual fields have not changed significantly in recent time.   PHYSICAL EXAM: Frail-appearing wheelchair-bound female in NAD BP 107/77 (BP Location: Left Wrist, Patient Position: Sitting, Cuff Size: Small)   Pulse 92   Temp 98.4 F (36.9 C) (Tympanic)   Resp 16  Patient is status post right modified radical mastectomy scars well-healed no evidence of chest wall mass or nodularity is noted.  No axillary or supraclavicular adenopathy is appreciated no swelling in her right upper extremity is noted.  Well-developed well-nourished patient in NAD. HEENT reveals PERLA, EOMI, discs not visualized.  Oral cavity is clear. No oral  mucosal lesions are identified. Neck is clear without evidence of cervical or supraclavicular adenopathy. Lungs are clear to A&P. Cardiac examination is essentially unremarkable with regular rate and rhythm without murmur rub or thrill. Abdomen is benign with no organomegaly or masses noted. Motor sensory and DTR levels are equal and symmetric in the upper and lower extremities. Cranial nerves II through XII are grossly intact. Proprioception is intact. No peripheral adenopathy or edema is identified. No motor or sensory levels are noted. Crude visual fields are within normal range.  LABORATORY DATA: Pathology reports reviewed    RADIOLOGY RESULTS: PET scan CT scans mammograms and ultrasound all reviewed compatible with above-stated findings   IMPRESSION: Stage IIIb apocrine carcinoma the right breast status post modified radical mastectomy as well as adjuvant chemotherapy in 76 year old female  PLAN: This time I would recommend a right chest wall and peripheral lymphatic radiation based on the poor prognostic factors including marked axillary involvement with extracapsular extension.  We will plan on delivering 5040 cGy in 28 fractions boosting her scar another 1000 cGy.  Risks and benefits of treatment including skin reaction fatigue alteration of blood counts possible inclusion of superficial lung slight chance of lymphedema of her right upper extremity all were explained to the patient and her son.  They both seem to comprehend my treatment plan well.  I have gone out 6 weeks to give her a simulation appointment to allow her completion of her chemotherapy and some rest.  They know to call with any concerns.  I would like to take this opportunity to thank you for allowing me to participate in the care of your patient.Noreene Filbert, MD

## 2022-03-04 ENCOUNTER — Other Ambulatory Visit: Payer: Self-pay

## 2022-03-04 MED FILL — Dexamethasone Sodium Phosphate Inj 100 MG/10ML: INTRAMUSCULAR | Qty: 1 | Status: AC

## 2022-03-06 ENCOUNTER — Encounter: Payer: Self-pay | Admitting: Oncology

## 2022-03-07 ENCOUNTER — Inpatient Hospital Stay (HOSPITAL_BASED_OUTPATIENT_CLINIC_OR_DEPARTMENT_OTHER): Payer: Medicare HMO | Admitting: Medical Oncology

## 2022-03-07 ENCOUNTER — Inpatient Hospital Stay: Payer: Medicare HMO

## 2022-03-07 ENCOUNTER — Encounter: Payer: Self-pay | Admitting: Medical Oncology

## 2022-03-07 ENCOUNTER — Inpatient Hospital Stay: Payer: Medicare HMO | Attending: Oncology

## 2022-03-07 VITALS — BP 123/63 | HR 90 | Temp 98.2°F | Resp 16 | Wt 95.6 lb

## 2022-03-07 DIAGNOSIS — Z5111 Encounter for antineoplastic chemotherapy: Secondary | ICD-10-CM

## 2022-03-07 DIAGNOSIS — Z9013 Acquired absence of bilateral breasts and nipples: Secondary | ICD-10-CM | POA: Insufficient documentation

## 2022-03-07 DIAGNOSIS — Z95828 Presence of other vascular implants and grafts: Secondary | ICD-10-CM

## 2022-03-07 DIAGNOSIS — C50411 Malignant neoplasm of upper-outer quadrant of right female breast: Secondary | ICD-10-CM | POA: Insufficient documentation

## 2022-03-07 DIAGNOSIS — R634 Abnormal weight loss: Secondary | ICD-10-CM | POA: Diagnosis not present

## 2022-03-07 DIAGNOSIS — Z171 Estrogen receptor negative status [ER-]: Secondary | ICD-10-CM | POA: Diagnosis not present

## 2022-03-07 DIAGNOSIS — C773 Secondary and unspecified malignant neoplasm of axilla and upper limb lymph nodes: Secondary | ICD-10-CM | POA: Insufficient documentation

## 2022-03-07 DIAGNOSIS — Z5112 Encounter for antineoplastic immunotherapy: Secondary | ICD-10-CM | POA: Diagnosis not present

## 2022-03-07 LAB — COMPREHENSIVE METABOLIC PANEL
ALT: 13 U/L (ref 0–44)
AST: 21 U/L (ref 15–41)
Albumin: 3.5 g/dL (ref 3.5–5.0)
Alkaline Phosphatase: 64 U/L (ref 38–126)
Anion gap: 7 (ref 5–15)
BUN: 21 mg/dL (ref 8–23)
CO2: 31 mmol/L (ref 22–32)
Calcium: 9.3 mg/dL (ref 8.9–10.3)
Chloride: 99 mmol/L (ref 98–111)
Creatinine, Ser: 0.5 mg/dL (ref 0.44–1.00)
GFR, Estimated: >60 mL/min (ref >60)
Glucose, Bld: 114 mg/dL — ABNORMAL HIGH (ref 70–99)
Potassium: 4.2 mmol/L (ref 3.5–5.1)
Sodium: 137 mmol/L (ref 135–145)
Total Bilirubin: 0.4 mg/dL (ref 0.3–1.2)
Total Protein: 6.5 g/dL (ref 6.5–8.1)

## 2022-03-07 LAB — CBC WITH DIFFERENTIAL/PLATELET
Abs Immature Granulocytes: 0.06 10*3/uL (ref 0.00–0.07)
Basophils Absolute: 0 10*3/uL (ref 0.0–0.1)
Basophils Relative: 1 %
Eosinophils Absolute: 0.1 10*3/uL (ref 0.0–0.5)
Eosinophils Relative: 1 %
HCT: 36.9 % (ref 36.0–46.0)
Hemoglobin: 12.1 g/dL (ref 12.0–15.0)
Immature Granulocytes: 1 %
Lymphocytes Relative: 22 %
Lymphs Abs: 1.3 10*3/uL (ref 0.7–4.0)
MCH: 31.8 pg (ref 26.0–34.0)
MCHC: 32.8 g/dL (ref 30.0–36.0)
MCV: 97.1 fL (ref 80.0–100.0)
Monocytes Absolute: 0.7 10*3/uL (ref 0.1–1.0)
Monocytes Relative: 11 %
Neutro Abs: 3.9 10*3/uL (ref 1.7–7.7)
Neutrophils Relative %: 64 %
Platelets: 208 10*3/uL (ref 150–400)
RBC: 3.8 MIL/uL — ABNORMAL LOW (ref 3.87–5.11)
RDW: 18 % — ABNORMAL HIGH (ref 11.5–15.5)
WBC: 6 10*3/uL (ref 4.0–10.5)
nRBC: 0 % (ref 0.0–0.2)

## 2022-03-07 MED ORDER — DIPHENHYDRAMINE HCL 50 MG/ML IJ SOLN
50.0000 mg | Freq: Once | INTRAMUSCULAR | Status: AC
  Administered 2022-03-07: 50 mg via INTRAVENOUS
  Filled 2022-03-07: qty 1

## 2022-03-07 MED ORDER — SODIUM CHLORIDE 0.9 % IV SOLN
65.0000 mg/m2 | Freq: Once | INTRAVENOUS | Status: AC
  Administered 2022-03-07: 90 mg via INTRAVENOUS
  Filled 2022-03-07: qty 15

## 2022-03-07 MED ORDER — FAMOTIDINE IN NACL 20-0.9 MG/50ML-% IV SOLN
20.0000 mg | Freq: Once | INTRAVENOUS | Status: AC
  Administered 2022-03-07: 20 mg via INTRAVENOUS
  Filled 2022-03-07: qty 50

## 2022-03-07 MED ORDER — SODIUM CHLORIDE 0.9 % IV SOLN
10.0000 mg | Freq: Once | INTRAVENOUS | Status: AC
  Administered 2022-03-07: 10 mg via INTRAVENOUS
  Filled 2022-03-07: qty 10

## 2022-03-07 MED ORDER — HEPARIN SOD (PORK) LOCK FLUSH 100 UNIT/ML IV SOLN
INTRAVENOUS | Status: AC
  Administered 2022-03-07: 500 [IU]
  Filled 2022-03-07: qty 5

## 2022-03-07 MED ORDER — SODIUM CHLORIDE 0.9 % IV SOLN
85.3500 mg | Freq: Once | INTRAVENOUS | Status: AC
  Administered 2022-03-07: 90 mg via INTRAVENOUS
  Filled 2022-03-07: qty 9

## 2022-03-07 MED ORDER — HEPARIN SOD (PORK) LOCK FLUSH 100 UNIT/ML IV SOLN
500.0000 [IU] | Freq: Once | INTRAVENOUS | Status: AC | PRN
  Filled 2022-03-07: qty 5

## 2022-03-07 MED ORDER — SODIUM CHLORIDE 0.9 % IV SOLN
Freq: Once | INTRAVENOUS | Status: AC
  Filled 2022-03-07: qty 250

## 2022-03-07 NOTE — Patient Instructions (Signed)
[facility]  Discharge Instructions: Thank you for choosing [facility] to provide your oncology and hematology care.  If you have a lab appointment with the Cancer Center, please go directly to the Cancer Center and check in at the registration area.  Wear comfortable clothing and clothing appropriate for easy access to any Portacath or PICC line.   We strive to give you quality time with your provider. You may need to reschedule your appointment if you arrive late (15 or more minutes).  Arriving late affects you and other patients whose appointments are after yours.  Also, if you miss three or more appointments without notifying the office, you may be dismissed from the clinic at the provider's discretion.      For prescription refill requests, have your pharmacy contact our office and allow 72 hours for refills to be completed.    Today you received the following chemotherapy and/or immunotherapy agents Taxol and Carboplatin       To help prevent nausea and vomiting after your treatment, we encourage you to take your nausea medication as directed.  BELOW ARE SYMPTOMS THAT SHOULD BE REPORTED IMMEDIATELY: *FEVER GREATER THAN 100.4 F (38 C) OR HIGHER *CHILLS OR SWEATING *NAUSEA AND VOMITING THAT IS NOT CONTROLLED WITH YOUR NAUSEA MEDICATION *UNUSUAL SHORTNESS OF BREATH *UNUSUAL BRUISING OR BLEEDING *URINARY PROBLEMS (pain or burning when urinating, or frequent urination) *BOWEL PROBLEMS (unusual diarrhea, constipation, pain near the anus) TENDERNESS IN MOUTH AND THROAT WITH OR WITHOUT PRESENCE OF ULCERS (sore throat, sores in mouth, or a toothache) UNUSUAL RASH, SWELLING OR PAIN  UNUSUAL VAGINAL DISCHARGE OR ITCHING   Items with * indicate a potential emergency and should be followed up as soon as possible or go to the Emergency Department if any problems should occur.  Please show the CHEMOTHERAPY ALERT CARD or IMMUNOTHERAPY ALERT CARD at  check-in to the Emergency Department and triage nurse.  Should you have questions after your visit or need to cancel or reschedule your appointment, please contact [facility]  [phone] and follow the prompts.  Office hours are 8:00 a.m. to 4:30 p.m. Monday - Friday. Please note that voicemails left after 4:00 p.m. may not be returned until the following business day.  We are closed weekends and major holidays. You have access to a nurse at all times for urgent questions. Please call the main number to the clinic [phone] and follow the prompts.  For any non-urgent questions, you may also contact your provider using MyChart. We now offer e-Visits for anyone 18 and older to request care online for non-urgent symptoms. For details visit [email or web address].   Also download the MyChart app! Go to the app store, search "MyChart", open the app, select [facility], and log in with your MyChart username and password.  Masks are optional in the cancer centers. If you would like for your care team to wear a mask while they are taking care of you, please let them know. For doctor visits, patients may have with them one support person who is at least 76 years old. At this time, visitors are not allowed in the infusion area.  

## 2022-03-07 NOTE — Progress Notes (Signed)
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  That incidentally showed right axillary adenopathy and soft tissue in the superior right breast.  This was followed by diagnostic right breast mammogram and ultrasound which showed a 2.3 x 1.6 x 1.2 cm irregular hypoechoic mass in the right breast at the 12 o'clock position 1 cm from the nipple.  Enlarged right axillary lymph node 1.8 cm.  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  Patient has oxygen dependent COPD.     PET CT scan shows FDG avid spiculated nodule with central cavitation in the left upper lobe.  Size an SUV of this nodule was not quantified on the PET scan.  Partially calcified markedly avid right breast lesion with markedly avid right axillary lymph node metastases.  Patient underwent 1 cycle of Taxol chemotherapy while awaiting surgery and underwent final surgery on 12/28/2021.  Final pathology showed18 mm grade 3 invasive mammary carcinoma triple negative. Macrometastases and at least for an micrometastases in 0 lymph nodes.  Largest size of metastatic deposit 30 mm.  Extranodal extension present.  pT1C pN2A   Given age and frailty plan is  to proceed with CarboTaxol chemotherapy alone for 12 cycles    Interval history- Presents with her son. She reports that she is doing well. Alopecia is the only side effect from her chemotherapy. Gaining weight due to increased appetite. No fevers, SOB, N/V/D/C.   ECOG PS- 2 Pain scale- 0   Review of systems- Review of Systems  Constitutional:  Negative for chills, fever, malaise/fatigue and weight loss.  HENT:  Negative for congestion, ear discharge and nosebleeds.   Eyes:  Negative for blurred vision.  Respiratory:  Negative for cough, hemoptysis, sputum production, shortness of breath and wheezing.   Cardiovascular:  Negative for chest pain, palpitations, orthopnea and claudication.  Gastrointestinal:  Negative for abdominal pain, blood in stool, constipation, diarrhea, heartburn, melena, nausea and vomiting.  Genitourinary:  Negative for dysuria, flank pain, frequency, hematuria and urgency.  Musculoskeletal:  Negative for back pain, joint pain and myalgias.  Skin:  Negative for rash.  Neurological:  Negative for dizziness, tingling, focal weakness, seizures, weakness and headaches.  Endo/Heme/Allergies:  Does not bruise/bleed easily.  Psychiatric/Behavioral:  Negative for depression and suicidal ideas. The patient does not have insomnia.       Allergies  Allergen Reactions   Penicillins Rash and Other (See Comments)     Past Medical History:  Diagnosis Date   Arthritis    Cancer ([place])    CHF (congestive heart failure) (HCC)    COPD (chronic obstructive pulmonary disease) (HCC)    Dementia ([place])    Hyperlipidemia      Past Surgical History:  Procedure Laterality Date   BREAST BIOPSY Right 10/14/2021   rt 12:00  [place] bx venus clip path pending   BREAST BIOPSY Right 10/14/2021   rt axilla hydromarker path pending   EYE SURGERY Bilateral 2019   cataract   MASTECTOMY MODIFIED RADICAL Right 12/28/2021   Procedure: MASTECTOMY MODIFIED RADICAL;  Surgeon: Herbert Pun, MD;  Location: [facility] ORS;  Service: General;  Laterality: Right;   PORTACATH PLACEMENT N/A 11/25/2021   Procedure: INSERTION PORT-A-CATH;  Surgeon: Herbert Pun, MD;  Location: [facility] ORS;  Service: General;  Laterality: N/A;    Social History   Socioeconomic History   Marital status: Divorced    Spouse name: Not on file   Number of children: Not on file   Years of education: Not on file   Highest education level: Not on file  Occupational History   Not on file  Tobacco Use   Smoking status: Former    Types: Cigarettes    Quit date: 10/2019    Years since quitting: 2.3    Passive exposure: Past   Smokeless tobacco: Never  Vaping Use   Vaping Use: Never used  Substance and Sexual Activity   Alcohol use: Not Currently   Drug use: Not Currently   Sexual activity: Not Currently  Other Topics Concern   Not on file  Social History Narrative   Not on file   Social Determinants of Health   Financial Resource Strain: Not on file  Food Insecurity: Not on file  Transportation Needs: Not on file  Physical Activity: Not on file  Stress: Not on file  Social Connections: Not on file  Intimate Partner Violence: Not on file    Family History  Problem Relation Age of Onset   Breast cancer Mother    Lung cancer Sister      Current Outpatient Medications:    acetaminophen (TYLENOL) 325 MG tablet, Take 650 mg by mouth See admin instructions. Take 2 tablets (650 mg) by mouth 3 times daily scheduled (0800, 1400 & 2000) & take 2 tablets (650 mg) by mouth up to twice daily as needed for pain., Disp: , Rfl:    albuterol (VENTOLIN HFA) 108 (90 Base) MCG/ACT inhaler, Inhale 2 puffs into the lungs every 6 (six) hours as needed for shortness of breath or wheezing., Disp: , Rfl:    Albuterol Sulfate 2.5 MG/0.5ML NEBU, Inhale into the lungs as needed., Disp: , Rfl:    alendronate (FOSAMAX) 70 MG tablet, Take 70 mg by mouth every Monday., Disp: , Rfl:    ammonium lactate  (LAC-HYDRIN) 12 % lotion, Apply 1 application. topically daily. (0800) Lower legs., Disp: , Rfl:    atorvastatin (LIPITOR) 20 MG tablet, Take 20 mg by mouth daily. (0800), Disp: , Rfl:    budesonide (PULMICORT) 0.5 MG/2ML nebulizer solution, Inhale 0.5 mg into the lungs daily. (0800), Disp: , Rfl:    calcium carbonate (TUMS EX) 750 MG chewable tablet, Chew 1 tablet by mouth in the morning and at bedtime. (0800 & 2000), Disp: , Rfl:    Cholecalciferol (VITAMIN D3) 50 MCG (2000 UT) TABS, Take 2,000 Units by mouth daily. (0800), Disp: , Rfl:    INCRUSE ELLIPTA 62.5 MCG/ACT AEPB, Inhale 1 puff into the lungs daily. (0800), Disp: , Rfl:    lidocaine-prilocaine (EMLA) cream, Apply to affected area once, Disp: 30 g, Rfl: 3   LUMIGAN 0.01 % SOLN, Place 1 drop into both eyes at bedtime. (2000), Disp: , Rfl:    metoprolol succinate (TOPROL-XL) 25 MG 24 hr tablet, Take 25 mg by mouth daily. (0800)  Hold if SBP<100 or Pulse <60., Disp: , Rfl:    Multiple Vitamins-Minerals (HEALTHY EYES SUPERVISION 2 PO), Take 1 capsule by mouth in the morning and at bedtime. (0730 & 1730), Disp: , Rfl:    OXYGEN, Inhale 2 L/min into the lungs as needed (shortness of breath OR if pulse ox is <90% on room air)., Disp: , Rfl:    potassium chloride SA (KLOR-CON M) 20 MEQ tablet, Take 20 mEq by mouth daily. (0800), Disp: , Rfl:    traMADol (ULTRAM) 50 MG tablet, Take 1 tablet (50 mg total) by mouth every 6 (six) hours as needed., Disp: 20 tablet, Rfl: 0   guaiFENesin (MUCINEX) 600 MG 12 hr tablet, Take 600 mg by mouth 2 (two) times daily as needed (congestion.). (Patient not taking: Reported on 03/07/2022), Disp: , Rfl:    ondansetron (ZOFRAN) 8 MG tablet, Take 1 tablet (8 mg total) by mouth 2 (two) times daily as needed (Nausea or vomiting). (Patient not taking: Reported on 03/07/2022), Disp: 30 tablet, Rfl: 1   prochlorperazine (COMPAZINE) 10 MG tablet, Take 1 tablet (10 mg total) by mouth every 6 (six) hours as needed (Nausea or  vomiting). (Patient not taking: Reported on 03/07/2022), Disp: 30 tablet, Rfl: 1  Physical exam:  Vitals:   03/07/22 0924  BP: 123/63  Pulse: 90  Resp: 16  Temp: 98.2 F (36.8 C)  SpO2: 92%  Weight: 95 lb 9.6 oz (43.4 kg)   Physical Exam Constitutional:      General: She is not in acute distress.    Comments: She is sitting in a wheelchair and on home oxygen  Cardiovascular:     Rate and Rhythm: Normal rate and regular rhythm.     Heart sounds: Normal heart sounds.  Pulmonary:     Effort: Pulmonary effort is normal.     Breath sounds: Normal breath sounds.  Skin:    General: Skin is warm and dry.  Neurological:     Mental Status: She is alert and oriented to person, place, and time.         Latest Ref Rng & Units 03/07/2022    9:03 AM  CMP  Glucose 70 - 99 mg/dL 114   BUN 8 - 23 mg/dL 21   Creatinine 0.44 - 1.00 mg/dL 0.50   Sodium 135 - 145 mmol/L 137   Potassium 3.5 - 5.1 mmol/L 4.2   Chloride 98 - 111 mmol/L 99   CO2 22 - 32 mmol/L 31   Calcium 8.9 - 10.3 mg/dL 9.3   Total Protein 6.5 - 8.1 g/dL 6.5   Total Bilirubin 0.3 - 1.2 mg/dL 0.4   Alkaline Phos 38 - 126 U/L 64   AST 15 - 41 U/L 21   ALT 0 - 44 U/L 13       Latest Ref Rng & Units 03/07/2022    9:03 AM  CBC  WBC 4.0 - 10.5 K/uL 6.0   Hemoglobin 12.0 - 15.0 g/dL 12.1   Hematocrit 36.0 - 46.0 % 36.9   Platelets 150 - 400 K/uL 208      Assessment and plan- Patient is a 76 y.o. female with clinical prognostic stage IIIb invasive apocrine  carcinoma of the right breast apocrine variety T2 N1 M0 triple negative.  She is here for on treatment assessment prior to cycle 8 of weekly CarboTaxol chemotherapy  Counts okay to proceed with cycle 9 of weekly CarboTaxol chemotherapy today.  She has 3 more weekly CarboTaxol chemotherapy is  remaining.  She will be seen by covering NP in 2 weeks and I will see her back in 2 weeks for cycle 12  Dr. Janese Banks has tried to get Beryle Flock approved- still working on this. Per her  last note "If they approve it I would like her to start [place] along with CarboTaxol chemotherapy and then continue every 3 weeks for up to 9 cycles."  She is planning on starting radiation after chemotherapy.    Visit Diagnosis 1. Malignant neoplasm of upper-outer quadrant of right breast in female, estrogen receptor negative ([place])   2. Encounter for antineoplastic immunotherapy   3. Encounter for antineoplastic chemotherapy   4. Port-A-Cath in place      [facility] 03/07/2022 9:51 AM

## 2022-03-08 ENCOUNTER — Other Ambulatory Visit: Payer: Self-pay | Admitting: Oncology

## 2022-03-08 ENCOUNTER — Other Ambulatory Visit: Payer: Self-pay | Admitting: *Deleted

## 2022-03-08 ENCOUNTER — Telehealth: Payer: Self-pay | Admitting: *Deleted

## 2022-03-08 DIAGNOSIS — C50411 Malignant neoplasm of upper-outer quadrant of right female breast: Secondary | ICD-10-CM

## 2022-03-08 NOTE — Telephone Encounter (Signed)
Anderson Malta called the son to let him know that pt's insurance approved the [place] and will start it on her next appt. He was good with this plan

## 2022-03-08 NOTE — Progress Notes (Signed)
Orders in 

## 2022-03-11 MED FILL — Dexamethasone Sodium Phosphate Inj 100 MG/10ML: INTRAMUSCULAR | Qty: 1 | Status: AC

## 2022-03-14 ENCOUNTER — Inpatient Hospital Stay: Payer: Medicare HMO

## 2022-03-14 ENCOUNTER — Inpatient Hospital Stay (HOSPITAL_BASED_OUTPATIENT_CLINIC_OR_DEPARTMENT_OTHER): Payer: Medicare HMO | Admitting: Nurse Practitioner

## 2022-03-14 VITALS — BP 120/79 | HR 84 | Temp 97.8°F | Resp 18 | Wt 90.6 lb

## 2022-03-14 DIAGNOSIS — Z5112 Encounter for antineoplastic immunotherapy: Secondary | ICD-10-CM

## 2022-03-14 DIAGNOSIS — Z171 Estrogen receptor negative status [ER-]: Secondary | ICD-10-CM

## 2022-03-14 DIAGNOSIS — C50411 Malignant neoplasm of upper-outer quadrant of right female breast: Secondary | ICD-10-CM

## 2022-03-14 DIAGNOSIS — Z5111 Encounter for antineoplastic chemotherapy: Secondary | ICD-10-CM | POA: Diagnosis not present

## 2022-03-14 LAB — COMPREHENSIVE METABOLIC PANEL
ALT: 14 U/L (ref 0–44)
AST: 22 U/L (ref 15–41)
Albumin: 3.6 g/dL (ref 3.5–5.0)
Alkaline Phosphatase: 64 U/L (ref 38–126)
Anion gap: 9 (ref 5–15)
BUN: 16 mg/dL (ref 8–23)
CO2: 27 mmol/L (ref 22–32)
Calcium: 9.5 mg/dL (ref 8.9–10.3)
Chloride: 97 mmol/L — ABNORMAL LOW (ref 98–111)
Creatinine, Ser: 0.44 mg/dL (ref 0.44–1.00)
GFR, Estimated: >60 mL/min (ref >60)
Glucose, Bld: 98 mg/dL (ref 70–99)
Potassium: 4.3 mmol/L (ref 3.5–5.1)
Sodium: 133 mmol/L — ABNORMAL LOW (ref 135–145)
Total Bilirubin: 0.5 mg/dL (ref 0.3–1.2)
Total Protein: 6.7 g/dL (ref 6.5–8.1)

## 2022-03-14 LAB — CBC WITH DIFFERENTIAL/PLATELET
Abs Immature Granulocytes: 0.07 10*3/uL (ref 0.00–0.07)
Basophils Absolute: 0.1 10*3/uL (ref 0.0–0.1)
Basophils Relative: 1 %
Eosinophils Absolute: 0.1 10*3/uL (ref 0.0–0.5)
Eosinophils Relative: 1 %
HCT: 37.5 % (ref 36.0–46.0)
Hemoglobin: 12.4 g/dL (ref 12.0–15.0)
Immature Granulocytes: 1 %
Lymphocytes Relative: 21 %
Lymphs Abs: 1.3 10*3/uL (ref 0.7–4.0)
MCH: 32.5 pg (ref 26.0–34.0)
MCHC: 33.1 g/dL (ref 30.0–36.0)
MCV: 98.2 fL (ref 80.0–100.0)
Monocytes Absolute: 0.7 10*3/uL (ref 0.1–1.0)
Monocytes Relative: 11 %
Neutro Abs: 4.1 10*3/uL (ref 1.7–7.7)
Neutrophils Relative %: 65 %
Platelets: 232 10*3/uL (ref 150–400)
RBC: 3.82 MIL/uL — ABNORMAL LOW (ref 3.87–5.11)
RDW: 18.5 % — ABNORMAL HIGH (ref 11.5–15.5)
WBC: 6.2 10*3/uL (ref 4.0–10.5)
nRBC: 0 % (ref 0.0–0.2)

## 2022-03-14 LAB — TSH: TSH: 1.739 u[IU]/mL (ref 0.350–4.500)

## 2022-03-14 LAB — T4, FREE: Free T4: 0.72 ng/dL (ref 0.61–1.12)

## 2022-03-14 MED ORDER — DIPHENHYDRAMINE HCL 50 MG/ML IJ SOLN
50.0000 mg | Freq: Once | INTRAMUSCULAR | Status: AC
  Administered 2022-03-14: 50 mg via INTRAVENOUS

## 2022-03-14 MED ORDER — SODIUM CHLORIDE 0.9 % IV SOLN
Freq: Once | INTRAVENOUS | Status: AC
  Filled 2022-03-14: qty 250

## 2022-03-14 MED ORDER — FAMOTIDINE IN NACL 20-0.9 MG/50ML-% IV SOLN
INTRAVENOUS | Status: AC
  Filled 2022-03-14: qty 50

## 2022-03-14 MED ORDER — SODIUM CHLORIDE 0.9 % IV SOLN
85.3500 mg | Freq: Once | INTRAVENOUS | Status: AC
  Administered 2022-03-14: 90 mg via INTRAVENOUS
  Filled 2022-03-14: qty 9

## 2022-03-14 MED ORDER — DIPHENHYDRAMINE HCL 50 MG/ML IJ SOLN
INTRAMUSCULAR | Status: AC
  Filled 2022-03-14: qty 1

## 2022-03-14 MED ORDER — FAMOTIDINE IN NACL 20-0.9 MG/50ML-% IV SOLN
20.0000 mg | Freq: Once | INTRAVENOUS | Status: AC
  Administered 2022-03-14: 20 mg via INTRAVENOUS

## 2022-03-14 MED ORDER — SODIUM CHLORIDE 0.9 % IV SOLN
65.0000 mg/m2 | Freq: Once | INTRAVENOUS | Status: AC
  Administered 2022-03-14: 90 mg via INTRAVENOUS
  Filled 2022-03-14: qty 15

## 2022-03-14 MED ORDER — SODIUM CHLORIDE 0.9 % IV SOLN
200.0000 mg | Freq: Once | INTRAVENOUS | Status: AC
  Administered 2022-03-14: 200 mg via INTRAVENOUS
  Filled 2022-03-14: qty 8

## 2022-03-14 MED ORDER — SODIUM CHLORIDE 0.9 % IV SOLN
10.0000 mg | Freq: Once | INTRAVENOUS | Status: AC
  Administered 2022-03-14: 10 mg via INTRAVENOUS
  Filled 2022-03-14: qty 10

## 2022-03-14 MED ORDER — HEPARIN SOD (PORK) LOCK FLUSH 100 UNIT/ML IV SOLN
500.0000 [IU] | Freq: Once | INTRAVENOUS | Status: AC | PRN
  Administered 2022-03-14: 500 [IU]
  Filled 2022-03-14: qty 5

## 2022-03-14 NOTE — Progress Notes (Signed)
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  That incidentally showed right axillary adenopathy and soft tissue in the superior right breast.  This was followed by diagnostic right breast mammogram and ultrasound which showed a 2.3 x 1.6 x 1.2 cm irregular hypoechoic mass in the right breast at the 12 o'clock position 1 cm from the nipple.  Enlarged right axillary lymph node 1.8 cm.  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  Patient has oxygen dependent COPD.     PET CT scan shows FDG avid spiculated nodule with central cavitation in the left upper lobe.  Size an SUV of this nodule was not quantified on the PET scan.  Partially calcified markedly avid right breast lesion with markedly avid right axillary lymph node metastases.  Patient underwent 1 cycle of Taxol chemotherapy while awaiting surgery and underwent final surgery on 12/28/2021.  Final pathology showed18 mm grade 3 invasive mammary carcinoma triple negative. Macrometastases and at least for an micrometastases in 0 lymph nodes.  Largest size of metastatic deposit 30 mm.  Extranodal extension present.  pT1C pN2A   Given  age and frailty plan is to proceed with CarboTaxol chemotherapy alone for 12 cycles.     Interval history-patient is 76 year old female with above history of triple negative breast cancer currently undergoing weekly CarboTaxol chemotherapy who returns to clinic for consideration of cycle 11 of CarboTaxol and initiation of immunotherapy.  Appetite is stable and she has had slight weight loss.  Alopecia is stable.  No other side effects.  Says she is tolerating treatment well.   ECOG PS- 2 Pain scale- 0  Review of systems- Review of Systems  Constitutional:  Negative for chills, fever, malaise/fatigue and weight loss.  HENT:  Negative for congestion, ear discharge and nosebleeds.   Eyes:  Negative for blurred vision.  Respiratory:  Negative for cough, hemoptysis, sputum production, shortness of breath and wheezing.   Cardiovascular:  Negative for chest pain, palpitations, orthopnea and claudication.  Gastrointestinal:  Negative for abdominal pain, blood in stool, constipation, diarrhea, heartburn, melena, nausea and vomiting.  Genitourinary:  Negative for dysuria, flank pain, frequency, hematuria and urgency.  Musculoskeletal:  Negative for back pain, joint pain and myalgias.  Skin:  Negative for rash.  Neurological:  Negative for dizziness, tingling, focal weakness, seizures, weakness and headaches.  Endo/Heme/Allergies:  Does not bruise/bleed easily.  Psychiatric/Behavioral:  Negative for depression and suicidal ideas. The patient does not have insomnia.      Allergies  Allergen Reactions   Penicillins Rash and Other (See Comments)    Past Medical History:  Diagnosis Date   Arthritis    Cancer ([place])    CHF (congestive heart failure) (H[facility])    COPD (chronic obstructive pulmonary disease) ([place])  Dementia ([place])    Hyperlipidemia    Past Surgical History:  Procedure Laterality Date   BREAST BIOPSY Right 10/14/2021   rt 12:00 [place] bx venus clip path pending   BREAST BIOPSY Right  10/14/2021   rt axilla hydromarker path pending   EYE SURGERY Bilateral 2019   cataract   MASTECTOMY MODIFIED RADICAL Right 12/28/2021   Procedure: MASTECTOMY MODIFIED RADICAL;  Surgeon: Herbert Pun, MD;  Location: [facility] ORS;  Service: General;  Laterality: Right;   PORTACATH PLACEMENT N/A 11/25/2021   Procedure: INSERTION PORT-A-CATH;  Surgeon: Herbert Pun, MD;  Location: [facility] ORS;  Service: General;  Laterality: N/A;    Social History   Socioeconomic History   Marital status: Divorced    Spouse name: Not on file   Number of children: Not on file   Years of education: Not on file   Highest education level: Not on file  Occupational History   Not on file  Tobacco Use   Smoking status: Former    Types: Cigarettes    Quit date: 10/2019    Years since quitting: 2.3    Passive exposure: Past   Smokeless tobacco: Never  Vaping Use   Vaping Use: Never used  Substance and Sexual Activity   Alcohol use: Not Currently   Drug use: Not Currently   Sexual activity: Not Currently  Other Topics Concern   Not on file  Social History Narrative   Not on file   Social Determinants of Health   Financial Resource Strain: Not on file  Food Insecurity: Not on file  Transportation Needs: Not on file  Physical Activity: Not on file  Stress: Not on file  Social Connections: Not on file  Intimate Partner Violence: Not on file    Family History  Problem Relation Age of Onset   Breast cancer Mother    Lung cancer Sister      Current Outpatient Medications:    acetaminophen (TYLENOL) 325 MG tablet, Take 650 mg by mouth See admin instructions. Take 2 tablets (650 mg) by mouth 3 times daily scheduled (0800, 1400 & 2000) & take 2 tablets (650 mg) by mouth up to twice daily as needed for pain., Disp: , Rfl:    albuterol (VENTOLIN HFA) 108 (90 Base) MCG/ACT inhaler, Inhale 2 puffs into the lungs every 6 (six) hours as needed for shortness of breath or wheezing., Disp: , Rfl:     Albuterol Sulfate 2.5 MG/0.5ML NEBU, Inhale into the lungs as needed., Disp: , Rfl:    alendronate (FOSAMAX) 70 MG tablet, Take 70 mg by mouth every Monday., Disp: , Rfl:    ammonium lactate (LAC-HYDRIN) 12 % lotion, Apply 1 application. topically daily. (0800) Lower legs., Disp: , Rfl:    atorvastatin (LIPITOR) 20 MG tablet, Take 20 mg by mouth daily. (0800), Disp: , Rfl:    budesonide (PULMICORT) 0.5 MG/2ML nebulizer solution, Inhale 0.5 mg into the lungs daily. (0800), Disp: , Rfl:    calcium carbonate (TUMS EX) 750 MG chewable tablet, Chew 1 tablet by mouth in the morning and at bedtime. (0800 & 2000), Disp: , Rfl:    Cholecalciferol (VITAMIN D3) 50 MCG (2000 UT) TABS, Take 2,000 Units by mouth daily. (0800), Disp: , Rfl:    guaiFENesin (MUCINEX) 600 MG 12 hr tablet, Take 600 mg by mouth 2 (two) times daily as needed (congestion.). (Patient not taking: Reported on 03/07/2022), Disp: , Rfl:    INCRUSE ELLIPTA 62.5 MCG/ACT AEPB, Inhale 1 puff into the lungs  daily. (0800), Disp: , Rfl:    lidocaine-prilocaine (EMLA) cream, Apply to affected area once, Disp: 30 g, Rfl: 3   LUMIGAN 0.01 % SOLN, Place 1 drop into both eyes at bedtime. (2000), Disp: , Rfl:    metoprolol succinate (TOPROL-XL) 25 MG 24 hr tablet, Take 25 mg by mouth daily. (0800) Hold if SBP<100 or Pulse <60., Disp: , Rfl:    Multiple Vitamins-Minerals (HEALTHY EYES SUPERVISION 2 PO), Take 1 capsule by mouth in the morning and at bedtime. (0730 & 1730), Disp: , Rfl:    ondansetron (ZOFRAN) 8 MG tablet, Take 1 tablet (8 mg total) by mouth 2 (two) times daily as needed (Nausea or vomiting). (Patient not taking: Reported on 03/07/2022), Disp: 30 tablet, Rfl: 1   OXYGEN, Inhale 2 L/min into the lungs as needed (shortness of breath OR if pulse ox is <90% on room air)., Disp: , Rfl:    potassium chloride SA (KLOR-CON M) 20 MEQ tablet, Take 20 mEq by mouth daily. (0800), Disp: , Rfl:    prochlorperazine (COMPAZINE) 10 MG tablet, Take 1 tablet  (10 mg total) by mouth every 6 (six) hours as needed (Nausea or vomiting). (Patient not taking: Reported on 03/07/2022), Disp: 30 tablet, Rfl: 1   traMADol (ULTRAM) 50 MG tablet, Take 1 tablet (50 mg total) by mouth every 6 (six) hours as needed., Disp: 20 tablet, Rfl: 0 No current facility-administered medications for this visit.  Facility-Administered Medications Ordered in Other Visits:    CARBOplatin (PARAPLATIN) 90 mg in sodium chloride 0.9 % 100 mL chemo infusion, 90 mg, Intravenous, Once, Sindy Guadeloupe, MD   diphenhydrAMINE (BENADRYL) 50 MG/ML injection, , , ,    famotidine (PEPCID) 20-0.9 MG/50ML-% IVPB, , , ,    PACLitaxel (TAXOL) 90 mg in sodium chloride 0.9 % 250 mL chemo infusion (</= '80mg'$ /m2), 65 mg/m2 (Treatment Plan Recorded), Intravenous, Once, Sindy Guadeloupe, MD, Last Rate: 265 mL/hr at 03/14/22 1314, 90 mg at 03/14/22 1314  Physical exam:  Vitals:   03/14/22 1033 03/14/22 1038  BP:  120/79  Pulse:  84  Resp:  18  Temp:  97.8 F (36.6 C)  TempSrc:  Tympanic  SpO2:  95%  Weight: 90 lb 10 oz (41.1 kg)    Physical Exam Constitutional:      General: She is not in acute distress.    Comments: She is sitting in a wheelchair and on home oxygen  Cardiovascular:     Rate and Rhythm: Normal rate and regular rhythm.     Heart sounds: Normal heart sounds.  Pulmonary:     Effort: Pulmonary effort is normal.     Breath sounds: Normal breath sounds.  Skin:    General: Skin is warm and dry.  Neurological:     Mental Status: She is alert and oriented to person, place, and time.         Latest Ref Rng & Units 03/14/2022    9:59 AM  CMP  Glucose 70 - 99 mg/dL 98   BUN 8 - 23 mg/dL 16   Creatinine 0.44 - 1.00 mg/dL 0.44   Sodium 135 - 145 mmol/L 133   Potassium 3.5 - 5.1 mmol/L 4.3   Chloride 98 - 111 mmol/L 97   CO2 22 - 32 mmol/L 27   Calcium 8.9 - 10.3 mg/dL 9.5   Total Protein 6.5 - 8.1 g/dL 6.7   Total Bilirubin 0.3 - 1.2 mg/dL 0.5   Alkaline Phos 38 - 126 U/L 64  AST 15 - 41 U/L 22   ALT 0 - 44 U/L 14       Latest Ref Rng & Units 03/14/2022    9:59 AM  CBC  WBC 4.0 - 10.5 K/uL 6.2   Hemoglobin 12.0 - 15.0 g/dL 12.4   Hematocrit 36.0 - 46.0 % 37.5   Platelets 150 - 400 K/uL 232      Assessment and plan- Patient is a 76 y.o. female with clinical prognostic stage IIIb invasive apocrine  carcinoma of the right breast, apocrine variety, T2 N1 M0 triple negative. She is here for on treatment assessment prior to cycle 11 of weekly Carbo-Taxol chemotherapy and new [place]. Carbo was added at cycle 3.   Labs reviewed and acceptable for continuation of treatment.  Proceed with cycle 11 of CarboTaxol chemotherapy.  She will see Dr. Janese Banks next week for cycle 12 which is her final cycle of CarboTaxol.  We reviewed rationale for [place]. Per Dr. Janese Banks, plan is to receive Keytruda every 3 weeks for up to 9 cycles. Reviewed risks including immune mediated side effects such as rash, diarrhea, endocrine toxicities. Risk of infusion reaction is low but could include rigors, chills, pruritis, flushing, fever, low blood pressure. Patient verbalizes understanding and wishes to proceed.   Plan is to start radiation after chemotherapy.   Weight loss- encouraged frequent, calorie dense meals.   Disposition:  Carbo-taxol-keytruda (new) today Rtc in 1 week for port/lab (cbc, cmp), Dr. Janese Banks, carbo-taxol #12 RTC in 3 weeks for port/lab (cbc, cmp), Dr. Janese Banks, Beryle Flock #2- la   Visit Diagnosis 1. Encounter for antineoplastic immunotherapy   2. Encounter for antineoplastic chemotherapy   3. Malignant neoplasm of upper-outer quadrant of right breast in female, estrogen receptor negative ([place])    Beckey Rutter, [place], AGNP-C [place] at [facility] [phone] (clinic) 03/14/2022

## 2022-03-14 NOTE — Progress Notes (Signed)
Returns for follow-up. No new concerns today.

## 2022-03-14 NOTE — Patient Instructions (Signed)
[facility]  Discharge Instructions: Thank you for choosing [facility] to provide your oncology and hematology care.  If you have a lab appointment with the Cancer Center, please go directly to the Cancer Center and check in at the registration area.  Wear comfortable clothing and clothing appropriate for easy access to any Portacath or PICC line.   We strive to give you quality time with your provider. You may need to reschedule your appointment if you arrive late (15 or more minutes).  Arriving late affects you and other patients whose appointments are after yours.  Also, if you miss three or more appointments without notifying the office, you may be dismissed from the clinic at the provider's discretion.      For prescription refill requests, have your pharmacy contact our office and allow 72 hours for refills to be completed.    Today you received the following chemotherapy and/or immunotherapy agents- Keytruda, Taxol, Carboplatin      To help prevent nausea and vomiting after your treatment, we encourage you to take your nausea medication as directed.  BELOW ARE SYMPTOMS THAT SHOULD BE REPORTED IMMEDIATELY: *FEVER GREATER THAN 100.4 F (38 C) OR HIGHER *CHILLS OR SWEATING *NAUSEA AND VOMITING THAT IS NOT CONTROLLED WITH YOUR NAUSEA MEDICATION *UNUSUAL SHORTNESS OF BREATH *UNUSUAL BRUISING OR BLEEDING *URINARY PROBLEMS (pain or burning when urinating, or frequent urination) *BOWEL PROBLEMS (unusual diarrhea, constipation, pain near the anus) TENDERNESS IN MOUTH AND THROAT WITH OR WITHOUT PRESENCE OF ULCERS (sore throat, sores in mouth, or a toothache) UNUSUAL RASH, SWELLING OR PAIN  UNUSUAL VAGINAL DISCHARGE OR ITCHING   Items with * indicate a potential emergency and should be followed up as soon as possible or go to the Emergency Department if any problems should occur.  Please show the CHEMOTHERAPY ALERT CARD or IMMUNOTHERAPY ALERT  CARD at check-in to the Emergency Department and triage nurse.  Should you have questions after your visit or need to cancel or reschedule your appointment, please contact [facility]  [phone] and follow the prompts.  Office hours are 8:00 a.m. to 4:30 p.m. Monday - Friday. Please note that voicemails left after 4:00 p.m. may not be returned until the following business day.  We are closed weekends and major holidays. You have access to a nurse at all times for urgent questions. Please call the main number to the clinic [phone] and follow the prompts.  For any non-urgent questions, you may also contact your provider using MyChart. We now offer e-Visits for anyone 18 and older to request care online for non-urgent symptoms. For details visit [email or web address].   Also download the MyChart app! Go to the app store, search "MyChart", open the app, select [facility], and log in with your MyChart username and password.  Masks are optional in the cancer centers. If you would like for your care team to wear a mask while they are taking care of you, please let them know. For doctor visits, patients may have with them one support person who is at least 76 years old. At this time, visitors are not allowed in the infusion area.   

## 2022-03-17 ENCOUNTER — Other Ambulatory Visit: Payer: Self-pay

## 2022-03-18 MED FILL — Dexamethasone Sodium Phosphate Inj 100 MG/10ML: INTRAMUSCULAR | Qty: 1 | Status: AC

## 2022-03-21 ENCOUNTER — Inpatient Hospital Stay: Payer: Medicare HMO

## 2022-03-21 ENCOUNTER — Inpatient Hospital Stay (HOSPITAL_BASED_OUTPATIENT_CLINIC_OR_DEPARTMENT_OTHER): Payer: Medicare HMO | Admitting: Oncology

## 2022-03-21 ENCOUNTER — Encounter: Payer: Self-pay | Admitting: Oncology

## 2022-03-21 VITALS — BP 110/61 | HR 91 | Temp 98.2°F | Resp 14 | Wt 94.3 lb

## 2022-03-21 DIAGNOSIS — Z5111 Encounter for antineoplastic chemotherapy: Secondary | ICD-10-CM

## 2022-03-21 DIAGNOSIS — C50411 Malignant neoplasm of upper-outer quadrant of right female breast: Secondary | ICD-10-CM

## 2022-03-21 DIAGNOSIS — Z171 Estrogen receptor negative status [ER-]: Secondary | ICD-10-CM

## 2022-03-21 LAB — CBC WITH DIFFERENTIAL/PLATELET
Abs Immature Granulocytes: 0.06 10*3/uL (ref 0.00–0.07)
Basophils Absolute: 0.1 10*3/uL (ref 0.0–0.1)
Basophils Relative: 1 %
Eosinophils Absolute: 0.1 10*3/uL (ref 0.0–0.5)
Eosinophils Relative: 1 %
HCT: 36.2 % (ref 36.0–46.0)
Hemoglobin: 11.8 g/dL — ABNORMAL LOW (ref 12.0–15.0)
Immature Granulocytes: 1 %
Lymphocytes Relative: 20 %
Lymphs Abs: 1.1 10*3/uL (ref 0.7–4.0)
MCH: 32.5 pg (ref 26.0–34.0)
MCHC: 32.6 g/dL (ref 30.0–36.0)
MCV: 99.7 fL (ref 80.0–100.0)
Monocytes Absolute: 0.7 10*3/uL (ref 0.1–1.0)
Monocytes Relative: 13 %
Neutro Abs: 3.6 10*3/uL (ref 1.7–7.7)
Neutrophils Relative %: 64 %
Platelets: 211 10*3/uL (ref 150–400)
RBC: 3.63 MIL/uL — ABNORMAL LOW (ref 3.87–5.11)
RDW: 18.6 % — ABNORMAL HIGH (ref 11.5–15.5)
WBC: 5.6 10*3/uL (ref 4.0–10.5)
nRBC: 0 % (ref 0.0–0.2)

## 2022-03-21 LAB — COMPREHENSIVE METABOLIC PANEL
ALT: 11 U/L (ref 0–44)
AST: 21 U/L (ref 15–41)
Albumin: 3.5 g/dL (ref 3.5–5.0)
Alkaline Phosphatase: 64 U/L (ref 38–126)
Anion gap: 7 (ref 5–15)
BUN: 18 mg/dL (ref 8–23)
CO2: 28 mmol/L (ref 22–32)
Calcium: 9.3 mg/dL (ref 8.9–10.3)
Chloride: 99 mmol/L (ref 98–111)
Creatinine, Ser: 0.41 mg/dL — ABNORMAL LOW (ref 0.44–1.00)
GFR, Estimated: >60 mL/min (ref >60)
Glucose, Bld: 179 mg/dL — ABNORMAL HIGH (ref 70–99)
Potassium: 3.8 mmol/L (ref 3.5–5.1)
Sodium: 134 mmol/L — ABNORMAL LOW (ref 135–145)
Total Bilirubin: 0.5 mg/dL (ref 0.3–1.2)
Total Protein: 6.6 g/dL (ref 6.5–8.1)

## 2022-03-21 MED ORDER — FAMOTIDINE IN NACL 20-0.9 MG/50ML-% IV SOLN
20.0000 mg | Freq: Once | INTRAVENOUS | Status: AC
  Administered 2022-03-21: 20 mg via INTRAVENOUS
  Filled 2022-03-21: qty 50

## 2022-03-21 MED ORDER — SODIUM CHLORIDE 0.9 % IV SOLN
10.0000 mg | Freq: Once | INTRAVENOUS | Status: AC
  Administered 2022-03-21: 10 mg via INTRAVENOUS
  Filled 2022-03-21: qty 10

## 2022-03-21 MED ORDER — SODIUM CHLORIDE 0.9 % IV SOLN
Freq: Once | INTRAVENOUS | Status: AC
  Filled 2022-03-21: qty 250

## 2022-03-21 MED ORDER — SODIUM CHLORIDE 0.9 % IV SOLN
65.0000 mg/m2 | Freq: Once | INTRAVENOUS | Status: AC
  Administered 2022-03-21: 90 mg via INTRAVENOUS
  Filled 2022-03-21: qty 15

## 2022-03-21 MED ORDER — DIPHENHYDRAMINE HCL 50 MG/ML IJ SOLN
50.0000 mg | Freq: Once | INTRAMUSCULAR | Status: AC
  Administered 2022-03-21: 50 mg via INTRAVENOUS
  Filled 2022-03-21: qty 1

## 2022-03-21 MED ORDER — SODIUM CHLORIDE 0.9 % IV SOLN
85.3500 mg | Freq: Once | INTRAVENOUS | Status: AC
  Administered 2022-03-21: 90 mg via INTRAVENOUS
  Filled 2022-03-21: qty 9

## 2022-03-21 MED ORDER — HEPARIN SOD (PORK) LOCK FLUSH 100 UNIT/ML IV SOLN
500.0000 [IU] | Freq: Once | INTRAVENOUS | Status: AC | PRN
  Administered 2022-03-21: 500 [IU]
  Filled 2022-03-21: qty 5

## 2022-03-21 NOTE — Progress Notes (Signed)
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  That incidentally showed right axillary adenopathy and soft tissue in the superior right breast.  This was followed by diagnostic right breast mammogram and ultrasound which showed a 2.3 x 1.6 x 1.2 cm irregular hypoechoic mass in the right breast at the 12 o'clock position 1 cm from the nipple.  Enlarged right axillary lymph node 1.8 cm.  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  Patient has oxygen dependent COPD.     PET CT scan shows FDG avid spiculated nodule with central cavitation in the left upper lobe.  Size an SUV of this nodule was not quantified on the PET scan.  Partially calcified markedly avid right breast lesion with markedly avid right axillary lymph node metastases.  Patient underwent 1 cycle of Taxol chemotherapy while awaiting surgery and underwent final surgery on 12/28/2021.  Final pathology showed18 mm grade 3 invasive mammary carcinoma triple negative. Macrometastases and at least for an micrometastases in 0 lymph nodes.  Largest size of metastatic deposit 30 mm.  Extranodal extension present.  pT1C pN2A   Given age and frailty plan is  to proceed with CarboTaxol chemotherapy alone for 12 cycles  Interval history-she is tolerating treatments well so far.  Denies any worsening neuropathy in her hands and feet.  She is concerned that she is losing her hair.  Breathing is at her baseline  ECOG PS- 2 Pain scale- 0   Review of systems- Review of Systems  Constitutional:  Positive for malaise/fatigue. Negative for chills, fever and weight loss.  HENT:  Negative for congestion, ear discharge and nosebleeds.   Eyes:  Negative for blurred vision.  Respiratory:  Positive for shortness of breath. Negative for cough, hemoptysis, sputum production and wheezing.   Cardiovascular:  Negative for chest pain, palpitations, orthopnea and claudication.  Gastrointestinal:  Negative for abdominal pain, blood in stool, constipation, diarrhea, heartburn, melena, nausea and vomiting.  Genitourinary:  Negative for dysuria, flank pain, frequency, hematuria and urgency.  Musculoskeletal:  Negative for back pain, joint pain and myalgias.  Skin:  Negative for rash.  Neurological:  Negative for dizziness, tingling, focal weakness, seizures, weakness and headaches.  Endo/Heme/Allergies:  Does not bruise/bleed easily.  Psychiatric/Behavioral:  Negative for depression and suicidal ideas. The patient does not have insomnia.       Allergies  Allergen Reactions   Penicillins Rash and Other (See Comments)     Past Medical History:  Diagnosis Date   Arthritis    Cancer ([place])    CHF (congestive heart failure) (HCC)    COPD (chronic obstructive pulmonary disease) (HCC)    Dementia ([place])    Hyperlipidemia      Past Surgical History:  Procedure Laterality Date   BREAST BIOPSY Right 10/14/2021  rt 12:00 [place] bx venus clip path pending   BREAST BIOPSY Right 10/14/2021   rt axilla hydromarker path pending   EYE SURGERY Bilateral 2019   cataract   MASTECTOMY MODIFIED RADICAL Right 12/28/2021   Procedure: MASTECTOMY MODIFIED RADICAL;  Surgeon:  Herbert Pun, MD;  Location: [facility] ORS;  Service: General;  Laterality: Right;   PORTACATH PLACEMENT N/A 11/25/2021   Procedure: INSERTION PORT-A-CATH;  Surgeon: Herbert Pun, MD;  Location: [facility] ORS;  Service: General;  Laterality: N/A;    Social History   Socioeconomic History   Marital status: Divorced    Spouse name: Not on file   Number of children: Not on file   Years of education: Not on file   Highest education level: Not on file  Occupational History   Not on file  Tobacco Use   Smoking status: Former    Types: Cigarettes    Quit date: 10/2019    Years since quitting: 2.3    Passive exposure: Past   Smokeless tobacco: Never  Vaping Use   Vaping Use: Never used  Substance and Sexual Activity   Alcohol use: Not Currently   Drug use: Not Currently   Sexual activity: Not Currently  Other Topics Concern   Not on file  Social History Narrative   Not on file   Social Determinants of Health   Financial Resource Strain: Not on file  Food Insecurity: Not on file  Transportation Needs: Not on file  Physical Activity: Not on file  Stress: Not on file  Social Connections: Not on file  Intimate Partner Violence: Not on file    Family History  Problem Relation Age of Onset   Breast cancer Mother    Lung cancer Sister      Current Outpatient Medications:    acetaminophen (TYLENOL) 325 MG tablet, Take 650 mg by mouth See admin instructions. Take 2 tablets (650 mg) by mouth 3 times daily scheduled (0800, 1400 & 2000) & take 2 tablets (650 mg) by mouth up to twice daily as needed for pain., Disp: , Rfl:    albuterol (VENTOLIN HFA) 108 (90 Base) MCG/ACT inhaler, Inhale 2 puffs into the lungs every 6 (six) hours as needed for shortness of breath or wheezing., Disp: , Rfl:    Albuterol Sulfate 2.5 MG/0.5ML NEBU, Inhale into the lungs as needed., Disp: , Rfl:    alendronate (FOSAMAX) 70 MG tablet, Take 70 mg by mouth every Monday., Disp: , Rfl:    ammonium  lactate (LAC-HYDRIN) 12 % lotion, Apply 1 application. topically daily. (0800) Lower legs., Disp: , Rfl:    atorvastatin (LIPITOR) 20 MG tablet, Take 20 mg by mouth daily. (0800), Disp: , Rfl:    budesonide (PULMICORT) 0.5 MG/2ML nebulizer solution, Inhale 0.5 mg into the lungs daily. (0800), Disp: , Rfl:    calcium carbonate (TUMS EX) 750 MG chewable tablet, Chew 1 tablet by mouth in the morning and at bedtime. (0800 & 2000), Disp: , Rfl:    Cholecalciferol (VITAMIN D3) 50 MCG (2000 UT) TABS, Take 2,000 Units by mouth daily. (0800), Disp: , Rfl:    INCRUSE ELLIPTA 62.5 MCG/ACT AEPB, Inhale 1 puff into the lungs daily. (0800), Disp: , Rfl:    lidocaine-prilocaine (EMLA) cream, Apply to affected area once, Disp: 30 g, Rfl: 3   LUMIGAN 0.01 % SOLN, Place 1 drop into both eyes at bedtime. (2000), Disp: , Rfl:    metoprolol succinate (TOPROL-XL) 25 MG 24 hr tablet, Take 25 mg by mouth  daily. (0800) Hold if SBP<100 or Pulse <60., Disp: , Rfl:    Multiple Vitamins-Minerals (HEALTHY EYES SUPERVISION 2 PO), Take 1 capsule by mouth in the morning and at bedtime. (0730 & 1730), Disp: , Rfl:    OXYGEN, Inhale 2 L/min into the lungs as needed (shortness of breath OR if pulse ox is <90% on room air)., Disp: , Rfl:    potassium chloride SA (KLOR-CON M) 20 MEQ tablet, Take 20 mEq by mouth daily. (0800), Disp: , Rfl:    traMADol (ULTRAM) 50 MG tablet, Take 1 tablet (50 mg total) by mouth every 6 (six) hours as needed., Disp: 20 tablet, Rfl: 0   guaiFENesin (MUCINEX) 600 MG 12 hr tablet, Take 600 mg by mouth 2 (two) times daily as needed (congestion.). (Patient not taking: Reported on 03/07/2022), Disp: , Rfl:    ondansetron (ZOFRAN) 8 MG tablet, Take 1 tablet (8 mg total) by mouth 2 (two) times daily as needed (Nausea or vomiting). (Patient not taking: Reported on 03/07/2022), Disp: 30 tablet, Rfl: 1   prochlorperazine (COMPAZINE) 10 MG tablet, Take 1 tablet (10 mg total) by mouth every 6 (six) hours as needed (Nausea or  vomiting). (Patient not taking: Reported on 03/07/2022), Disp: 30 tablet, Rfl: 1 No current facility-administered medications for this visit.  Facility-Administered Medications Ordered in Other Visits:    diphenhydrAMINE (BENADRYL) 50 MG/ML injection, , , ,    famotidine (PEPCID) 20-0.9 MG/50ML-% IVPB, , , ,   Physical exam:  Vitals:   03/21/22 0904  BP: 110/61  Pulse: 91  Resp: 14  Temp: 98.2 F (36.8 C)  SpO2: 95%  Weight: 94 lb 4.8 oz (42.8 kg)   Physical Exam Constitutional:      Comments: Thin elderly female sitting in a wheelchair.  She is on home oxygen.  Appears in no acute distress  Cardiovascular:     Rate and Rhythm: Normal rate and regular rhythm.     Heart sounds: Normal heart sounds.  Pulmonary:     Comments: Breath sounds decreased bilaterally diffusely Abdominal:     General: Bowel sounds are normal.     Palpations: Abdomen is soft.  Skin:    General: Skin is warm and dry.  Neurological:     Mental Status: She is alert and oriented to person, place, and time.         Latest Ref Rng & Units 03/21/2022    8:50 AM  CMP  Glucose 70 - 99 mg/dL 179   BUN 8 - 23 mg/dL 18   Creatinine 0.44 - 1.00 mg/dL 0.41   Sodium 135 - 145 mmol/L 134   Potassium 3.5 - 5.1 mmol/L 3.8   Chloride 98 - 111 mmol/L 99   CO2 22 - 32 mmol/L 28   Calcium 8.9 - 10.3 mg/dL 9.3   Total Protein 6.5 - 8.1 g/dL 6.6   Total Bilirubin 0.3 - 1.2 mg/dL 0.5   Alkaline Phos 38 - 126 U/L 64   AST 15 - 41 U/L 21   ALT 0 - 44 U/L 11       Latest Ref Rng & Units 03/21/2022    8:50 AM  CBC  WBC 4.0 - 10.5 K/uL 5.6   Hemoglobin 12.0 - 15.0 g/dL 11.8   Hematocrit 36.0 - 46.0 % 36.2   Platelets 150 - 400 K/uL 211    Assessment and plan- Patient is a 76 y.o. female with clinical prognostic stage IIIb invasive apocrine  carcinoma of the right  breast apocrine variety T2 N1 M0 triple negative.  She is here for on treatment assessment prior to cycle 12 of weekly CarboTaxol chemotherapy  Counts  okay to proceed with cycle 12 of weekly CarboTaxol chemotherapy which would be her last cycle.  Patient did receive Keytruda on 03/14/2022.  She will continue to receive this every 3 weeks for 9 cycles.  I will see her back in 2 weeks with port labs CBC with differential CMP for cycle 2 of Keytruda.  Patient is also met with radiation oncology and will likely start radiation treatment next month which can go concurrently with [facility].  Given her age performance status and baseline COPD I do not plan to offer her [facility] chemotherapy following completion of CarboTaxol chemotherapy   Visit Diagnosis 1. Malignant neoplasm of upper-outer quadrant of right breast in female, estrogen receptor negative ([place])   2. Encounter for antineoplastic chemotherapy      Dr. Randa Evens, MD, MPH [facility] at [facility] [phone] 03/21/2022 9:16 AM

## 2022-03-21 NOTE — Patient Instructions (Signed)
[facility] [place]  Discharge Instructions: Thank you for choosing [facility] to provide your oncology and hematology care.  If you have a lab appointment with the Cancer Center, please go directly to the Cancer Center and check in at the registration area.  Wear comfortable clothing and clothing appropriate for easy access to any Portacath or PICC line.   We strive to give you quality time with your provider. You may need to reschedule your appointment if you arrive late (15 or more minutes).  Arriving late affects you and other patients whose appointments are after yours.  Also, if you miss three or more appointments without notifying the office, you may be dismissed from the clinic at the provider's discretion.      For prescription refill requests, have your pharmacy contact our office and allow 72 hours for refills to be completed.    Today you received the following chemotherapy and/or immunotherapy agents Taxol and Carboplatin       To help prevent nausea and vomiting after your treatment, we encourage you to take your nausea medication as directed.  BELOW ARE SYMPTOMS THAT SHOULD BE REPORTED IMMEDIATELY: *FEVER GREATER THAN 100.4 F (38 C) OR HIGHER *CHILLS OR SWEATING *NAUSEA AND VOMITING THAT IS NOT CONTROLLED WITH YOUR NAUSEA MEDICATION *UNUSUAL SHORTNESS OF BREATH *UNUSUAL BRUISING OR BLEEDING *URINARY PROBLEMS (pain or burning when urinating, or frequent urination) *BOWEL PROBLEMS (unusual diarrhea, constipation, pain near the anus) TENDERNESS IN MOUTH AND THROAT WITH OR WITHOUT PRESENCE OF ULCERS (sore throat, sores in mouth, or a toothache) UNUSUAL RASH, SWELLING OR PAIN  UNUSUAL VAGINAL DISCHARGE OR ITCHING   Items with * indicate a potential emergency and should be followed up as soon as possible or go to the Emergency Department if any problems should occur.  Please show the CHEMOTHERAPY ALERT CARD or IMMUNOTHERAPY ALERT CARD at  check-in to the Emergency Department and triage nurse.  Should you have questions after your visit or need to cancel or reschedule your appointment, please contact [facility] [place]  [phone] and follow the prompts.  Office hours are 8:00 a.m. to 4:30 p.m. Monday - Friday. Please note that voicemails left after 4:00 p.m. may not be returned until the following business day.  We are closed weekends and major holidays. You have access to a nurse at all times for urgent questions. Please call the main number to the clinic [phone] and follow the prompts.  For any non-urgent questions, you may also contact your provider using MyChart. We now offer e-Visits for anyone 18 and older to request care online for non-urgent symptoms. For details visit [email or web address].   Also download the MyChart app! Go to the app store, search "MyChart", open the app, select [place], and log in with your MyChart username and password.  Masks are optional in the cancer centers. If you would like for your care team to wear a mask while they are taking care of you, please let them know. For doctor visits, patients may have with them one support person who is at least 76 years old. At this time, visitors are not allowed in the infusion area.  

## 2022-04-05 ENCOUNTER — Inpatient Hospital Stay (HOSPITAL_BASED_OUTPATIENT_CLINIC_OR_DEPARTMENT_OTHER): Payer: Medicare HMO | Admitting: Oncology

## 2022-04-05 ENCOUNTER — Inpatient Hospital Stay: Payer: Medicare HMO

## 2022-04-05 ENCOUNTER — Inpatient Hospital Stay: Payer: Medicare HMO | Attending: Oncology

## 2022-04-05 ENCOUNTER — Encounter: Payer: Self-pay | Admitting: Oncology

## 2022-04-05 VITALS — BP 94/54 | HR 59 | Temp 98.0°F | Resp 16 | Ht 62.0 in | Wt 94.6 lb

## 2022-04-05 DIAGNOSIS — Z171 Estrogen receptor negative status [ER-]: Secondary | ICD-10-CM

## 2022-04-05 DIAGNOSIS — C50411 Malignant neoplasm of upper-outer quadrant of right female breast: Secondary | ICD-10-CM | POA: Diagnosis present

## 2022-04-05 DIAGNOSIS — Z5112 Encounter for antineoplastic immunotherapy: Secondary | ICD-10-CM

## 2022-04-05 DIAGNOSIS — Z79899 Other long term (current) drug therapy: Secondary | ICD-10-CM | POA: Insufficient documentation

## 2022-04-05 DIAGNOSIS — C773 Secondary and unspecified malignant neoplasm of axilla and upper limb lymph nodes: Secondary | ICD-10-CM | POA: Insufficient documentation

## 2022-04-05 LAB — COMPREHENSIVE METABOLIC PANEL
ALT: 11 U/L (ref 0–44)
AST: 19 U/L (ref 15–41)
Albumin: 3.5 g/dL (ref 3.5–5.0)
Alkaline Phosphatase: 87 U/L (ref 38–126)
Anion gap: 10 (ref 5–15)
BUN: 17 mg/dL (ref 8–23)
CO2: 29 mmol/L (ref 22–32)
Calcium: 9.3 mg/dL (ref 8.9–10.3)
Chloride: 98 mmol/L (ref 98–111)
Creatinine, Ser: 0.56 mg/dL (ref 0.44–1.00)
GFR, Estimated: >60 mL/min (ref >60)
Glucose, Bld: 105 mg/dL — ABNORMAL HIGH (ref 70–99)
Potassium: 4.3 mmol/L (ref 3.5–5.1)
Sodium: 137 mmol/L (ref 135–145)
Total Bilirubin: 0.4 mg/dL (ref 0.3–1.2)
Total Protein: 6.9 g/dL (ref 6.5–8.1)

## 2022-04-05 LAB — CBC WITH DIFFERENTIAL/PLATELET
Abs Immature Granulocytes: 0.15 10*3/uL — ABNORMAL HIGH (ref 0.00–0.07)
Basophils Absolute: 0.1 10*3/uL (ref 0.0–0.1)
Basophils Relative: 1 %
Eosinophils Absolute: 0.1 10*3/uL (ref 0.0–0.5)
Eosinophils Relative: 1 %
HCT: 37.6 % (ref 36.0–46.0)
Hemoglobin: 12.1 g/dL (ref 12.0–15.0)
Immature Granulocytes: 2 %
Lymphocytes Relative: 11 %
Lymphs Abs: 1 10*3/uL (ref 0.7–4.0)
MCH: 32.4 pg (ref 26.0–34.0)
MCHC: 32.2 g/dL (ref 30.0–36.0)
MCV: 100.8 fL — ABNORMAL HIGH (ref 80.0–100.0)
Monocytes Absolute: 1.2 10*3/uL — ABNORMAL HIGH (ref 0.1–1.0)
Monocytes Relative: 13 %
Neutro Abs: 6.6 10*3/uL (ref 1.7–7.7)
Neutrophils Relative %: 72 %
Platelets: 242 10*3/uL (ref 150–400)
RBC: 3.73 MIL/uL — ABNORMAL LOW (ref 3.87–5.11)
RDW: 17.8 % — ABNORMAL HIGH (ref 11.5–15.5)
WBC: 9.1 10*3/uL (ref 4.0–10.5)
nRBC: 0 % (ref 0.0–0.2)

## 2022-04-05 MED ORDER — SODIUM CHLORIDE 0.9 % IV SOLN
Freq: Once | INTRAVENOUS | Status: AC
  Filled 2022-04-05: qty 250

## 2022-04-05 MED ORDER — SODIUM CHLORIDE 0.9 % IV SOLN
200.0000 mg | Freq: Once | INTRAVENOUS | Status: AC
  Administered 2022-04-05: 200 mg via INTRAVENOUS
  Filled 2022-04-05: qty 8

## 2022-04-05 MED ORDER — HEPARIN SOD (PORK) LOCK FLUSH 100 UNIT/ML IV SOLN
500.0000 [IU] | Freq: Once | INTRAVENOUS | Status: AC | PRN
  Administered 2022-04-05: 500 [IU]
  Filled 2022-04-05: qty 5

## 2022-04-05 NOTE — Progress Notes (Signed)
START OFF PATHWAY REGIMEN - Breast   [id number]:Pembrolizumab 200 mg IV D1 q21 Days:   A cycle is every 21 days:     Pembrolizumab   **Always confirm dose/schedule in your pharmacy ordering system**  Patient Characteristics: Postoperative without Neoadjuvant Therapy (Pathologic Staging), Invasive Disease, Adjuvant Therapy, HER2 Negative/Unknown/Equivocal, ER Negative/Unknown, Node Positive Therapeutic Status: Postoperative without Neoadjuvant Therapy (Pathologic Staging) AJCC Grade: G3 AJCC N Category: pN2a AJCC M Category: cM0 ER Status: Negative (-) AJCC 8 Stage Grouping: IIIC HER2 Status: Negative (-) Oncotype Dx Recurrence Score: Not Appropriate AJCC T Category: pT1c PR Status: Negative (-) Adjuvant Therapy Status: No Adjuvant Therapy Received Yet or Changing Initial Adjuvant Regimen due to Tolerance Intent of Therapy: Curative Intent, Discussed with Patient

## 2022-04-05 NOTE — Patient Instructions (Signed)
[facility] [place]  Discharge Instructions: Thank you for choosing [facility] to provide your oncology and hematology care.  If you have a lab appointment with the Cancer Center, please go directly to the Cancer Center and check in at the registration area.  Wear comfortable clothing and clothing appropriate for easy access to any Portacath or PICC line.   We strive to give you quality time with your provider. You may need to reschedule your appointment if you arrive late (15 or more minutes).  Arriving late affects you and other patients whose appointments are after yours.  Also, if you miss three or more appointments without notifying the office, you may be dismissed from the clinic at the provider's discretion.      For prescription refill requests, have your pharmacy contact our office and allow 72 hours for refills to be completed.    Today you received the following chemotherapy and/or immunotherapy agents Keytruda      To help prevent nausea and vomiting after your treatment, we encourage you to take your nausea medication as directed.  BELOW ARE SYMPTOMS THAT SHOULD BE REPORTED IMMEDIATELY: *FEVER GREATER THAN 100.4 F (38 C) OR HIGHER *CHILLS OR SWEATING *NAUSEA AND VOMITING THAT IS NOT CONTROLLED WITH YOUR NAUSEA MEDICATION *UNUSUAL SHORTNESS OF BREATH *UNUSUAL BRUISING OR BLEEDING *URINARY PROBLEMS (pain or burning when urinating, or frequent urination) *BOWEL PROBLEMS (unusual diarrhea, constipation, pain near the anus) TENDERNESS IN MOUTH AND THROAT WITH OR WITHOUT PRESENCE OF ULCERS (sore throat, sores in mouth, or a toothache) UNUSUAL RASH, SWELLING OR PAIN  UNUSUAL VAGINAL DISCHARGE OR ITCHING   Items with * indicate a potential emergency and should be followed up as soon as possible or go to the Emergency Department if any problems should occur.  Please show the CHEMOTHERAPY ALERT CARD or IMMUNOTHERAPY ALERT CARD at check-in to  the Emergency Department and triage nurse.  Should you have questions after your visit or need to cancel or reschedule your appointment, please contact [facility] [place]  [phone] and follow the prompts.  Office hours are 8:00 a.m. to 4:30 p.m. Monday - Friday. Please note that voicemails left after 4:00 p.m. may not be returned until the following business day.  We are closed weekends and major holidays. You have access to a nurse at all times for urgent questions. Please call the main number to the clinic [phone] and follow the prompts.  For any non-urgent questions, you may also contact your provider using MyChart. We now offer e-Visits for anyone 18 and older to request care online for non-urgent symptoms. For details visit [email or web address].   Also download the MyChart app! Go to the app store, search "MyChart", open the app, select [place], and log in with your MyChart username and password.  Masks are optional in the cancer centers. If you would like for your care team to wear a mask while they are taking care of you, please let them know. For doctor visits, patients may have with them one support person who is at least 76 years old. At this time, visitors are not allowed in the infusion area.   

## 2022-04-05 NOTE — Progress Notes (Signed)
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  That incidentally showed right axillary adenopathy and soft tissue in the superior right breast.  This was followed by diagnostic right breast mammogram and ultrasound which showed a 2.3 x 1.6 x 1.2 cm irregular hypoechoic mass in the right breast at the 12 o'clock position 1 cm from the nipple.  Enlarged right axillary lymph node 1.8 cm.  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  Patient has oxygen dependent COPD.     PET CT scan shows FDG avid spiculated nodule with central cavitation in the left upper lobe.  Size an SUV of this nodule was not quantified on the PET scan.  Partially calcified markedly avid right breast lesion with markedly avid right axillary lymph node metastases.  Patient underwent 1 cycle of Taxol chemotherapy while awaiting surgery and underwent final surgery on 12/28/2021.  Final pathology showed18 mm grade 3 invasive mammary carcinoma triple negative. Macrometastases and at least for an micrometastases in 0 lymph nodes.  Largest size of metastatic deposit 30 mm.  Extranodal extension present.  pT1C pN2A   Given age and frailty plan is to proceed with  CarboTaxol chemotherapy alone for 12 cycles.  Beryle Flock was added with cycle 12 of CarboTaxol and plan is to continue 9 cycles of adjuvant Keytruda  Interval history-tolerating treatments well so far.  Reports occasional pain in her right upper extremity..  She has baseline fatigue and exertional shortness of breath.  No recent falls  ECOG PS- 2 Pain scale- 0 Opioid associated constipation- no  Review of systems- Review of Systems  Constitutional:  Positive for malaise/fatigue. Negative for chills, fever and weight loss.  HENT:  Negative for congestion, ear discharge and nosebleeds.   Eyes:  Negative for blurred vision.  Respiratory:  Negative for cough, hemoptysis, sputum production, shortness of breath and wheezing.   Cardiovascular:  Negative for chest pain, palpitations, orthopnea and claudication.  Gastrointestinal:  Negative for abdominal pain, blood in stool, constipation, diarrhea, heartburn, melena, nausea and vomiting.  Genitourinary:  Negative for dysuria, flank pain, frequency, hematuria and urgency.  Musculoskeletal:  Negative for back pain, joint pain and myalgias.  Skin:  Negative for rash.  Neurological:  Negative for dizziness, tingling, focal weakness, seizures, weakness and headaches.  Endo/Heme/Allergies:  Does not bruise/bleed easily.  Psychiatric/Behavioral:  Negative for depression and suicidal ideas. The patient does not have insomnia.       Allergies  Allergen Reactions   Penicillins Rash and Other (See Comments)     Past Medical History:  Diagnosis Date   Arthritis    Cancer ([place])    CHF (congestive heart failure) (HCC)    COPD (chronic obstructive pulmonary disease) (HCC)    Dementia (HCC)    Hyperlipidemia      Past Surgical  History:  Procedure Laterality Date   BREAST BIOPSY Right 10/14/2021   rt 12:00 [place] bx venus clip path pending   BREAST BIOPSY Right 10/14/2021   rt axilla hydromarker path pending   EYE SURGERY Bilateral 2019   cataract    MASTECTOMY MODIFIED RADICAL Right 12/28/2021   Procedure: MASTECTOMY MODIFIED RADICAL;  Surgeon: Herbert Pun, MD;  Location: [facility] ORS;  Service: General;  Laterality: Right;   PORTACATH PLACEMENT N/A 11/25/2021   Procedure: INSERTION PORT-A-CATH;  Surgeon: Herbert Pun, MD;  Location: [facility] ORS;  Service: General;  Laterality: N/A;    Social History   Socioeconomic History   Marital status: Divorced    Spouse name: Not on file   Number of children: Not on file   Years of education: Not on file   Highest education level: Not on file  Occupational History   Not on file  Tobacco Use   Smoking status: Former    Types: Cigarettes    Quit date: 10/2019    Years since quitting: 2.4    Passive exposure: Past   Smokeless tobacco: Never  Vaping Use   Vaping Use: Never used  Substance and Sexual Activity   Alcohol use: Not Currently   Drug use: Not Currently   Sexual activity: Not Currently  Other Topics Concern   Not on file  Social History Narrative   Not on file   Social Determinants of Health   Financial Resource Strain: Not on file  Food Insecurity: Not on file  Transportation Needs: Not on file  Physical Activity: Not on file  Stress: Not on file  Social Connections: Not on file  Intimate Partner Violence: Not on file    Family History  Problem Relation Age of Onset   Breast cancer Mother    Lung cancer Sister      Current Outpatient Medications:    acetaminophen (TYLENOL) 325 MG tablet, Take 650 mg by mouth See admin instructions. Take 2 tablets (650 mg) by mouth 3 times daily scheduled (0800, 1400 & 2000) & take 2 tablets (650 mg) by mouth up to twice daily as needed for pain., Disp: , Rfl:    albuterol (VENTOLIN HFA) 108 (90 Base) MCG/ACT inhaler, Inhale 2 puffs into the lungs every 6 (six) hours as needed for shortness of breath or wheezing., Disp: , Rfl:    Albuterol Sulfate 2.5 MG/0.5ML NEBU, Inhale into the lungs as needed., Disp: , Rfl:     alendronate (FOSAMAX) 70 MG tablet, Take 70 mg by mouth every Monday., Disp: , Rfl:    ammonium lactate (LAC-HYDRIN) 12 % lotion, Apply 1 application. topically daily. (0800) Lower legs., Disp: , Rfl:    atorvastatin (LIPITOR) 20 MG tablet, Take 20 mg by mouth daily. (0800), Disp: , Rfl:    budesonide (PULMICORT) 0.5 MG/2ML nebulizer solution, Inhale 0.5 mg into the lungs daily. (0800), Disp: , Rfl:    calcium carbonate (TUMS EX) 750 MG chewable tablet, Chew 1 tablet by mouth in the morning and at bedtime. (0800 & 2000), Disp: , Rfl:    Cholecalciferol (VITAMIN D3) 50 MCG (2000 UT) TABS, Take 2,000 Units by mouth daily. (0800), Disp: , Rfl:    guaiFENesin (MUCINEX) 600 MG 12 hr tablet, Take 600 mg by mouth 2 (two) times daily as needed (congestion.). (Patient not taking: Reported on 03/07/2022), Disp: , Rfl:    INCRUSE ELLIPTA 62.5 MCG/ACT AEPB, Inhale 1 puff into the lungs daily. (0800), Disp: , Rfl:    LUMIGAN 0.01 %  SOLN, Place 1 drop into both eyes at bedtime. (2000), Disp: , Rfl:    metoprolol succinate (TOPROL-XL) 25 MG 24 hr tablet, Take 25 mg by mouth daily. (0800) Hold if SBP<100 or Pulse <60., Disp: , Rfl:    Multiple Vitamins-Minerals (HEALTHY EYES SUPERVISION 2 PO), Take 1 capsule by mouth in the morning and at bedtime. (0730 & 1730), Disp: , Rfl:    OXYGEN, Inhale 2 L/min into the lungs as needed (shortness of breath OR if pulse ox is <90% on room air)., Disp: , Rfl:    potassium chloride SA (KLOR-CON M) 20 MEQ tablet, Take 20 mEq by mouth daily. (0800), Disp: , Rfl:    traMADol (ULTRAM) 50 MG tablet, Take 1 tablet (50 mg total) by mouth every 6 (six) hours as needed., Disp: 20 tablet, Rfl: 0 No current facility-administered medications for this visit.  Facility-Administered Medications Ordered in Other Visits:    diphenhydrAMINE (BENADRYL) 50 MG/ML injection, , , ,    famotidine (PEPCID) 20-0.9 MG/50ML-% IVPB, , , ,   Physical exam:  Vitals:   04/05/22 0918  BP: (!) 94/54  Pulse:  (!) 59  Resp: 16  Temp: 98 F (36.7 C)  TempSrc: Oral  Weight: 94 lb 9.6 oz (42.9 kg)  Height: '5\' 2"'$  (1.575 m)   Physical Exam Constitutional:      General: She is not in acute distress.    Comments: Sitting in a wheelchair  Cardiovascular:     Rate and Rhythm: Normal rate and regular rhythm.     Heart sounds: Normal heart sounds.  Pulmonary:     Effort: Pulmonary effort is normal.     Breath sounds: Normal breath sounds.  Skin:    General: Skin is warm and dry.  Neurological:     Mental Status: She is alert and oriented to person, place, and time.         Latest Ref Rng & Units 04/05/2022    8:49 AM  CMP  Glucose 70 - 99 mg/dL 105   BUN 8 - 23 mg/dL 17   Creatinine 0.44 - 1.00 mg/dL 0.56   Sodium 135 - 145 mmol/L 137   Potassium 3.5 - 5.1 mmol/L 4.3   Chloride 98 - 111 mmol/L 98   CO2 22 - 32 mmol/L 29   Calcium 8.9 - 10.3 mg/dL 9.3   Total Protein 6.5 - 8.1 g/dL 6.9   Total Bilirubin 0.3 - 1.2 mg/dL 0.4   Alkaline Phos 38 - 126 U/L 87   AST 15 - 41 U/L 19   ALT 0 - 44 U/L 11       Latest Ref Rng & Units 04/05/2022    8:49 AM  CBC  WBC 4.0 - 10.5 K/uL 9.1   Hemoglobin 12.0 - 15.0 g/dL 12.1   Hematocrit 36.0 - 46.0 % 37.6   Platelets 150 - 400 K/uL 242         Assessment and plan- Patient is a 76 y.o. female with clinical prognostic stage IIIb invasive apocrine  carcinoma of the right breast apocrine variety T2 N1 M0 triple negative.  She is here for on treatment assessment prior to cycle 2 of adjuvant Keytruda.  She has completed 12 weekly cycles of CarboTaxol chemotherapy  Counts okay to proceed with cycle 2 of adjuvant Keytruda today.  I will see her back in 3 weeks for cycle 3.  Plan is to complete 9 cycles.  Patient will be starting adjuvant radiation therapy in about  2 weeks time.  Given that patient had a mastectomy and now undergoing radiation therapy as well she has an increased risk of lymphedema.  I am referring her to [facility] at this time.    Visit Diagnosis 1. Malignant neoplasm of upper-outer quadrant of right breast in female, estrogen receptor negative ([place])   2. Encounter for antineoplastic immunotherapy      Dr. Randa Evens, MD, MPH [facility] [phone] 04/05/2022 11:24 AM

## 2022-04-05 NOTE — Progress Notes (Signed)
OFF PATHWAY REGIMEN - Breast  No Change  Continue With Treatment as Ordered.  Original Decision Date/Time: 11/26/2021 08:53   [id number]:Carboplatin + weekly Paclitaxel (6/100) q28d:   A cycle is every 28 days:     Paclitaxel      Carboplatin   **Always confirm dose/schedule in your pharmacy ordering system**  Patient Characteristics: Preoperative or Nonsurgical Candidate (Clinical Staging), Neoadjuvant Therapy followed by Surgery, Invasive Disease, Chemotherapy, HER2 Negative/Unknown/Equivocal, ER Negative/Unknown, Platinum Therapy Indicated and Not a Candidate for Checkpoint  Inhibitor Therapeutic Status: Preoperative or Nonsurgical Candidate (Clinical Staging) AJCC M Category: cM0 AJCC Grade: G3 Breast Surgical Plan: Neoadjuvant Therapy followed by Surgery ER Status: Negative (-) AJCC 8 Stage Grouping: IIIB HER2 Status: Negative (-) AJCC T Category: cT2 AJCC N Category: cN1 PR Status: Negative (-) Intent of Therapy: Non-Curative / Palliative Intent, Discussed with Patient

## 2022-04-06 ENCOUNTER — Other Ambulatory Visit: Payer: Self-pay

## 2022-04-11 ENCOUNTER — Other Ambulatory Visit: Payer: Self-pay

## 2022-04-11 ENCOUNTER — Encounter: Payer: Self-pay | Admitting: *Deleted

## 2022-04-12 ENCOUNTER — Ambulatory Visit: Admission: RE | Admit: 2022-04-12 | Payer: Medicare HMO | Source: Ambulatory Visit

## 2022-04-12 ENCOUNTER — Other Ambulatory Visit: Payer: Self-pay

## 2022-04-12 DIAGNOSIS — C50411 Malignant neoplasm of upper-outer quadrant of right female breast: Secondary | ICD-10-CM | POA: Insufficient documentation

## 2022-04-12 DIAGNOSIS — Z510 Encounter for antineoplastic radiation therapy: Secondary | ICD-10-CM | POA: Diagnosis present

## 2022-04-13 ENCOUNTER — Other Ambulatory Visit: Payer: Self-pay | Admitting: *Deleted

## 2022-04-13 ENCOUNTER — Other Ambulatory Visit: Payer: Self-pay

## 2022-04-13 DIAGNOSIS — C50411 Malignant neoplasm of upper-outer quadrant of right female breast: Secondary | ICD-10-CM

## 2022-04-14 ENCOUNTER — Other Ambulatory Visit: Payer: Self-pay

## 2022-04-19 ENCOUNTER — Inpatient Hospital Stay: Payer: Medicare HMO

## 2022-04-19 ENCOUNTER — Ambulatory Visit: Admission: RE | Admit: 2022-04-19 | Payer: Medicare HMO | Source: Ambulatory Visit

## 2022-04-19 DIAGNOSIS — Z510 Encounter for antineoplastic radiation therapy: Secondary | ICD-10-CM | POA: Diagnosis not present

## 2022-04-20 ENCOUNTER — Other Ambulatory Visit: Payer: Self-pay

## 2022-04-20 ENCOUNTER — Encounter: Payer: Self-pay | Admitting: *Deleted

## 2022-04-20 ENCOUNTER — Ambulatory Visit
Admission: RE | Admit: 2022-04-20 | Discharge: 2022-04-20 | Disposition: A | Discharge status: Home / Self Care | Payer: Medicare HMO | Source: Ambulatory Visit | Attending: Radiation Oncology | Admitting: Radiation Oncology

## 2022-04-20 ENCOUNTER — Inpatient Hospital Stay: Payer: Medicare HMO

## 2022-04-20 DIAGNOSIS — Z510 Encounter for antineoplastic radiation therapy: Secondary | ICD-10-CM | POA: Diagnosis not present

## 2022-04-20 LAB — RAD ONC ARIA SESSION SUMMARY
Course Elapsed Days: 0
Plan Fractions Treated to Date: 1
Plan Prescribed Dose Per Fraction: 1.8 Gy
Plan Total Fractions Prescribed: 28
Plan Total Prescribed Dose: 50.4 Gy
Reference Point Dosage Given to Date: 1.8 Gy
Reference Point Session Dosage Given: 1.8 Gy
Session Number: 1

## 2022-04-21 ENCOUNTER — Encounter: Payer: Self-pay | Admitting: *Deleted

## 2022-04-21 ENCOUNTER — Other Ambulatory Visit: Payer: Self-pay

## 2022-04-21 ENCOUNTER — Ambulatory Visit
Admission: RE | Admit: 2022-04-21 | Discharge: 2022-04-21 | Disposition: A | Discharge status: Home / Self Care | Payer: Medicare HMO | Source: Ambulatory Visit | Attending: Radiation Oncology | Admitting: Radiation Oncology

## 2022-04-21 ENCOUNTER — Inpatient Hospital Stay: Payer: Medicare HMO

## 2022-04-21 DIAGNOSIS — Z510 Encounter for antineoplastic radiation therapy: Secondary | ICD-10-CM | POA: Diagnosis not present

## 2022-04-21 LAB — RAD ONC ARIA SESSION SUMMARY
Course Elapsed Days: 1
Plan Fractions Treated to Date: 2
Plan Prescribed Dose Per Fraction: 1.8 Gy
Plan Total Fractions Prescribed: 28
Plan Total Prescribed Dose: 50.4 Gy
Reference Point Dosage Given to Date: 3.6 Gy
Reference Point Session Dosage Given: 1.8 Gy
Session Number: 2

## 2022-04-22 ENCOUNTER — Other Ambulatory Visit: Payer: Self-pay

## 2022-04-22 ENCOUNTER — Encounter: Payer: Self-pay | Admitting: *Deleted

## 2022-04-22 ENCOUNTER — Ambulatory Visit
Admission: RE | Admit: 2022-04-22 | Discharge: 2022-04-22 | Disposition: A | Discharge status: Home / Self Care | Payer: Medicare HMO | Source: Ambulatory Visit | Attending: Radiation Oncology | Admitting: Radiation Oncology

## 2022-04-22 ENCOUNTER — Inpatient Hospital Stay: Payer: Medicare HMO

## 2022-04-22 DIAGNOSIS — Z510 Encounter for antineoplastic radiation therapy: Secondary | ICD-10-CM | POA: Diagnosis not present

## 2022-04-22 LAB — RAD ONC ARIA SESSION SUMMARY
Course Elapsed Days: 2
Plan Fractions Treated to Date: 3
Plan Prescribed Dose Per Fraction: 1.8 Gy
Plan Total Fractions Prescribed: 28
Plan Total Prescribed Dose: 50.4 Gy
Reference Point Dosage Given to Date: 5.4 Gy
Reference Point Session Dosage Given: 1.8 Gy
Session Number: 3

## 2022-04-25 ENCOUNTER — Ambulatory Visit
Admission: RE | Admit: 2022-04-25 | Discharge: 2022-04-25 | Disposition: A | Discharge status: Home / Self Care | Payer: Medicare HMO | Source: Ambulatory Visit | Attending: Radiation Oncology | Admitting: Radiation Oncology

## 2022-04-25 ENCOUNTER — Encounter: Payer: Self-pay | Admitting: *Deleted

## 2022-04-25 ENCOUNTER — Inpatient Hospital Stay: Payer: Medicare HMO

## 2022-04-25 ENCOUNTER — Other Ambulatory Visit: Payer: Self-pay

## 2022-04-25 DIAGNOSIS — Z510 Encounter for antineoplastic radiation therapy: Secondary | ICD-10-CM | POA: Diagnosis not present

## 2022-04-25 LAB — RAD ONC ARIA SESSION SUMMARY
Course Elapsed Days: 5
Plan Fractions Treated to Date: 4
Plan Prescribed Dose Per Fraction: 1.8 Gy
Plan Total Fractions Prescribed: 28
Plan Total Prescribed Dose: 50.4 Gy
Reference Point Dosage Given to Date: 7.2 Gy
Reference Point Session Dosage Given: 1.8 Gy
Session Number: 4

## 2022-04-26 ENCOUNTER — Inpatient Hospital Stay (HOSPITAL_BASED_OUTPATIENT_CLINIC_OR_DEPARTMENT_OTHER): Payer: Medicare HMO | Admitting: Oncology

## 2022-04-26 ENCOUNTER — Inpatient Hospital Stay: Payer: Medicare HMO

## 2022-04-26 ENCOUNTER — Ambulatory Visit
Admission: RE | Admit: 2022-04-26 | Discharge: 2022-04-26 | Disposition: A | Discharge status: Home / Self Care | Payer: Medicare HMO | Source: Ambulatory Visit | Attending: Radiation Oncology | Admitting: Radiation Oncology

## 2022-04-26 ENCOUNTER — Other Ambulatory Visit: Payer: Self-pay

## 2022-04-26 ENCOUNTER — Encounter: Payer: Self-pay | Admitting: Oncology

## 2022-04-26 VITALS — BP 105/69 | HR 81 | Temp 98.3°F | Resp 14 | Wt 89.5 lb

## 2022-04-26 DIAGNOSIS — Z5112 Encounter for antineoplastic immunotherapy: Secondary | ICD-10-CM | POA: Diagnosis not present

## 2022-04-26 DIAGNOSIS — C50411 Malignant neoplasm of upper-outer quadrant of right female breast: Secondary | ICD-10-CM | POA: Diagnosis not present

## 2022-04-26 DIAGNOSIS — Z171 Estrogen receptor negative status [ER-]: Secondary | ICD-10-CM

## 2022-04-26 DIAGNOSIS — Z510 Encounter for antineoplastic radiation therapy: Secondary | ICD-10-CM | POA: Diagnosis not present

## 2022-04-26 LAB — CBC WITH DIFFERENTIAL/PLATELET
Abs Immature Granulocytes: 0.09 10*3/uL — ABNORMAL HIGH (ref 0.00–0.07)
Basophils Absolute: 0.1 10*3/uL (ref 0.0–0.1)
Basophils Relative: 0 %
Eosinophils Absolute: 0.3 10*3/uL (ref 0.0–0.5)
Eosinophils Relative: 2 %
HCT: 41.4 % (ref 36.0–46.0)
Hemoglobin: 13.3 g/dL (ref 12.0–15.0)
Immature Granulocytes: 1 %
Lymphocytes Relative: 8 %
Lymphs Abs: 1 10*3/uL (ref 0.7–4.0)
MCH: 32.5 pg (ref 26.0–34.0)
MCHC: 32.1 g/dL (ref 30.0–36.0)
MCV: 101.2 fL — ABNORMAL HIGH (ref 80.0–100.0)
Monocytes Absolute: 0.8 10*3/uL (ref 0.1–1.0)
Monocytes Relative: 7 %
Neutro Abs: 10 10*3/uL — ABNORMAL HIGH (ref 1.7–7.7)
Neutrophils Relative %: 82 %
Platelets: 242 10*3/uL (ref 150–400)
RBC: 4.09 MIL/uL (ref 3.87–5.11)
RDW: 15.1 % (ref 11.5–15.5)
WBC: 12.2 10*3/uL — ABNORMAL HIGH (ref 4.0–10.5)
nRBC: 0 % (ref 0.0–0.2)

## 2022-04-26 LAB — COMPREHENSIVE METABOLIC PANEL
ALT: 10 U/L (ref 0–44)
AST: 19 U/L (ref 15–41)
Albumin: 3.4 g/dL — ABNORMAL LOW (ref 3.5–5.0)
Alkaline Phosphatase: 87 U/L (ref 38–126)
Anion gap: 6 (ref 5–15)
BUN: 18 mg/dL (ref 8–23)
CO2: 29 mmol/L (ref 22–32)
Calcium: 9.9 mg/dL (ref 8.9–10.3)
Chloride: 104 mmol/L (ref 98–111)
Creatinine, Ser: 0.52 mg/dL (ref 0.44–1.00)
GFR, Estimated: >60 mL/min (ref >60)
Glucose, Bld: 89 mg/dL (ref 70–99)
Potassium: 4.6 mmol/L (ref 3.5–5.1)
Sodium: 139 mmol/L (ref 135–145)
Total Bilirubin: 0.8 mg/dL (ref 0.3–1.2)
Total Protein: 7 g/dL (ref 6.5–8.1)

## 2022-04-26 LAB — RAD ONC ARIA SESSION SUMMARY
Course Elapsed Days: 6
Plan Fractions Treated to Date: 5
Plan Prescribed Dose Per Fraction: 1.8 Gy
Plan Total Fractions Prescribed: 28
Plan Total Prescribed Dose: 50.4 Gy
Reference Point Dosage Given to Date: 9 Gy
Reference Point Session Dosage Given: 0.6933 Gy
Session Number: 5

## 2022-04-26 LAB — TSH: TSH: 1.8 u[IU]/mL (ref 0.350–4.500)

## 2022-04-26 MED ORDER — SODIUM CHLORIDE 0.9% FLUSH
10.0000 mL | Freq: Once | INTRAVENOUS | Status: AC
  Administered 2022-04-26: 10 mL via INTRAVENOUS
  Filled 2022-04-26: qty 10

## 2022-04-26 MED ORDER — HEPARIN SOD (PORK) LOCK FLUSH 100 UNIT/ML IV SOLN
INTRAVENOUS | Status: AC
  Administered 2022-04-26: 500 [IU] via INTRAVENOUS
  Filled 2022-04-26: qty 5

## 2022-04-26 MED ORDER — HEPARIN SOD (PORK) LOCK FLUSH 100 UNIT/ML IV SOLN
500.0000 [IU] | Freq: Once | INTRAVENOUS | Status: DC | PRN
  Filled 2022-04-26: qty 5

## 2022-04-26 MED ORDER — SODIUM CHLORIDE 0.9 % IV SOLN
Freq: Once | INTRAVENOUS | Status: AC
  Filled 2022-04-26: qty 250

## 2022-04-26 MED ORDER — HEPARIN SOD (PORK) LOCK FLUSH 100 UNIT/ML IV SOLN
500.0000 [IU] | Freq: Once | INTRAVENOUS | Status: AC
  Filled 2022-04-26: qty 5

## 2022-04-26 MED ORDER — SODIUM CHLORIDE 0.9 % IV SOLN
200.0000 mg | Freq: Once | INTRAVENOUS | Status: AC
  Administered 2022-04-26: 200 mg via INTRAVENOUS
  Filled 2022-04-26: qty 8

## 2022-04-26 NOTE — Progress Notes (Signed)
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  That incidentally showed right axillary adenopathy and soft tissue in the superior right breast.  This was followed by diagnostic right breast mammogram and ultrasound which showed a 2.3 x 1.6 x 1.2 cm irregular hypoechoic mass in the right breast at the 12 o'clock position 1 cm from the nipple.  Enlarged right axillary lymph node 1.8 cm.  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  Patient has oxygen dependent COPD.     PET CT scan shows FDG avid spiculated nodule with central cavitation in the left upper lobe.  Size an SUV of this nodule was not quantified on the PET scan.  Partially calcified markedly avid right breast lesion with markedly avid right axillary lymph node metastases.  Patient underwent 1 cycle of Taxol chemotherapy while awaiting surgery and underwent final surgery on 12/28/2021.  Final pathology showed18 mm grade 3 invasive mammary carcinoma triple negative. Macrometastases and at least for an micrometastases in 0 lymph nodes.  Largest size of metastatic deposit 30 mm.  Extranodal extension present.  pT1C pN2A   Given age and frailty plan is to proceed with  CarboTaxol chemotherapy alone for 12 cycles.  Beryle Flock was added with cycle 12 of CarboTaxol and plan is to continue 9 cycles of adjuvant Keytruda  Interval history-she is doing well with her treatment so far.  Denies any specific complaints other than chronic fatigue and exertional shortness of breath  ECOG PS- 2 Pain scale- 0   Review of systems- Review of Systems  Constitutional:  Positive for malaise/fatigue. Negative for chills, fever and weight loss.  HENT:  Negative for congestion, ear discharge and nosebleeds.   Eyes:  Negative for blurred vision.  Respiratory:  Positive for shortness of breath. Negative for cough, hemoptysis, sputum production and wheezing.   Cardiovascular:  Negative for chest pain, palpitations, orthopnea and claudication.  Gastrointestinal:  Negative for abdominal pain, blood in stool, constipation, diarrhea, heartburn, melena, nausea and vomiting.  Genitourinary:  Negative for dysuria, flank pain, frequency, hematuria and urgency.  Musculoskeletal:  Negative for back pain, joint pain and myalgias.  Skin:  Negative for rash.  Neurological:  Negative for dizziness, tingling, focal weakness, seizures, weakness and headaches.  Endo/Heme/Allergies:  Does not bruise/bleed easily.  Psychiatric/Behavioral:  Negative for depression and suicidal ideas. The patient does not have insomnia.       Allergies  Allergen Reactions   Penicillins Rash and Other (See Comments)     Past Medical History:  Diagnosis Date   Arthritis    Cancer ([place])    CHF (congestive heart failure) (HCC)    COPD (chronic obstructive pulmonary disease) (HCC)    Dementia ([place])    Hyperlipidemia      Past Surgical History:  Procedure Laterality Date  BREAST BIOPSY Right 10/14/2021   rt 12:00 [place] bx venus clip path pending   BREAST BIOPSY Right 10/14/2021   rt axilla hydromarker path pending   EYE SURGERY Bilateral 2019   cataract   MASTECTOMY MODIFIED RADICAL Right 12/28/2021    Procedure: MASTECTOMY MODIFIED RADICAL;  Surgeon: Herbert Pun, MD;  Location: [facility] ORS;  Service: General;  Laterality: Right;   PORTACATH PLACEMENT N/A 11/25/2021   Procedure: INSERTION PORT-A-CATH;  Surgeon: Herbert Pun, MD;  Location: [facility] ORS;  Service: General;  Laterality: N/A;    Social History   Socioeconomic History   Marital status: Divorced    Spouse name: Not on file   Number of children: Not on file   Years of education: Not on file   Highest education level: Not on file  Occupational History   Not on file  Tobacco Use   Smoking status: Former    Types: Cigarettes    Quit date: 10/2019    Years since quitting: 2.4    Passive exposure: Past   Smokeless tobacco: Never  Vaping Use   Vaping Use: Never used  Substance and Sexual Activity   Alcohol use: Not Currently   Drug use: Not Currently   Sexual activity: Not Currently  Other Topics Concern   Not on file  Social History Narrative   Not on file   Social Determinants of Health   Financial Resource Strain: Not on file  Food Insecurity: Not on file  Transportation Needs: Unmet Transportation Needs (04/25/2022)   PRAPARE - Hydrologist (Medical): Yes    Lack of Transportation (Non-Medical): Yes  Physical Activity: Not on file  Stress: Not on file  Social Connections: Not on file  Intimate Partner Violence: Not on file    Family History  Problem Relation Age of Onset   Breast cancer Mother    Lung cancer Sister      Current Outpatient Medications:    acetaminophen (TYLENOL) 325 MG tablet, Take 650 mg by mouth See admin instructions. Take 2 tablets (650 mg) by mouth 3 times daily scheduled (0800, 1400 & 2000) & take 2 tablets (650 mg) by mouth up to twice daily as needed for pain., Disp: , Rfl:    albuterol (VENTOLIN HFA) 108 (90 Base) MCG/ACT inhaler, Inhale 2 puffs into the lungs every 6 (six) hours as needed for shortness of breath or wheezing., Disp: ,  Rfl:    Albuterol Sulfate 2.5 MG/0.5ML NEBU, Inhale into the lungs as needed., Disp: , Rfl:    alendronate (FOSAMAX) 70 MG tablet, Take 70 mg by mouth every Monday., Disp: , Rfl:    ammonium lactate (LAC-HYDRIN) 12 % lotion, Apply 1 application. topically daily. (0800) Lower legs., Disp: , Rfl:    atorvastatin (LIPITOR) 20 MG tablet, Take 20 mg by mouth daily. (0800), Disp: , Rfl:    budesonide (PULMICORT) 0.5 MG/2ML nebulizer solution, Inhale 0.5 mg into the lungs daily. (0800), Disp: , Rfl:    calcium carbonate (TUMS EX) 750 MG chewable tablet, Chew 1 tablet by mouth in the morning and at bedtime. (0800 & 2000), Disp: , Rfl:    Cholecalciferol (VITAMIN D3) 50 MCG (2000 UT) TABS, Take 2,000 Units by mouth daily. (0800), Disp: , Rfl:    INCRUSE ELLIPTA 62.5 MCG/ACT AEPB, Inhale 1 puff into the lungs daily. (0800), Disp: , Rfl:    LUMIGAN 0.01 % SOLN, Place 1 drop into both eyes at bedtime. (2000), Disp: , Rfl:  metoprolol succinate (TOPROL-XL) 25 MG 24 hr tablet, Take 25 mg by mouth daily. (0800) Hold if SBP<100 or Pulse <60., Disp: , Rfl:    Multiple Vitamins-Minerals (HEALTHY EYES SUPERVISION 2 PO), Take 1 capsule by mouth in the morning and at bedtime. (0730 & 1730), Disp: , Rfl:    OXYGEN, Inhale 2 L/min into the lungs as needed (shortness of breath OR if pulse ox is <90% on room air)., Disp: , Rfl:    potassium chloride SA (KLOR-CON M) 20 MEQ tablet, Take 20 mEq by mouth daily. (0800), Disp: , Rfl:    traMADol (ULTRAM) 50 MG tablet, Take 1 tablet (50 mg total) by mouth every 6 (six) hours as needed., Disp: 20 tablet, Rfl: 0   guaiFENesin (MUCINEX) 600 MG 12 hr tablet, Take 600 mg by mouth 2 (two) times daily as needed (congestion.). (Patient not taking: Reported on 03/07/2022), Disp: , Rfl:  No current facility-administered medications for this visit.  Facility-Administered Medications Ordered in Other Visits:    diphenhydrAMINE (BENADRYL) 50 MG/ML injection, , , ,    famotidine (PEPCID)  20-0.9 MG/50ML-% IVPB, , , ,    heparin lock flush 100 unit/mL, 500 Units, Intracatheter, Once PRN, Sindy Guadeloupe, MD  Physical exam:  Vitals:   04/26/22 0943  BP: 105/69  Pulse: 81  Resp: 14  Temp: 98.3 F (36.8 C)  SpO2: 95%  Weight: 89 lb 8 oz (40.6 kg)   Physical Exam Cardiovascular:     Rate and Rhythm: Normal rate and regular rhythm.     Heart sounds: Normal heart sounds.  Pulmonary:     Effort: Pulmonary effort is normal.     Breath sounds: Normal breath sounds.  Abdominal:     General: Bowel sounds are normal.     Palpations: Abdomen is soft.  Skin:    General: Skin is warm and dry.  Neurological:     Mental Status: She is alert and oriented to person, place, and time.         Latest Ref Rng & Units 04/26/2022    9:02 AM  CMP  Glucose 70 - 99 mg/dL 89   BUN 8 - 23 mg/dL 18   Creatinine 0.44 - 1.00 mg/dL 0.52   Sodium 135 - 145 mmol/L 139   Potassium 3.5 - 5.1 mmol/L 4.6   Chloride 98 - 111 mmol/L 104   CO2 22 - 32 mmol/L 29   Calcium 8.9 - 10.3 mg/dL 9.9   Total Protein 6.5 - 8.1 g/dL 7.0   Total Bilirubin 0.3 - 1.2 mg/dL 0.8   Alkaline Phos 38 - 126 U/L 87   AST 15 - 41 U/L 19   ALT 0 - 44 U/L 10       Latest Ref Rng & Units 04/26/2022    9:02 AM  CBC  WBC 4.0 - 10.5 K/uL 12.2   Hemoglobin 12.0 - 15.0 g/dL 13.3   Hematocrit 36.0 - 46.0 % 41.4   Platelets 150 - 400 K/uL 242     Assessment and plan- Patient is a 76 y.o. female with clinical prognostic stage IIIb invasive apocrine  carcinoma of the right breast apocrine variety T2 N1 M0 triple negative.  He has completed 12 weekly cycles of CarboTaxol chemotherapy and here for on treatment assessment prior to cycle 3 of adjuvant Keytruda  Counts okay to proceed with adjuvant Keytruda cycle 3 today.  I will see her back in 3 weeks for cycle 4.  She is currently  undergoing adjuvant radiation therapy as well.  No evidence of radiation dermatitis.   Visit Diagnosis 1. Malignant neoplasm of upper-outer  quadrant of right breast in female, estrogen receptor negative ([place])   2. Encounter for antineoplastic immunotherapy      Dr. Randa Evens, MD, MPH [facility] [phone] 04/26/2022 1:15 PM

## 2022-04-26 NOTE — Patient Instructions (Signed)
[facility]  Discharge Instructions: Thank you for choosing [facility] to provide your oncology and hematology care.  If you have a lab appointment with the Cancer Center, please go directly to the Cancer Center and check in at the registration area.  Wear comfortable clothing and clothing appropriate for easy access to any Portacath or PICC line.   We strive to give you quality time with your provider. You may need to reschedule your appointment if you arrive late (15 or more minutes).  Arriving late affects you and other patients whose appointments are after yours.  Also, if you miss three or more appointments without notifying the office, you may be dismissed from the clinic at the provider's discretion.      For prescription refill requests, have your pharmacy contact our office and allow 72 hours for refills to be completed.    Today you received the following chemotherapy and/or immunotherapy agents Keytruda      To help prevent nausea and vomiting after your treatment, we encourage you to take your nausea medication as directed.  BELOW ARE SYMPTOMS THAT SHOULD BE REPORTED IMMEDIATELY: *FEVER GREATER THAN 100.4 F (38 C) OR HIGHER *CHILLS OR SWEATING *NAUSEA AND VOMITING THAT IS NOT CONTROLLED WITH YOUR NAUSEA MEDICATION *UNUSUAL SHORTNESS OF BREATH *UNUSUAL BRUISING OR BLEEDING *URINARY PROBLEMS (pain or burning when urinating, or frequent urination) *BOWEL PROBLEMS (unusual diarrhea, constipation, pain near the anus) TENDERNESS IN MOUTH AND THROAT WITH OR WITHOUT PRESENCE OF ULCERS (sore throat, sores in mouth, or a toothache) UNUSUAL RASH, SWELLING OR PAIN  UNUSUAL VAGINAL DISCHARGE OR ITCHING   Items with * indicate a potential emergency and should be followed up as soon as possible or go to the Emergency Department if any problems should occur.  Please show the CHEMOTHERAPY ALERT CARD or IMMUNOTHERAPY ALERT CARD at check-in to  the Emergency Department and triage nurse.  Should you have questions after your visit or need to cancel or reschedule your appointment, please contact [facility] [place]  [phone] and follow the prompts.  Office hours are 8:00 a.m. to 4:30 p.m. Monday - Friday. Please note that voicemails left after 4:00 p.m. may not be returned until the following business day.  We are closed weekends and major holidays. You have access to a nurse at all times for urgent questions. Please call the main number to the clinic [phone] and follow the prompts.  For any non-urgent questions, you may also contact your provider using MyChart. We now offer e-Visits for anyone 18 and older to request care online for non-urgent symptoms. For details visit [email or web address].   Also download the MyChart app! Go to the app store, search "MyChart", open the app, select [facility], and log in with your MyChart username and password.  Masks are optional in the cancer centers. If you would like for your care team to wear a mask while they are taking care of you, please let them know. For doctor visits, patients may have with them one support person who is at least 76 years old. At this time, visitors are not allowed in the infusion area.   

## 2022-04-27 ENCOUNTER — Inpatient Hospital Stay: Payer: Medicare HMO

## 2022-04-27 ENCOUNTER — Encounter: Payer: Self-pay | Admitting: *Deleted

## 2022-04-27 ENCOUNTER — Ambulatory Visit
Admission: RE | Admit: 2022-04-27 | Discharge: 2022-04-27 | Disposition: A | Discharge status: Home / Self Care | Payer: Medicare HMO | Source: Ambulatory Visit | Attending: Radiation Oncology | Admitting: Radiation Oncology

## 2022-04-27 ENCOUNTER — Other Ambulatory Visit: Payer: Self-pay

## 2022-04-27 DIAGNOSIS — Z510 Encounter for antineoplastic radiation therapy: Secondary | ICD-10-CM | POA: Diagnosis not present

## 2022-04-27 LAB — RAD ONC ARIA SESSION SUMMARY
Course Elapsed Days: 7
Plan Fractions Treated to Date: 6
Plan Prescribed Dose Per Fraction: 1.8 Gy
Plan Total Fractions Prescribed: 28
Plan Total Prescribed Dose: 50.4 Gy
Reference Point Dosage Given to Date: 10.8 Gy
Reference Point Session Dosage Given: 1.8 Gy
Session Number: 6

## 2022-04-27 LAB — T4: T4, Total: 5.7 ug/dL (ref 4.5–12.0)

## 2022-04-28 ENCOUNTER — Other Ambulatory Visit: Payer: Self-pay

## 2022-04-28 ENCOUNTER — Encounter: Payer: Self-pay | Admitting: *Deleted

## 2022-04-28 ENCOUNTER — Inpatient Hospital Stay: Payer: Medicare HMO

## 2022-04-28 ENCOUNTER — Ambulatory Visit
Admission: RE | Admit: 2022-04-28 | Discharge: 2022-04-28 | Disposition: A | Discharge status: Home / Self Care | Payer: Medicare HMO | Source: Ambulatory Visit | Attending: Radiation Oncology | Admitting: Radiation Oncology

## 2022-04-28 DIAGNOSIS — Z510 Encounter for antineoplastic radiation therapy: Secondary | ICD-10-CM | POA: Diagnosis not present

## 2022-04-28 LAB — RAD ONC ARIA SESSION SUMMARY
Course Elapsed Days: 8
Plan Fractions Treated to Date: 7
Plan Prescribed Dose Per Fraction: 1.8 Gy
Plan Total Fractions Prescribed: 28
Plan Total Prescribed Dose: 50.4 Gy
Reference Point Dosage Given to Date: 12.6 Gy
Reference Point Session Dosage Given: 1.8 Gy
Session Number: 7

## 2022-04-29 ENCOUNTER — Encounter: Payer: Self-pay | Admitting: *Deleted

## 2022-04-29 ENCOUNTER — Inpatient Hospital Stay: Payer: Medicare HMO

## 2022-04-29 ENCOUNTER — Other Ambulatory Visit: Payer: Self-pay

## 2022-04-29 ENCOUNTER — Ambulatory Visit
Admission: RE | Admit: 2022-04-29 | Discharge: 2022-04-29 | Disposition: A | Discharge status: Home / Self Care | Payer: Medicare HMO | Source: Ambulatory Visit | Attending: Radiation Oncology | Admitting: Radiation Oncology

## 2022-04-29 DIAGNOSIS — Z510 Encounter for antineoplastic radiation therapy: Secondary | ICD-10-CM | POA: Diagnosis not present

## 2022-04-29 LAB — RAD ONC ARIA SESSION SUMMARY
Course Elapsed Days: 9
Plan Fractions Treated to Date: 8
Plan Prescribed Dose Per Fraction: 1.8 Gy
Plan Total Fractions Prescribed: 28
Plan Total Prescribed Dose: 50.4 Gy
Reference Point Dosage Given to Date: 14.4 Gy
Reference Point Session Dosage Given: 1.8 Gy
Session Number: 8

## 2022-05-02 ENCOUNTER — Other Ambulatory Visit: Payer: Self-pay

## 2022-05-02 ENCOUNTER — Inpatient Hospital Stay: Payer: Medicare HMO | Attending: Oncology

## 2022-05-02 ENCOUNTER — Encounter: Payer: Self-pay | Admitting: *Deleted

## 2022-05-02 ENCOUNTER — Ambulatory Visit
Admission: RE | Admit: 2022-05-02 | Discharge: 2022-05-02 | Disposition: A | Discharge status: Home / Self Care | Payer: Medicare HMO | Source: Ambulatory Visit | Attending: Radiation Oncology | Admitting: Radiation Oncology

## 2022-05-02 DIAGNOSIS — C773 Secondary and unspecified malignant neoplasm of axilla and upper limb lymph nodes: Secondary | ICD-10-CM | POA: Insufficient documentation

## 2022-05-02 DIAGNOSIS — Z510 Encounter for antineoplastic radiation therapy: Secondary | ICD-10-CM | POA: Insufficient documentation

## 2022-05-02 DIAGNOSIS — R222 Localized swelling, mass and lump, trunk: Secondary | ICD-10-CM | POA: Insufficient documentation

## 2022-05-02 DIAGNOSIS — Z9221 Personal history of antineoplastic chemotherapy: Secondary | ICD-10-CM | POA: Insufficient documentation

## 2022-05-02 DIAGNOSIS — Z171 Estrogen receptor negative status [ER-]: Secondary | ICD-10-CM | POA: Insufficient documentation

## 2022-05-02 DIAGNOSIS — C50411 Malignant neoplasm of upper-outer quadrant of right female breast: Secondary | ICD-10-CM | POA: Insufficient documentation

## 2022-05-02 DIAGNOSIS — L598 Other specified disorders of the skin and subcutaneous tissue related to radiation: Secondary | ICD-10-CM | POA: Insufficient documentation

## 2022-05-02 DIAGNOSIS — Z79899 Other long term (current) drug therapy: Secondary | ICD-10-CM | POA: Insufficient documentation

## 2022-05-02 DIAGNOSIS — Z5112 Encounter for antineoplastic immunotherapy: Secondary | ICD-10-CM | POA: Insufficient documentation

## 2022-05-02 LAB — RAD ONC ARIA SESSION SUMMARY
Course Elapsed Days: 12
Plan Fractions Treated to Date: 9
Plan Prescribed Dose Per Fraction: 1.8 Gy
Plan Total Fractions Prescribed: 28
Plan Total Prescribed Dose: 50.4 Gy
Reference Point Dosage Given to Date: 16.2 Gy
Reference Point Session Dosage Given: 1.8 Gy
Session Number: 9

## 2022-05-03 ENCOUNTER — Inpatient Hospital Stay: Payer: Medicare HMO

## 2022-05-03 ENCOUNTER — Ambulatory Visit
Admission: RE | Admit: 2022-05-03 | Discharge: 2022-05-03 | Disposition: A | Discharge status: Home / Self Care | Payer: Medicare HMO | Source: Ambulatory Visit | Attending: Radiation Oncology | Admitting: Radiation Oncology

## 2022-05-03 ENCOUNTER — Other Ambulatory Visit: Payer: Self-pay

## 2022-05-03 ENCOUNTER — Encounter: Payer: Self-pay | Admitting: *Deleted

## 2022-05-03 DIAGNOSIS — Z510 Encounter for antineoplastic radiation therapy: Secondary | ICD-10-CM | POA: Diagnosis not present

## 2022-05-03 LAB — RAD ONC ARIA SESSION SUMMARY
Course Elapsed Days: 13
Plan Fractions Treated to Date: 10
Plan Prescribed Dose Per Fraction: 1.8 Gy
Plan Total Fractions Prescribed: 28
Plan Total Prescribed Dose: 50.4 Gy
Reference Point Dosage Given to Date: 18 Gy
Reference Point Session Dosage Given: 1.8 Gy
Session Number: 10

## 2022-05-04 ENCOUNTER — Other Ambulatory Visit: Payer: Self-pay

## 2022-05-04 ENCOUNTER — Ambulatory Visit
Admission: RE | Admit: 2022-05-04 | Discharge: 2022-05-04 | Disposition: A | Discharge status: Home / Self Care | Payer: Medicare HMO | Source: Ambulatory Visit | Attending: Radiation Oncology | Admitting: Radiation Oncology

## 2022-05-04 ENCOUNTER — Inpatient Hospital Stay: Payer: Medicare HMO

## 2022-05-04 ENCOUNTER — Encounter: Payer: Self-pay | Admitting: *Deleted

## 2022-05-04 ENCOUNTER — Inpatient Hospital Stay: Payer: Medicare HMO | Admitting: Occupational Therapy

## 2022-05-04 DIAGNOSIS — Z510 Encounter for antineoplastic radiation therapy: Secondary | ICD-10-CM | POA: Diagnosis not present

## 2022-05-04 LAB — RAD ONC ARIA SESSION SUMMARY
Course Elapsed Days: 14
Plan Fractions Treated to Date: 11
Plan Prescribed Dose Per Fraction: 1.8 Gy
Plan Total Fractions Prescribed: 28
Plan Total Prescribed Dose: 50.4 Gy
Reference Point Dosage Given to Date: 19.8 Gy
Reference Point Session Dosage Given: 1.8 Gy
Session Number: 11

## 2022-05-05 ENCOUNTER — Encounter: Payer: Self-pay | Admitting: *Deleted

## 2022-05-05 ENCOUNTER — Other Ambulatory Visit: Payer: Self-pay

## 2022-05-05 ENCOUNTER — Inpatient Hospital Stay: Payer: Medicare HMO

## 2022-05-05 ENCOUNTER — Ambulatory Visit
Admission: RE | Admit: 2022-05-05 | Discharge: 2022-05-05 | Disposition: A | Discharge status: Home / Self Care | Payer: Medicare HMO | Source: Ambulatory Visit | Attending: Radiation Oncology | Admitting: Radiation Oncology

## 2022-05-05 DIAGNOSIS — Z510 Encounter for antineoplastic radiation therapy: Secondary | ICD-10-CM | POA: Diagnosis not present

## 2022-05-05 LAB — RAD ONC ARIA SESSION SUMMARY
Course Elapsed Days: 15
Plan Fractions Treated to Date: 12
Plan Prescribed Dose Per Fraction: 1.8 Gy
Plan Total Fractions Prescribed: 28
Plan Total Prescribed Dose: 50.4 Gy
Reference Point Dosage Given to Date: 21.6 Gy
Reference Point Session Dosage Given: 1.8 Gy
Session Number: 12

## 2022-05-06 ENCOUNTER — Ambulatory Visit
Admission: RE | Admit: 2022-05-06 | Discharge: 2022-05-06 | Disposition: A | Discharge status: Home / Self Care | Payer: Medicare HMO | Source: Ambulatory Visit | Attending: Radiation Oncology | Admitting: Radiation Oncology

## 2022-05-06 ENCOUNTER — Other Ambulatory Visit: Payer: Self-pay

## 2022-05-06 ENCOUNTER — Inpatient Hospital Stay: Payer: Medicare HMO

## 2022-05-06 DIAGNOSIS — Z510 Encounter for antineoplastic radiation therapy: Secondary | ICD-10-CM | POA: Diagnosis not present

## 2022-05-06 LAB — RAD ONC ARIA SESSION SUMMARY
Course Elapsed Days: 16
Plan Fractions Treated to Date: 13
Plan Prescribed Dose Per Fraction: 1.8 Gy
Plan Total Fractions Prescribed: 28
Plan Total Prescribed Dose: 50.4 Gy
Reference Point Dosage Given to Date: 23.4 Gy
Reference Point Session Dosage Given: 1.8 Gy
Session Number: 13

## 2022-05-09 ENCOUNTER — Encounter: Payer: Self-pay | Admitting: *Deleted

## 2022-05-09 ENCOUNTER — Other Ambulatory Visit: Payer: Self-pay | Admitting: General Surgery

## 2022-05-09 ENCOUNTER — Other Ambulatory Visit: Payer: Self-pay

## 2022-05-09 ENCOUNTER — Inpatient Hospital Stay: Payer: Medicare HMO

## 2022-05-09 ENCOUNTER — Ambulatory Visit
Admission: RE | Admit: 2022-05-09 | Discharge: 2022-05-09 | Disposition: A | Discharge status: Home / Self Care | Payer: Medicare HMO | Source: Ambulatory Visit | Attending: Radiation Oncology | Admitting: Radiation Oncology

## 2022-05-09 DIAGNOSIS — Z510 Encounter for antineoplastic radiation therapy: Secondary | ICD-10-CM | POA: Diagnosis not present

## 2022-05-09 LAB — RAD ONC ARIA SESSION SUMMARY
Course Elapsed Days: 19
Plan Fractions Treated to Date: 14
Plan Prescribed Dose Per Fraction: 1.8 Gy
Plan Total Fractions Prescribed: 28
Plan Total Prescribed Dose: 50.4 Gy
Reference Point Dosage Given to Date: 25.2 Gy
Reference Point Session Dosage Given: 1.8 Gy
Session Number: 14

## 2022-05-10 ENCOUNTER — Inpatient Hospital Stay: Payer: Medicare HMO

## 2022-05-10 ENCOUNTER — Encounter: Payer: Self-pay | Admitting: *Deleted

## 2022-05-10 ENCOUNTER — Ambulatory Visit
Admission: RE | Admit: 2022-05-10 | Discharge: 2022-05-10 | Disposition: A | Discharge status: Home / Self Care | Payer: Medicare HMO | Source: Ambulatory Visit | Attending: Radiation Oncology | Admitting: Radiation Oncology

## 2022-05-10 ENCOUNTER — Other Ambulatory Visit: Payer: Self-pay

## 2022-05-10 DIAGNOSIS — Z510 Encounter for antineoplastic radiation therapy: Secondary | ICD-10-CM | POA: Diagnosis not present

## 2022-05-10 LAB — RAD ONC ARIA SESSION SUMMARY
Course Elapsed Days: 20
Plan Fractions Treated to Date: 15
Plan Prescribed Dose Per Fraction: 1.8 Gy
Plan Total Fractions Prescribed: 28
Plan Total Prescribed Dose: 50.4 Gy
Reference Point Dosage Given to Date: 27 Gy
Reference Point Session Dosage Given: 1.8 Gy
Session Number: 15

## 2022-05-11 ENCOUNTER — Other Ambulatory Visit: Payer: Self-pay

## 2022-05-11 ENCOUNTER — Ambulatory Visit
Admission: RE | Admit: 2022-05-11 | Discharge: 2022-05-11 | Disposition: A | Discharge status: Home / Self Care | Payer: Medicare HMO | Source: Ambulatory Visit | Attending: Radiation Oncology | Admitting: Radiation Oncology

## 2022-05-11 ENCOUNTER — Inpatient Hospital Stay: Payer: Medicare HMO

## 2022-05-11 ENCOUNTER — Encounter: Payer: Self-pay | Admitting: *Deleted

## 2022-05-11 DIAGNOSIS — Z510 Encounter for antineoplastic radiation therapy: Secondary | ICD-10-CM | POA: Diagnosis not present

## 2022-05-11 LAB — RAD ONC ARIA SESSION SUMMARY
Course Elapsed Days: 21
Plan Fractions Treated to Date: 16
Plan Prescribed Dose Per Fraction: 1.8 Gy
Plan Total Fractions Prescribed: 28
Plan Total Prescribed Dose: 50.4 Gy
Reference Point Dosage Given to Date: 28.8 Gy
Reference Point Session Dosage Given: 1.8 Gy
Session Number: 16

## 2022-05-12 ENCOUNTER — Encounter: Payer: Self-pay | Admitting: *Deleted

## 2022-05-12 ENCOUNTER — Ambulatory Visit
Admission: RE | Admit: 2022-05-12 | Discharge: 2022-05-12 | Disposition: A | Discharge status: Home / Self Care | Payer: Medicare HMO | Source: Ambulatory Visit | Attending: Radiation Oncology | Admitting: Radiation Oncology

## 2022-05-12 ENCOUNTER — Inpatient Hospital Stay: Payer: Medicare HMO

## 2022-05-12 ENCOUNTER — Other Ambulatory Visit: Payer: Self-pay

## 2022-05-12 DIAGNOSIS — Z510 Encounter for antineoplastic radiation therapy: Secondary | ICD-10-CM | POA: Diagnosis not present

## 2022-05-12 LAB — RAD ONC ARIA SESSION SUMMARY
Course Elapsed Days: 22
Plan Fractions Treated to Date: 17
Plan Prescribed Dose Per Fraction: 1.8 Gy
Plan Total Fractions Prescribed: 28
Plan Total Prescribed Dose: 50.4 Gy
Reference Point Dosage Given to Date: 30.6 Gy
Reference Point Session Dosage Given: 1.8 Gy
Session Number: 17

## 2022-05-13 ENCOUNTER — Encounter: Payer: Self-pay | Admitting: *Deleted

## 2022-05-13 ENCOUNTER — Other Ambulatory Visit: Payer: Self-pay

## 2022-05-13 ENCOUNTER — Inpatient Hospital Stay: Payer: Medicare HMO

## 2022-05-13 ENCOUNTER — Ambulatory Visit
Admission: RE | Admit: 2022-05-13 | Discharge: 2022-05-13 | Disposition: A | Discharge status: Home / Self Care | Payer: Medicare HMO | Source: Ambulatory Visit | Attending: Radiation Oncology | Admitting: Radiation Oncology

## 2022-05-13 DIAGNOSIS — Z510 Encounter for antineoplastic radiation therapy: Secondary | ICD-10-CM | POA: Diagnosis not present

## 2022-05-13 LAB — RAD ONC ARIA SESSION SUMMARY
Course Elapsed Days: 23
Plan Fractions Treated to Date: 18
Plan Prescribed Dose Per Fraction: 1.8 Gy
Plan Total Fractions Prescribed: 28
Plan Total Prescribed Dose: 50.4 Gy
Reference Point Dosage Given to Date: 32.4 Gy
Reference Point Session Dosage Given: 1.8 Gy
Session Number: 18

## 2022-05-16 ENCOUNTER — Other Ambulatory Visit: Payer: Self-pay

## 2022-05-16 ENCOUNTER — Ambulatory Visit
Admission: RE | Admit: 2022-05-16 | Discharge: 2022-05-16 | Disposition: A | Discharge status: Home / Self Care | Payer: Medicare HMO | Source: Ambulatory Visit | Attending: Radiation Oncology | Admitting: Radiation Oncology

## 2022-05-16 ENCOUNTER — Inpatient Hospital Stay: Payer: Medicare HMO

## 2022-05-16 ENCOUNTER — Encounter: Payer: Self-pay | Admitting: *Deleted

## 2022-05-16 DIAGNOSIS — Z510 Encounter for antineoplastic radiation therapy: Secondary | ICD-10-CM | POA: Diagnosis not present

## 2022-05-16 LAB — RAD ONC ARIA SESSION SUMMARY
Course Elapsed Days: 26
Plan Fractions Treated to Date: 19
Plan Prescribed Dose Per Fraction: 1.8 Gy
Plan Total Fractions Prescribed: 28
Plan Total Prescribed Dose: 50.4 Gy
Reference Point Dosage Given to Date: 34.2 Gy
Reference Point Session Dosage Given: 1.8 Gy
Session Number: 19

## 2022-05-17 ENCOUNTER — Ambulatory Visit
Admission: RE | Admit: 2022-05-17 | Discharge: 2022-05-17 | Disposition: A | Discharge status: Home / Self Care | Payer: Medicare HMO | Source: Ambulatory Visit | Attending: Radiation Oncology | Admitting: Radiation Oncology

## 2022-05-17 ENCOUNTER — Inpatient Hospital Stay: Payer: Medicare HMO

## 2022-05-17 ENCOUNTER — Encounter: Payer: Self-pay | Admitting: Oncology

## 2022-05-17 ENCOUNTER — Inpatient Hospital Stay (HOSPITAL_BASED_OUTPATIENT_CLINIC_OR_DEPARTMENT_OTHER): Payer: Medicare HMO | Admitting: Oncology

## 2022-05-17 ENCOUNTER — Other Ambulatory Visit: Payer: Self-pay

## 2022-05-17 VITALS — BP 118/74 | HR 78 | Temp 97.3°F | Resp 14 | Wt 89.2 lb

## 2022-05-17 DIAGNOSIS — L598 Other specified disorders of the skin and subcutaneous tissue related to radiation: Secondary | ICD-10-CM | POA: Diagnosis not present

## 2022-05-17 DIAGNOSIS — R222 Localized swelling, mass and lump, trunk: Secondary | ICD-10-CM | POA: Diagnosis not present

## 2022-05-17 DIAGNOSIS — Z5112 Encounter for antineoplastic immunotherapy: Secondary | ICD-10-CM | POA: Diagnosis not present

## 2022-05-17 DIAGNOSIS — C773 Secondary and unspecified malignant neoplasm of axilla and upper limb lymph nodes: Secondary | ICD-10-CM | POA: Diagnosis present

## 2022-05-17 DIAGNOSIS — Z79899 Other long term (current) drug therapy: Secondary | ICD-10-CM | POA: Diagnosis not present

## 2022-05-17 DIAGNOSIS — Z510 Encounter for antineoplastic radiation therapy: Secondary | ICD-10-CM | POA: Diagnosis not present

## 2022-05-17 DIAGNOSIS — Z9221 Personal history of antineoplastic chemotherapy: Secondary | ICD-10-CM | POA: Diagnosis not present

## 2022-05-17 DIAGNOSIS — Z171 Estrogen receptor negative status [ER-]: Secondary | ICD-10-CM

## 2022-05-17 DIAGNOSIS — C50411 Malignant neoplasm of upper-outer quadrant of right female breast: Secondary | ICD-10-CM

## 2022-05-17 LAB — CBC WITH DIFFERENTIAL/PLATELET
Abs Immature Granulocytes: 0.09 10*3/uL — ABNORMAL HIGH (ref 0.00–0.07)
Basophils Absolute: 0.1 10*3/uL (ref 0.0–0.1)
Basophils Relative: 1 %
Eosinophils Absolute: 0.3 10*3/uL (ref 0.0–0.5)
Eosinophils Relative: 2 %
HCT: 42 % (ref 36.0–46.0)
Hemoglobin: 13.7 g/dL (ref 12.0–15.0)
Immature Granulocytes: 1 %
Lymphocytes Relative: 7 %
Lymphs Abs: 0.7 10*3/uL (ref 0.7–4.0)
MCH: 32.5 pg (ref 26.0–34.0)
MCHC: 32.6 g/dL (ref 30.0–36.0)
MCV: 99.8 fL (ref 80.0–100.0)
Monocytes Absolute: 1 10*3/uL (ref 0.1–1.0)
Monocytes Relative: 10 %
Neutro Abs: 8.6 10*3/uL — ABNORMAL HIGH (ref 1.7–7.7)
Neutrophils Relative %: 79 %
Platelets: 232 10*3/uL (ref 150–400)
RBC: 4.21 MIL/uL (ref 3.87–5.11)
RDW: 13.6 % (ref 11.5–15.5)
WBC: 10.8 10*3/uL — ABNORMAL HIGH (ref 4.0–10.5)
nRBC: 0 % (ref 0.0–0.2)

## 2022-05-17 LAB — RAD ONC ARIA SESSION SUMMARY
Course Elapsed Days: 27
Plan Fractions Treated to Date: 20
Plan Prescribed Dose Per Fraction: 1.8 Gy
Plan Total Fractions Prescribed: 28
Plan Total Prescribed Dose: 50.4 Gy
Reference Point Dosage Given to Date: 36 Gy
Reference Point Session Dosage Given: 1.8 Gy
Session Number: 20

## 2022-05-17 LAB — COMPREHENSIVE METABOLIC PANEL
ALT: 10 U/L (ref 0–44)
AST: 20 U/L (ref 15–41)
Albumin: 3.5 g/dL (ref 3.5–5.0)
Alkaline Phosphatase: 75 U/L (ref 38–126)
Anion gap: 7 (ref 5–15)
BUN: 15 mg/dL (ref 8–23)
CO2: 29 mmol/L (ref 22–32)
Calcium: 9.7 mg/dL (ref 8.9–10.3)
Chloride: 103 mmol/L (ref 98–111)
Creatinine, Ser: 0.59 mg/dL (ref 0.44–1.00)
GFR, Estimated: >60 mL/min (ref >60)
Glucose, Bld: 78 mg/dL (ref 70–99)
Potassium: 4.2 mmol/L (ref 3.5–5.1)
Sodium: 139 mmol/L (ref 135–145)
Total Bilirubin: 0.5 mg/dL (ref 0.3–1.2)
Total Protein: 6.9 g/dL (ref 6.5–8.1)

## 2022-05-17 MED ORDER — SODIUM CHLORIDE 0.9 % IV SOLN
Freq: Once | INTRAVENOUS | Status: AC
  Filled 2022-05-17: qty 250

## 2022-05-17 MED ORDER — HEPARIN SOD (PORK) LOCK FLUSH 100 UNIT/ML IV SOLN
INTRAVENOUS | Status: AC
  Filled 2022-05-17: qty 5

## 2022-05-17 MED ORDER — HEPARIN SOD (PORK) LOCK FLUSH 100 UNIT/ML IV SOLN
500.0000 [IU] | Freq: Once | INTRAVENOUS | Status: AC | PRN
  Administered 2022-05-17: 500 [IU]
  Filled 2022-05-17: qty 5

## 2022-05-17 MED ORDER — SODIUM CHLORIDE 0.9 % IV SOLN
200.0000 mg | Freq: Once | INTRAVENOUS | Status: AC
  Administered 2022-05-17: 200 mg via INTRAVENOUS
  Filled 2022-05-17: qty 8

## 2022-05-17 NOTE — Progress Notes (Signed)
[facility] geologist ordered a [place] to check for fluid underneth axilla but has not been scheduled yet.

## 2022-05-17 NOTE — Progress Notes (Signed)
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  That incidentally showed right axillary adenopathy and soft tissue in the superior right breast.  This was followed by diagnostic right breast mammogram and ultrasound which showed a 2.3 x 1.6 x 1.2 cm irregular hypoechoic mass in the right breast at the 12 o'clock position 1 cm from the nipple.  Enlarged right axillary lymph node 1.8 cm.  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  Patient has oxygen dependent COPD.     PET CT scan shows FDG avid spiculated nodule with central cavitation in the left upper lobe.  Size an SUV of this nodule was not quantified on the PET scan.  Partially calcified markedly avid right breast lesion with markedly avid right axillary lymph node metastases.  Patient underwent 1 cycle of Taxol chemotherapy while awaiting surgery and underwent final surgery on 12/28/2021.  Final pathology showed18 mm grade 3 invasive mammary carcinoma triple negative. Macrometastases and at least for an micrometastases in 0 lymph nodes.  Largest size of metastatic deposit 30 mm.  Extranodal extension present.  pT1C pN2A   Given age and frailty plan is to proceed with  CarboTaxol chemotherapy alone for 12 cycles.  Beryle Flock was added with cycle 12 of CarboTaxol and plan is to continue 9 cycles of adjuvant Keytruda  Interval history-patient is tolerating treatments well.  She has also another 2 weeks of radiation to go.  Reports some dryness over the right chest wall skin but no areas of ulceration.  Also noted to have swelling in her right axilla which was evaluated by Dr. Peyton Najjar and thought to be a possible seroma.  She is getting an ultrasound for the same.  ECOG PS- 2 Pain scale- 0   Review of systems- Review of Systems  Constitutional:  Positive for malaise/fatigue. Negative for chills, fever and weight loss.  HENT:  Negative for congestion, ear discharge and nosebleeds.   Eyes:  Negative for blurred vision.  Respiratory:  Negative for cough, hemoptysis, sputum production, shortness of breath and wheezing.   Cardiovascular:  Negative for chest pain, palpitations, orthopnea and claudication.  Gastrointestinal:  Negative for abdominal pain, blood in stool, constipation, diarrhea, heartburn, melena, nausea and vomiting.  Genitourinary:  Negative for dysuria, flank pain, frequency, hematuria and urgency.  Musculoskeletal:  Negative for back pain, joint pain and myalgias.  Skin:  Negative for rash.  Neurological:  Negative for dizziness, tingling, focal weakness, seizures, weakness and headaches.  Endo/Heme/Allergies:  Does not bruise/bleed easily.  Psychiatric/Behavioral:  Negative for depression and suicidal ideas. The patient does not have insomnia.       Allergies  Allergen Reactions   Penicillins Rash and Other (See Comments)     Past Medical History:  Diagnosis Date   Arthritis    Cancer ([place])  CHF (congestive heart failure) (HCC)    COPD (chronic obstructive pulmonary disease) (HCC)    Dementia (HCC)    Hyperlipidemia      Past Surgical History:  Procedure Laterality Date   BREAST BIOPSY Right 10/14/2021   rt 12:00 [place] bx venus  clip path pending   BREAST BIOPSY Right 10/14/2021   rt axilla hydromarker path pending   EYE SURGERY Bilateral 2019   cataract   MASTECTOMY MODIFIED RADICAL Right 12/28/2021   Procedure: MASTECTOMY MODIFIED RADICAL;  Surgeon: Herbert Pun, MD;  Location: [facility] ORS;  Service: General;  Laterality: Right;   PORTACATH PLACEMENT N/A 11/25/2021   Procedure: INSERTION PORT-A-CATH;  Surgeon: Herbert Pun, MD;  Location: [facility] ORS;  Service: General;  Laterality: N/A;    Social History   Socioeconomic History   Marital status: Divorced    Spouse name: Not on file   Number of children: Not on file   Years of education: Not on file   Highest education level: Not on file  Occupational History   Not on file  Tobacco Use   Smoking status: Former    Types: Cigarettes    Quit date: 10/2019    Years since quitting: 2.5    Passive exposure: Past   Smokeless tobacco: Never  Vaping Use   Vaping Use: Never used  Substance and Sexual Activity   Alcohol use: Not Currently   Drug use: Not Currently   Sexual activity: Not Currently  Other Topics Concern   Not on file  Social History Narrative   Not on file   Social Determinants of Health   Financial Resource Strain: Not on file  Food Insecurity: Not on file  Transportation Needs: Unmet Transportation Needs (05/16/2022)   PRAPARE - Hydrologist (Medical): Yes    Lack of Transportation (Non-Medical): Yes  Physical Activity: Not on file  Stress: Not on file  Social Connections: Not on file  Intimate Partner Violence: Not on file    Family History  Problem Relation Age of Onset   Breast cancer Mother    Lung cancer Sister      Current Outpatient Medications:    acetaminophen (TYLENOL) 325 MG tablet, Take 650 mg by mouth See admin instructions. Take 2 tablets (650 mg) by mouth 3 times daily scheduled (0800, 1400 & 2000) & take 2 tablets (650 mg) by mouth up to twice daily as needed for pain.,  Disp: , Rfl:    albuterol (VENTOLIN HFA) 108 (90 Base) MCG/ACT inhaler, Inhale 2 puffs into the lungs every 6 (six) hours as needed for shortness of breath or wheezing., Disp: , Rfl:    Albuterol Sulfate 2.5 MG/0.5ML NEBU, Inhale into the lungs as needed., Disp: , Rfl:    alendronate (FOSAMAX) 70 MG tablet, Take 70 mg by mouth every Monday., Disp: , Rfl:    ammonium lactate (LAC-HYDRIN) 12 % lotion, Apply 1 application. topically daily. (0800) Lower legs., Disp: , Rfl:    aspirin EC 81 MG tablet, Take 81 mg by mouth daily. Swallow whole., Disp: , Rfl:    atorvastatin (LIPITOR) 20 MG tablet, Take 20 mg by mouth daily. (0800), Disp: , Rfl:    budesonide (PULMICORT) 0.5 MG/2ML nebulizer solution, Inhale 0.5 mg into the lungs daily. (0800), Disp: , Rfl:    calcium carbonate (TUMS EX) 750 MG chewable tablet, Chew 1 tablet by mouth in the morning and at bedtime. (0800 & 2000), Disp: , Rfl:    Cholecalciferol (VITAMIN D3)  50 MCG (2000 UT) TABS, Take 2,000 Units by mouth daily. (0800), Disp: , Rfl:    INCRUSE ELLIPTA 62.5 MCG/ACT AEPB, Inhale 1 puff into the lungs daily. (0800), Disp: , Rfl:    LUMIGAN 0.01 % SOLN, Place 1 drop into both eyes at bedtime. (2000), Disp: , Rfl:    metoprolol succinate (TOPROL-XL) 25 MG 24 hr tablet, Take 25 mg by mouth daily. (0800) Hold if SBP<100 or Pulse <60., Disp: , Rfl:    Multiple Vitamins-Minerals (HEALTHY EYES SUPERVISION 2 PO), Take 1 capsule by mouth in the morning and at bedtime. (0730 & 1730), Disp: , Rfl:    OXYGEN, Inhale 2 L/min into the lungs as needed (shortness of breath OR if pulse ox is <90% on room air)., Disp: , Rfl:    potassium chloride SA (KLOR-CON M) 20 MEQ tablet, Take 20 mEq by mouth daily. (0800), Disp: , Rfl:    traMADol (ULTRAM) 50 MG tablet, Take 1 tablet (50 mg total) by mouth every 6 (six) hours as needed., Disp: 20 tablet, Rfl: 0   guaiFENesin (MUCINEX) 600 MG 12 hr tablet, Take 600 mg by mouth 2 (two) times daily as needed (congestion.).  (Patient not taking: Reported on 03/07/2022), Disp: , Rfl:  No current facility-administered medications for this visit.  Facility-Administered Medications Ordered in Other Visits:    diphenhydrAMINE (BENADRYL) 50 MG/ML injection, , , ,    famotidine (PEPCID) 20-0.9 MG/50ML-% IVPB, , , ,    heparin lock flush 100 unit/mL, 500 Units, Intracatheter, Once PRN, Sindy Guadeloupe, MD   pembrolizumab [facility]) 200 mg in sodium chloride 0.9 % 50 mL chemo infusion, 200 mg, Intravenous, Once, Sindy Guadeloupe, MD, Last Rate: 116 mL/hr at 05/17/22 0942, 200 mg at 05/17/22 0942  Physical exam:  Vitals:   05/17/22 0852  BP: 118/74  Pulse: 78  Resp: 14  Temp: (!) 97.3 F (36.3 C)  SpO2: 96%  Weight: 89 lb 3.2 oz (40.5 kg)   Physical Exam Constitutional:      General: She is not in acute distress. Cardiovascular:     Rate and Rhythm: Normal rate and regular rhythm.     Heart sounds: Normal heart sounds.  Pulmonary:     Effort: Pulmonary effort is normal.     Breath sounds: Normal breath sounds.  Abdominal:     General: Bowel sounds are normal.     Palpations: Abdomen is soft.  Skin:    General: Skin is warm and dry.  Neurological:     Mental Status: She is alert and oriented to person, place, and time.   Chest wall exam: Patient is s/p right mastectomy without reconstruction.  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  There is a roughly 3 cm circular soft swelling noted in the right axilla likely seroma     Latest Ref Rng & Units 05/17/2022    8:32 AM  CMP  Glucose 70 - 99 mg/dL 78   BUN 8 - 23 mg/dL 15   Creatinine 0.44 - 1.00 mg/dL 0.59   Sodium 135 - 145 mmol/L 139   Potassium 3.5 - 5.1 mmol/L 4.2   Chloride 98 - 111 mmol/L 103   CO2 22 - 32 mmol/L 29   Calcium 8.9 - 10.3 mg/dL 9.7   Total Protein 6.5 - 8.1 g/dL 6.9   Total Bilirubin 0.3 - 1.2 mg/dL 0.5   Alkaline Phos 38 - 126 U/L 75   AST 15 - 41 U/L 20   ALT  0 - 44 U/L 10       Latest Ref Rng & Units  05/17/2022    8:32 AM  CBC  WBC 4.0 - 10.5 K/uL 10.8   Hemoglobin 12.0 - 15.0 g/dL 13.7   Hematocrit 36.0 - 46.0 % 42.0   Platelets 150 - 400 K/uL 232      Assessment and plan- Patient is a 75 y.o. female with clinical prognostic stage IIIb invasive apocrine  carcinoma of the right breast apocrine variety T2 N1 M0 triple negative.  He has completed 12 weekly cycles of CarboTaxol chemotherapy.  She is here for on treatment assessment prior to cycle 4 of adjuvant Keytruda  Patient is tolerating treatments well without any significant side effects.  Counts otherwise okay to proceed with cycle 4 of adjuvant Keytruda today and I will see her back in 3 weeks for cycle 5.  Plan is to complete 9 cycles.  Radiation dermatitis: Continue topical Aquaphor and follow-up with radiation oncology if there are any further side effects  Soft tissue swelling in the right axilla: Likely secondary to seroma.  Dr. Peyton Najjar is arranging ultrasound to evaluate this further   Visit Diagnosis 1. Malignant neoplasm of upper-outer quadrant of right breast in female, estrogen receptor negative ([place])   2. Encounter for antineoplastic immunotherapy      Dr. Randa Evens, MD, MPH [facility] [phone] 05/17/2022 9:54 AM

## 2022-05-17 NOTE — Patient Instructions (Signed)
[facility]  Discharge Instructions: Thank you for choosing [facility] to provide your oncology and hematology care.  If you have a lab appointment with the Cancer Center, please go directly to the Cancer Center and check in at the registration area.  Wear comfortable clothing and clothing appropriate for easy access to any Portacath or PICC line.   We strive to give you quality time with your provider. You may need to reschedule your appointment if you arrive late (15 or more minutes).  Arriving late affects you and other patients whose appointments are after yours.  Also, if you miss three or more appointments without notifying the office, you may be dismissed from the clinic at the provider's discretion.      For prescription refill requests, have your pharmacy contact our office and allow 72 hours for refills to be completed.    Today you received the following chemotherapy and/or immunotherapy agents Keytruda      To help prevent nausea and vomiting after your treatment, we encourage you to take your nausea medication as directed.  BELOW ARE SYMPTOMS THAT SHOULD BE REPORTED IMMEDIATELY: *FEVER GREATER THAN 100.4 F (38 C) OR HIGHER *CHILLS OR SWEATING *NAUSEA AND VOMITING THAT IS NOT CONTROLLED WITH YOUR NAUSEA MEDICATION *UNUSUAL SHORTNESS OF BREATH *UNUSUAL BRUISING OR BLEEDING *URINARY PROBLEMS (pain or burning when urinating, or frequent urination) *BOWEL PROBLEMS (unusual diarrhea, constipation, pain near the anus) TENDERNESS IN MOUTH AND THROAT WITH OR WITHOUT PRESENCE OF ULCERS (sore throat, sores in mouth, or a toothache) UNUSUAL RASH, SWELLING OR PAIN  UNUSUAL VAGINAL DISCHARGE OR ITCHING   Items with * indicate a potential emergency and should be followed up as soon as possible or go to the Emergency Department if any problems should occur.  Please show the CHEMOTHERAPY ALERT CARD or IMMUNOTHERAPY ALERT CARD at check-in to  the Emergency Department and triage nurse.  Should you have questions after your visit or need to cancel or reschedule your appointment, please contact [facility]  [phone] and follow the prompts.  Office hours are 8:00 a.m. to 4:30 p.m. Monday - Friday. Please note that voicemails left after 4:00 p.m. may not be returned until the following business day.  We are closed weekends and major holidays. You have access to a nurse at all times for urgent questions. Please call the main number to the clinic [phone] and follow the prompts.  For any non-urgent questions, you may also contact your provider using MyChart. We now offer e-Visits for anyone 18 and older to request care online for non-urgent symptoms. For details visit [email or web address].   Also download the MyChart app! Go to the app store, search "MyChart", open the app, select [facility], and log in with your MyChart username and password.  Masks are optional in the cancer centers. If you would like for your care team to wear a mask while they are taking care of you, please let them know. For doctor visits, patients may have with them one support person who is at least 76 years old. At this time, visitors are not allowed in the infusion area.   

## 2022-05-18 ENCOUNTER — Other Ambulatory Visit: Payer: Self-pay

## 2022-05-18 ENCOUNTER — Encounter: Payer: Self-pay | Admitting: *Deleted

## 2022-05-18 ENCOUNTER — Inpatient Hospital Stay: Payer: Medicare HMO

## 2022-05-18 ENCOUNTER — Ambulatory Visit
Admission: RE | Admit: 2022-05-18 | Discharge: 2022-05-18 | Disposition: A | Discharge status: Home / Self Care | Payer: Medicare HMO | Source: Ambulatory Visit | Attending: Radiation Oncology | Admitting: Radiation Oncology

## 2022-05-18 DIAGNOSIS — Z510 Encounter for antineoplastic radiation therapy: Secondary | ICD-10-CM | POA: Diagnosis not present

## 2022-05-18 LAB — RAD ONC ARIA SESSION SUMMARY
Course Elapsed Days: 28
Plan Fractions Treated to Date: 21
Plan Prescribed Dose Per Fraction: 1.8 Gy
Plan Total Fractions Prescribed: 28
Plan Total Prescribed Dose: 50.4 Gy
Reference Point Dosage Given to Date: 37.8 Gy
Reference Point Session Dosage Given: 1.8 Gy
Session Number: 21

## 2022-05-19 ENCOUNTER — Other Ambulatory Visit: Payer: Self-pay

## 2022-05-19 ENCOUNTER — Ambulatory Visit
Admission: RE | Admit: 2022-05-19 | Discharge: 2022-05-19 | Disposition: A | Discharge status: Home / Self Care | Payer: Medicare HMO | Source: Ambulatory Visit | Attending: Radiation Oncology | Admitting: Radiation Oncology

## 2022-05-19 ENCOUNTER — Inpatient Hospital Stay: Payer: Medicare HMO

## 2022-05-19 ENCOUNTER — Encounter: Payer: Self-pay | Admitting: *Deleted

## 2022-05-19 DIAGNOSIS — Z510 Encounter for antineoplastic radiation therapy: Secondary | ICD-10-CM | POA: Diagnosis not present

## 2022-05-19 LAB — RAD ONC ARIA SESSION SUMMARY
Course Elapsed Days: 29
Plan Fractions Treated to Date: 22
Plan Prescribed Dose Per Fraction: 1.8 Gy
Plan Total Fractions Prescribed: 28
Plan Total Prescribed Dose: 50.4 Gy
Reference Point Dosage Given to Date: 39.6 Gy
Reference Point Session Dosage Given: 1.8 Gy
Session Number: 22

## 2022-05-20 ENCOUNTER — Other Ambulatory Visit: Payer: Self-pay

## 2022-05-20 ENCOUNTER — Inpatient Hospital Stay: Payer: Medicare HMO

## 2022-05-20 ENCOUNTER — Encounter: Payer: Self-pay | Admitting: *Deleted

## 2022-05-20 ENCOUNTER — Ambulatory Visit
Admission: RE | Admit: 2022-05-20 | Discharge: 2022-05-20 | Disposition: A | Discharge status: Home / Self Care | Payer: Medicare HMO | Source: Ambulatory Visit | Attending: Radiation Oncology | Admitting: Radiation Oncology

## 2022-05-20 DIAGNOSIS — Z510 Encounter for antineoplastic radiation therapy: Secondary | ICD-10-CM | POA: Diagnosis not present

## 2022-05-20 LAB — RAD ONC ARIA SESSION SUMMARY
Course Elapsed Days: 30
Plan Fractions Treated to Date: 23
Plan Prescribed Dose Per Fraction: 1.8 Gy
Plan Total Fractions Prescribed: 28
Plan Total Prescribed Dose: 50.4 Gy
Reference Point Dosage Given to Date: 41.4 Gy
Reference Point Session Dosage Given: 1.8 Gy
Session Number: 23

## 2022-05-23 ENCOUNTER — Ambulatory Visit: Payer: Medicare HMO

## 2022-05-23 ENCOUNTER — Inpatient Hospital Stay: Payer: Medicare HMO

## 2022-05-23 ENCOUNTER — Ambulatory Visit: Admission: RE | Admit: 2022-05-23 | Payer: Medicare HMO | Source: Ambulatory Visit

## 2022-05-23 DIAGNOSIS — Z510 Encounter for antineoplastic radiation therapy: Secondary | ICD-10-CM | POA: Diagnosis not present

## 2022-05-24 ENCOUNTER — Other Ambulatory Visit: Payer: Self-pay

## 2022-05-24 ENCOUNTER — Inpatient Hospital Stay: Payer: Medicare HMO

## 2022-05-24 ENCOUNTER — Ambulatory Visit
Admission: RE | Admit: 2022-05-24 | Discharge: 2022-05-24 | Disposition: A | Discharge status: Home / Self Care | Payer: Medicare HMO | Source: Ambulatory Visit | Attending: Radiation Oncology | Admitting: Radiation Oncology

## 2022-05-24 ENCOUNTER — Encounter: Payer: Self-pay | Admitting: *Deleted

## 2022-05-24 DIAGNOSIS — Z510 Encounter for antineoplastic radiation therapy: Secondary | ICD-10-CM | POA: Diagnosis not present

## 2022-05-24 LAB — RAD ONC ARIA SESSION SUMMARY
Course Elapsed Days: 34
Plan Fractions Treated to Date: 1
Plan Prescribed Dose Per Fraction: 2 Gy
Plan Total Fractions Prescribed: 5
Plan Total Prescribed Dose: 10 Gy
Reference Point Dosage Given to Date: 2 Gy
Reference Point Session Dosage Given: 2 Gy
Session Number: 24

## 2022-05-25 ENCOUNTER — Inpatient Hospital Stay: Payer: Medicare HMO

## 2022-05-25 ENCOUNTER — Ambulatory Visit
Admission: RE | Admit: 2022-05-25 | Discharge: 2022-05-25 | Disposition: A | Discharge status: Home / Self Care | Payer: Medicare HMO | Source: Ambulatory Visit | Attending: Radiation Oncology | Admitting: Radiation Oncology

## 2022-05-25 ENCOUNTER — Encounter: Payer: Self-pay | Admitting: *Deleted

## 2022-05-25 ENCOUNTER — Other Ambulatory Visit: Payer: Self-pay

## 2022-05-25 DIAGNOSIS — Z510 Encounter for antineoplastic radiation therapy: Secondary | ICD-10-CM | POA: Diagnosis not present

## 2022-05-25 LAB — RAD ONC ARIA SESSION SUMMARY
Course Elapsed Days: 35
Plan Fractions Treated to Date: 2
Plan Prescribed Dose Per Fraction: 2 Gy
Plan Total Fractions Prescribed: 5
Plan Total Prescribed Dose: 10 Gy
Reference Point Dosage Given to Date: 4 Gy
Reference Point Session Dosage Given: 2 Gy
Session Number: 25

## 2022-05-26 ENCOUNTER — Inpatient Hospital Stay: Payer: Medicare HMO

## 2022-05-26 ENCOUNTER — Ambulatory Visit
Admission: RE | Admit: 2022-05-26 | Discharge: 2022-05-26 | Disposition: A | Discharge status: Home / Self Care | Payer: Medicare HMO | Source: Ambulatory Visit | Attending: Radiation Oncology | Admitting: Radiation Oncology

## 2022-05-26 ENCOUNTER — Encounter: Payer: Self-pay | Admitting: *Deleted

## 2022-05-26 ENCOUNTER — Other Ambulatory Visit: Payer: Self-pay

## 2022-05-26 DIAGNOSIS — Z510 Encounter for antineoplastic radiation therapy: Secondary | ICD-10-CM | POA: Diagnosis not present

## 2022-05-26 LAB — RAD ONC ARIA SESSION SUMMARY
Course Elapsed Days: 36
Plan Fractions Treated to Date: 3
Plan Prescribed Dose Per Fraction: 2 Gy
Plan Total Fractions Prescribed: 5
Plan Total Prescribed Dose: 10 Gy
Reference Point Dosage Given to Date: 6 Gy
Reference Point Session Dosage Given: 2 Gy
Session Number: 26

## 2022-05-27 ENCOUNTER — Other Ambulatory Visit: Payer: Self-pay

## 2022-05-27 ENCOUNTER — Inpatient Hospital Stay: Payer: Medicare HMO

## 2022-05-27 ENCOUNTER — Ambulatory Visit
Admission: RE | Admit: 2022-05-27 | Discharge: 2022-05-27 | Disposition: A | Discharge status: Home / Self Care | Payer: Medicare HMO | Source: Ambulatory Visit | Attending: Radiation Oncology | Admitting: Radiation Oncology

## 2022-05-27 DIAGNOSIS — Z510 Encounter for antineoplastic radiation therapy: Secondary | ICD-10-CM | POA: Diagnosis not present

## 2022-05-27 LAB — RAD ONC ARIA SESSION SUMMARY
Course Elapsed Days: 37
Plan Fractions Treated to Date: 4
Plan Prescribed Dose Per Fraction: 2 Gy
Plan Total Fractions Prescribed: 5
Plan Total Prescribed Dose: 10 Gy
Reference Point Dosage Given to Date: 8 Gy
Reference Point Session Dosage Given: 2 Gy
Session Number: 27

## 2022-05-30 ENCOUNTER — Other Ambulatory Visit: Payer: Self-pay

## 2022-05-30 ENCOUNTER — Inpatient Hospital Stay: Payer: Medicare HMO

## 2022-05-30 ENCOUNTER — Encounter (INDEPENDENT_AMBULATORY_CARE_PROVIDER_SITE_OTHER): Payer: Self-pay

## 2022-05-30 ENCOUNTER — Encounter: Payer: Self-pay | Admitting: *Deleted

## 2022-05-30 ENCOUNTER — Ambulatory Visit
Admission: RE | Admit: 2022-05-30 | Discharge: 2022-05-30 | Disposition: A | Discharge status: Home / Self Care | Payer: Medicare HMO | Source: Ambulatory Visit | Attending: Radiation Oncology | Admitting: Radiation Oncology

## 2022-05-30 DIAGNOSIS — Z510 Encounter for antineoplastic radiation therapy: Secondary | ICD-10-CM | POA: Diagnosis not present

## 2022-05-30 LAB — RAD ONC ARIA SESSION SUMMARY
Course Elapsed Days: 40
Plan Fractions Treated to Date: 5
Plan Prescribed Dose Per Fraction: 2 Gy
Plan Total Fractions Prescribed: 5
Plan Total Prescribed Dose: 10 Gy
Reference Point Dosage Given to Date: 10 Gy
Reference Point Session Dosage Given: 2 Gy
Session Number: 28

## 2022-05-31 ENCOUNTER — Ambulatory Visit
Admission: RE | Admit: 2022-05-31 | Discharge: 2022-05-31 | Disposition: A | Discharge status: Home / Self Care | Payer: Medicare HMO | Source: Ambulatory Visit | Attending: Radiation Oncology | Admitting: Radiation Oncology

## 2022-05-31 ENCOUNTER — Other Ambulatory Visit: Payer: Self-pay

## 2022-05-31 ENCOUNTER — Encounter: Payer: Self-pay | Admitting: *Deleted

## 2022-05-31 ENCOUNTER — Inpatient Hospital Stay: Payer: Medicare HMO

## 2022-05-31 DIAGNOSIS — Z510 Encounter for antineoplastic radiation therapy: Secondary | ICD-10-CM | POA: Diagnosis not present

## 2022-05-31 LAB — RAD ONC ARIA SESSION SUMMARY
Course Elapsed Days: 41
Plan Fractions Treated to Date: 24
Plan Prescribed Dose Per Fraction: 1.8 Gy
Plan Total Fractions Prescribed: 28
Plan Total Prescribed Dose: 50.4 Gy
Reference Point Dosage Given to Date: 43.2 Gy
Reference Point Session Dosage Given: 1.8 Gy
Session Number: 29

## 2022-06-01 ENCOUNTER — Inpatient Hospital Stay: Payer: Medicare HMO | Attending: Oncology

## 2022-06-01 ENCOUNTER — Encounter: Payer: Self-pay | Admitting: *Deleted

## 2022-06-01 ENCOUNTER — Ambulatory Visit
Admission: RE | Admit: 2022-06-01 | Discharge: 2022-06-01 | Disposition: A | Discharge status: Home / Self Care | Payer: Medicare HMO | Source: Ambulatory Visit | Attending: Radiation Oncology | Admitting: Radiation Oncology

## 2022-06-01 ENCOUNTER — Other Ambulatory Visit: Payer: Self-pay

## 2022-06-01 DIAGNOSIS — Z171 Estrogen receptor negative status [ER-]: Secondary | ICD-10-CM | POA: Insufficient documentation

## 2022-06-01 DIAGNOSIS — Z7951 Long term (current) use of inhaled steroids: Secondary | ICD-10-CM | POA: Insufficient documentation

## 2022-06-01 DIAGNOSIS — Z510 Encounter for antineoplastic radiation therapy: Secondary | ICD-10-CM | POA: Insufficient documentation

## 2022-06-01 DIAGNOSIS — Z79899 Other long term (current) drug therapy: Secondary | ICD-10-CM | POA: Insufficient documentation

## 2022-06-01 DIAGNOSIS — C50411 Malignant neoplasm of upper-outer quadrant of right female breast: Secondary | ICD-10-CM | POA: Insufficient documentation

## 2022-06-01 DIAGNOSIS — Z9981 Dependence on supplemental oxygen: Secondary | ICD-10-CM | POA: Insufficient documentation

## 2022-06-01 DIAGNOSIS — J449 Chronic obstructive pulmonary disease, unspecified: Secondary | ICD-10-CM | POA: Insufficient documentation

## 2022-06-01 DIAGNOSIS — Z7982 Long term (current) use of aspirin: Secondary | ICD-10-CM | POA: Insufficient documentation

## 2022-06-01 DIAGNOSIS — Z87891 Personal history of nicotine dependence: Secondary | ICD-10-CM | POA: Insufficient documentation

## 2022-06-01 DIAGNOSIS — Z5112 Encounter for antineoplastic immunotherapy: Secondary | ICD-10-CM | POA: Insufficient documentation

## 2022-06-01 DIAGNOSIS — C773 Secondary and unspecified malignant neoplasm of axilla and upper limb lymph nodes: Secondary | ICD-10-CM | POA: Insufficient documentation

## 2022-06-01 LAB — RAD ONC ARIA SESSION SUMMARY
Course Elapsed Days: 42
Plan Fractions Treated to Date: 25
Plan Prescribed Dose Per Fraction: 1.8 Gy
Plan Total Fractions Prescribed: 28
Plan Total Prescribed Dose: 50.4 Gy
Reference Point Dosage Given to Date: 45 Gy
Reference Point Session Dosage Given: 1.8 Gy
Session Number: 30

## 2022-06-02 ENCOUNTER — Other Ambulatory Visit: Payer: Self-pay

## 2022-06-02 ENCOUNTER — Inpatient Hospital Stay: Payer: Medicare HMO

## 2022-06-02 ENCOUNTER — Ambulatory Visit
Admission: RE | Admit: 2022-06-02 | Discharge: 2022-06-02 | Disposition: A | Discharge status: Home / Self Care | Payer: Medicare HMO | Source: Ambulatory Visit | Attending: Radiation Oncology | Admitting: Radiation Oncology

## 2022-06-02 DIAGNOSIS — Z510 Encounter for antineoplastic radiation therapy: Secondary | ICD-10-CM | POA: Diagnosis not present

## 2022-06-02 LAB — RAD ONC ARIA SESSION SUMMARY
Course Elapsed Days: 43
Plan Fractions Treated to Date: 26
Plan Prescribed Dose Per Fraction: 1.8 Gy
Plan Total Fractions Prescribed: 28
Plan Total Prescribed Dose: 50.4 Gy
Reference Point Dosage Given to Date: 46.8 Gy
Reference Point Session Dosage Given: 1.8 Gy
Session Number: 31

## 2022-06-03 ENCOUNTER — Encounter: Payer: Self-pay | Admitting: *Deleted

## 2022-06-03 ENCOUNTER — Ambulatory Visit
Admission: RE | Admit: 2022-06-03 | Discharge: 2022-06-03 | Disposition: A | Discharge status: Home / Self Care | Payer: Medicare HMO | Source: Ambulatory Visit | Attending: Radiation Oncology | Admitting: Radiation Oncology

## 2022-06-03 ENCOUNTER — Other Ambulatory Visit: Payer: Self-pay

## 2022-06-03 ENCOUNTER — Inpatient Hospital Stay: Payer: Medicare HMO

## 2022-06-03 ENCOUNTER — Ambulatory Visit: Payer: Medicare HMO

## 2022-06-03 DIAGNOSIS — Z510 Encounter for antineoplastic radiation therapy: Secondary | ICD-10-CM | POA: Diagnosis not present

## 2022-06-03 LAB — RAD ONC ARIA SESSION SUMMARY
Course Elapsed Days: 44
Plan Fractions Treated to Date: 27
Plan Prescribed Dose Per Fraction: 1.8 Gy
Plan Total Fractions Prescribed: 28
Plan Total Prescribed Dose: 50.4 Gy
Reference Point Dosage Given to Date: 48.6 Gy
Reference Point Session Dosage Given: 1.8 Gy
Session Number: 32

## 2022-06-04 ENCOUNTER — Other Ambulatory Visit: Payer: Self-pay

## 2022-06-06 ENCOUNTER — Ambulatory Visit
Admission: RE | Admit: 2022-06-06 | Discharge: 2022-06-06 | Disposition: A | Discharge status: Home / Self Care | Payer: Medicare HMO | Source: Ambulatory Visit | Attending: Radiation Oncology | Admitting: Radiation Oncology

## 2022-06-06 ENCOUNTER — Other Ambulatory Visit: Payer: Self-pay

## 2022-06-06 ENCOUNTER — Inpatient Hospital Stay: Payer: Medicare HMO

## 2022-06-06 DIAGNOSIS — Z510 Encounter for antineoplastic radiation therapy: Secondary | ICD-10-CM | POA: Diagnosis not present

## 2022-06-06 LAB — RAD ONC ARIA SESSION SUMMARY
Course Elapsed Days: 47
Plan Fractions Treated to Date: 28
Plan Prescribed Dose Per Fraction: 1.8 Gy
Plan Total Fractions Prescribed: 28
Plan Total Prescribed Dose: 50.4 Gy
Reference Point Dosage Given to Date: 50.4 Gy
Reference Point Session Dosage Given: 1.8 Gy
Session Number: 33

## 2022-06-07 ENCOUNTER — Inpatient Hospital Stay: Payer: Medicare HMO

## 2022-06-07 ENCOUNTER — Encounter: Payer: Self-pay | Admitting: Oncology

## 2022-06-07 ENCOUNTER — Inpatient Hospital Stay (HOSPITAL_BASED_OUTPATIENT_CLINIC_OR_DEPARTMENT_OTHER): Payer: Medicare HMO | Admitting: Oncology

## 2022-06-07 VITALS — BP 112/67 | HR 81 | Temp 98.1°F | Resp 16 | Wt 88.2 lb

## 2022-06-07 DIAGNOSIS — R911 Solitary pulmonary nodule: Secondary | ICD-10-CM

## 2022-06-07 DIAGNOSIS — Z171 Estrogen receptor negative status [ER-]: Secondary | ICD-10-CM | POA: Diagnosis not present

## 2022-06-07 DIAGNOSIS — Z79899 Other long term (current) drug therapy: Secondary | ICD-10-CM | POA: Diagnosis not present

## 2022-06-07 DIAGNOSIS — Z87891 Personal history of nicotine dependence: Secondary | ICD-10-CM | POA: Diagnosis not present

## 2022-06-07 DIAGNOSIS — Z5112 Encounter for antineoplastic immunotherapy: Secondary | ICD-10-CM

## 2022-06-07 DIAGNOSIS — Z9981 Dependence on supplemental oxygen: Secondary | ICD-10-CM | POA: Diagnosis not present

## 2022-06-07 DIAGNOSIS — Z7982 Long term (current) use of aspirin: Secondary | ICD-10-CM | POA: Diagnosis not present

## 2022-06-07 DIAGNOSIS — C50411 Malignant neoplasm of upper-outer quadrant of right female breast: Secondary | ICD-10-CM

## 2022-06-07 DIAGNOSIS — Z7951 Long term (current) use of inhaled steroids: Secondary | ICD-10-CM | POA: Diagnosis not present

## 2022-06-07 DIAGNOSIS — C773 Secondary and unspecified malignant neoplasm of axilla and upper limb lymph nodes: Secondary | ICD-10-CM | POA: Diagnosis present

## 2022-06-07 DIAGNOSIS — J449 Chronic obstructive pulmonary disease, unspecified: Secondary | ICD-10-CM | POA: Diagnosis not present

## 2022-06-07 LAB — CBC WITH DIFFERENTIAL/PLATELET
Abs Immature Granulocytes: 0.09 10*3/uL — ABNORMAL HIGH (ref 0.00–0.07)
Basophils Absolute: 0.1 10*3/uL (ref 0.0–0.1)
Basophils Relative: 1 %
Eosinophils Absolute: 0.2 10*3/uL (ref 0.0–0.5)
Eosinophils Relative: 3 %
HCT: 42.1 % (ref 36.0–46.0)
Hemoglobin: 13.4 g/dL (ref 12.0–15.0)
Immature Granulocytes: 1 %
Lymphocytes Relative: 6 %
Lymphs Abs: 0.5 10*3/uL — ABNORMAL LOW (ref 0.7–4.0)
MCH: 31.6 pg (ref 26.0–34.0)
MCHC: 31.8 g/dL (ref 30.0–36.0)
MCV: 99.3 fL (ref 80.0–100.0)
Monocytes Absolute: 1.4 10*3/uL — ABNORMAL HIGH (ref 0.1–1.0)
Monocytes Relative: 15 %
Neutro Abs: 7 10*3/uL (ref 1.7–7.7)
Neutrophils Relative %: 74 %
Platelets: 261 10*3/uL (ref 150–400)
RBC: 4.24 MIL/uL (ref 3.87–5.11)
RDW: 13.2 % (ref 11.5–15.5)
WBC: 9.3 10*3/uL (ref 4.0–10.5)
nRBC: 0 % (ref 0.0–0.2)

## 2022-06-07 LAB — COMPREHENSIVE METABOLIC PANEL
ALT: 11 U/L (ref 0–44)
AST: 21 U/L (ref 15–41)
Albumin: 3.5 g/dL (ref 3.5–5.0)
Alkaline Phosphatase: 93 U/L (ref 38–126)
Anion gap: 10 (ref 5–15)
BUN: 20 mg/dL (ref 8–23)
CO2: 26 mmol/L (ref 22–32)
Calcium: 9.6 mg/dL (ref 8.9–10.3)
Chloride: 99 mmol/L (ref 98–111)
Creatinine, Ser: 0.41 mg/dL — ABNORMAL LOW (ref 0.44–1.00)
GFR, Estimated: >60 mL/min (ref >60)
Glucose, Bld: 108 mg/dL — ABNORMAL HIGH (ref 70–99)
Potassium: 4.2 mmol/L (ref 3.5–5.1)
Sodium: 135 mmol/L (ref 135–145)
Total Bilirubin: 0.5 mg/dL (ref 0.3–1.2)
Total Protein: 6.9 g/dL (ref 6.5–8.1)

## 2022-06-07 MED ORDER — HEPARIN SOD (PORK) LOCK FLUSH 100 UNIT/ML IV SOLN
500.0000 [IU] | Freq: Once | INTRAVENOUS | Status: DC | PRN
  Filled 2022-06-07: qty 5

## 2022-06-07 MED ORDER — SODIUM CHLORIDE 0.9 % IV SOLN
200.0000 mg | Freq: Once | INTRAVENOUS | Status: AC
  Administered 2022-06-07: 200 mg via INTRAVENOUS
  Filled 2022-06-07: qty 8

## 2022-06-07 MED ORDER — SODIUM CHLORIDE 0.9 % IV SOLN
Freq: Once | INTRAVENOUS | Status: AC
  Filled 2022-06-07: qty 250

## 2022-06-07 NOTE — Patient Instructions (Signed)
[facility]  Discharge Instructions: Thank you for choosing [facility] to provide your oncology and hematology care.  If you have a lab appointment with the Cancer Center, please go directly to the Cancer Center and check in at the registration area.  Wear comfortable clothing and clothing appropriate for easy access to any Portacath or PICC line.   We strive to give you quality time with your provider. You may need to reschedule your appointment if you arrive late (15 or more minutes).  Arriving late affects you and other patients whose appointments are after yours.  Also, if you miss three or more appointments without notifying the office, you may be dismissed from the clinic at the provider's discretion.      For prescription refill requests, have your pharmacy contact our office and allow 72 hours for refills to be completed.    Today you received the following chemotherapy and/or immunotherapy agents KEYTRUDA      To help prevent nausea and vomiting after your treatment, we encourage you to take your nausea medication as directed.  BELOW ARE SYMPTOMS THAT SHOULD BE REPORTED IMMEDIATELY: *FEVER GREATER THAN 100.4 F (38 C) OR HIGHER *CHILLS OR SWEATING *NAUSEA AND VOMITING THAT IS NOT CONTROLLED WITH YOUR NAUSEA MEDICATION *UNUSUAL SHORTNESS OF BREATH *UNUSUAL BRUISING OR BLEEDING *URINARY PROBLEMS (pain or burning when urinating, or frequent urination) *BOWEL PROBLEMS (unusual diarrhea, constipation, pain near the anus) TENDERNESS IN MOUTH AND THROAT WITH OR WITHOUT PRESENCE OF ULCERS (sore throat, sores in mouth, or a toothache) UNUSUAL RASH, SWELLING OR PAIN  UNUSUAL VAGINAL DISCHARGE OR ITCHING   Items with * indicate a potential emergency and should be followed up as soon as possible or go to the Emergency Department if any problems should occur.  Please show the CHEMOTHERAPY ALERT CARD or IMMUNOTHERAPY ALERT CARD at check-in to  the Emergency Department and triage nurse.  Should you have questions after your visit or need to cancel or reschedule your appointment, please contact [facility]  [phone] and follow the prompts.  Office hours are 8:00 a.m. to 4:30 p.m. Monday - Friday. Please note that voicemails left after 4:00 p.m. may not be returned until the following business day.  We are closed weekends and major holidays. You have access to a nurse at all times for urgent questions. Please call the main number to the clinic [phone] and follow the prompts.  For any non-urgent questions, you may also contact your provider using MyChart. We now offer e-Visits for anyone 18 and older to request care online for non-urgent symptoms. For details visit [email or web address].   Also download the MyChart app! Go to the app store, search "MyChart", open the app, select [facility], and log in with your MyChart username and password.  Masks are optional in the cancer centers. If you would like for your care team to wear a mask while they are taking care of you, please let them know. For doctor visits, patients may have with them one support person who is at least 76 years old. At this time, visitors are not allowed in the infusion area. Pembrolizumab Injection What is this medication? PEMBROLIZUMAB (PEM broe LIZ ue mab) treats some types of cancer. It works by helping your immune system slow or stop the spread of cancer cells. It is a monoclonal antibody. This medicine may be used for other purposes; ask your health care provider or pharmacist if you have questions. COMMON   BRAND NAME(S): Keytruda What should I tell my care team before I take this medication? They need to know if you have any of these conditions: Allogeneic stem cell transplant (uses someone else's stem cells) Autoimmune diseases, such as Crohn disease, ulcerative colitis, lupus History of chest radiation Nervous system  problems, such as Guillain-Barre syndrome, myasthenia gravis Organ transplant An unusual or allergic reaction to pembrolizumab, other medications, foods, dyes, or preservatives Pregnant or trying to get pregnant Breast-feeding How should I use this medication? This medication is injected into a vein. It is given by your care team in a hospital or clinic setting. A special MedGuide will be given to you before each treatment. Be sure to read this information carefully each time. Talk to your care team about the use of this medication in children. While it may be prescribed for children as young as 6 months for selected conditions, precautions do apply. Overdosage: If you think you have taken too much of this medicine contact a poison control center or emergency room at once. NOTE: This medicine is only for you. Do not share this medicine with others. What if I miss a dose? Keep appointments for follow-up doses. It is important not to miss your dose. Call your care team if you are unable to keep an appointment. What may interact with this medication? Interactions have not been studied. This list may not describe all possible interactions. Give your health care provider a list of all the medicines, herbs, non-prescription drugs, or dietary supplements you use. Also tell them if you smoke, drink alcohol, or use illegal drugs. Some items may interact with your medicine. What should I watch for while using this medication? Your condition will be monitored carefully while you are receiving this medication. You may need blood work while taking this medication. This medication may cause serious skin reactions. They can happen weeks to months after starting the medication. Contact your care team right away if you notice fevers or flu-like symptoms with a rash. The rash may be red or purple and then turn into blisters or peeling of the skin. You may also notice a red rash with swelling of the face, lips, or  lymph nodes in your neck or under your arms. Tell your care team right away if you have any change in your eyesight. Talk to your care team if you may be pregnant. Serious birth defects can occur if you take this medication during pregnancy and for 4 months after the last dose. You will need a negative pregnancy test before starting this medication. Contraception is recommended while taking this medication and for 4 months after the last dose. Your care team can help you find the option that works for you. Do not breastfeed while taking this medication and for 4 months after the last dose. What side effects may I notice from receiving this medication? Side effects that you should report to your care team as soon as possible: Allergic reactions--skin rash, itching, hives, swelling of the face, lips, tongue, or throat Dry cough, shortness of breath or trouble breathing Eye pain, redness, irritation, or discharge with blurry or decreased vision Heart muscle inflammation--unusual weakness or fatigue, shortness of breath, chest pain, fast or irregular heartbeat, dizziness, swelling of the ankles, feet, or hands Hormone gland problems--headache, sensitivity to light, unusual weakness or fatigue, dizziness, fast or irregular heartbeat, increased sensitivity to cold or heat, excessive sweating, constipation, hair loss, increased thirst or amount of urine, tremors or shaking, irritability Infusion reactions--chest   pain, shortness of breath or trouble breathing, feeling faint or lightheaded Kidney injury (glomerulonephritis)--decrease in the amount of urine, red or dark brown urine, foamy or bubbly urine, swelling of the ankles, hands, or feet Liver injury--right upper belly pain, loss of appetite, nausea, light-colored stool, dark yellow or brown urine, yellowing skin or eyes, unusual weakness or fatigue Pain, tingling, or numbness in the hands or feet, muscle weakness, change in vision, confusion or trouble  speaking, loss of balance or coordination, trouble walking, seizures Rash, fever, and swollen lymph nodes Redness, blistering, peeling, or loosening of the skin, including inside the mouth Sudden or severe stomach pain, bloody diarrhea, fever, nausea, vomiting Side effects that usually do not require medical attention (report to your care team if they continue or are bothersome): Bone, joint, or muscle pain Diarrhea Fatigue Loss of appetite Nausea Skin rash This list may not describe all possible side effects. Call your doctor for medical advice about side effects. You may report side effects to FDA at 1-800-FDA-1088. Where should I keep my medication? This medication is given in a hospital or clinic. It will not be stored at home. NOTE: This sheet is a summary. It may not cover all possible information. If you have questions about this medicine, talk to your doctor, pharmacist, or health care provider.  2023 Elsevier/Gold Standard (2013-04-08 00:00:00)    

## 2022-06-07 NOTE — Progress Notes (Signed)
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  That incidentally showed right axillary adenopathy and soft tissue in the superior right breast.  This was followed by diagnostic right breast mammogram and ultrasound which showed a 2.3 x 1.6 x 1.2 cm irregular hypoechoic mass in the right breast at the 12 o'clock position 1 cm from the nipple.  Enlarged right axillary lymph node 1.8 cm.  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  Patient has oxygen dependent COPD.     PET CT scan shows FDG avid spiculated nodule with central cavitation in the left upper lobe.  Size an SUV of this nodule was not quantified on the PET scan.  Partially calcified markedly avid right breast lesion with markedly avid right axillary lymph node metastases.  Patient underwent 1 cycle of Taxol chemotherapy while awaiting surgery and underwent final surgery on 12/28/2021.  Final pathology showed18 mm grade 3 invasive mammary carcinoma triple negative. Macrometastases and at least for an micrometastases in 0 lymph nodes.  Largest size of metastatic deposit 30 mm.  Extranodal extension present.  pT1C pN2A   Given age and frailty plan is to proceed with  CarboTaxol chemotherapy alone for 12 cycles.  Beryle Flock was added with cycle 12 of CarboTaxol and plan is to continue 9 cycles of adjuvant Keytruda  Interval history-tolerating Keytruda well without any significant side effects.  ECOG PS- 2 Pain scale- 0   Review of systems- Review of Systems  Constitutional:  Negative for chills, fever, malaise/fatigue and weight loss.  HENT:  Negative for congestion, ear discharge and nosebleeds.   Eyes:  Negative for blurred vision.  Respiratory:  Negative for cough, hemoptysis, sputum production, shortness of breath and wheezing.   Cardiovascular:  Negative for chest pain, palpitations, orthopnea and claudication.  Gastrointestinal:  Negative for abdominal pain, blood in stool, constipation, diarrhea, heartburn, melena, nausea and vomiting.  Genitourinary:  Negative for dysuria, flank pain, frequency, hematuria and urgency.  Musculoskeletal:  Negative for back pain, joint pain and myalgias.  Skin:  Negative for rash.  Neurological:  Negative for dizziness, tingling, focal weakness, seizures, weakness and headaches.  Endo/Heme/Allergies:  Does not bruise/bleed easily.  Psychiatric/Behavioral:  Negative for depression and suicidal ideas. The patient does not have insomnia.       Allergies  Allergen Reactions   Penicillins Rash and Other (See Comments)     Past Medical History:  Diagnosis Date   Arthritis    Cancer ([place])    CHF (congestive heart failure) (HCC)    COPD (chronic obstructive pulmonary disease) (HCC)    Dementia ([place])    Hyperlipidemia      Past Surgical History:  Procedure Laterality Date   BREAST BIOPSY Right 10/14/2021   rt 12:00 [place] bx venus clip path pending   BREAST  BIOPSY Right 10/14/2021   rt axilla hydromarker path pending   EYE SURGERY Bilateral 2019   cataract   MASTECTOMY MODIFIED RADICAL Right 12/28/2021   Procedure: MASTECTOMY MODIFIED RADICAL;  Surgeon: Herbert Pun, MD;  Location: [facility] ORS;   Service: General;  Laterality: Right;   PORTACATH PLACEMENT N/A 11/25/2021   Procedure: INSERTION PORT-A-CATH;  Surgeon: Herbert Pun, MD;  Location: [facility] ORS;  Service: General;  Laterality: N/A;    Social History   Socioeconomic History   Marital status: Divorced    Spouse name: Not on file   Number of children: Not on file   Years of education: Not on file   Highest education level: Not on file  Occupational History   Not on file  Tobacco Use   Smoking status: Former    Types: Cigarettes    Quit date: 10/2019    Years since quitting: 2.6    Passive exposure: Past   Smokeless tobacco: Never  Vaping Use   Vaping Use: Never used  Substance and Sexual Activity   Alcohol use: Not Currently   Drug use: Not Currently   Sexual activity: Not Currently  Other Topics Concern   Not on file  Social History Narrative   Not on file   Social Determinants of Health   Financial Resource Strain: Not on file  Food Insecurity: Not on file  Transportation Needs: Unmet Transportation Needs (06/06/2022)   PRAPARE - Hydrologist (Medical): Yes    Lack of Transportation (Non-Medical): Yes  Physical Activity: Not on file  Stress: Not on file  Social Connections: Not on file  Intimate Partner Violence: Not on file    Family History  Problem Relation Age of Onset   Breast cancer Mother    Lung cancer Sister      Current Outpatient Medications:    acetaminophen (TYLENOL) 325 MG tablet, Take 650 mg by mouth See admin instructions. Take 2 tablets (650 mg) by mouth 3 times daily scheduled (0800, 1400 & 2000) & take 2 tablets (650 mg) by mouth up to twice daily as needed for pain., Disp: , Rfl:    albuterol (VENTOLIN HFA) 108 (90 Base) MCG/ACT inhaler, Inhale 2 puffs into the lungs every 6 (six) hours as needed for shortness of breath or wheezing., Disp: , Rfl:    Albuterol Sulfate 2.5 MG/0.5ML NEBU, Inhale into the lungs as needed., Disp: , Rfl:     alendronate (FOSAMAX) 70 MG tablet, Take 70 mg by mouth every Monday., Disp: , Rfl:    ammonium lactate (LAC-HYDRIN) 12 % lotion, Apply 1 application. topically daily. (0800) Lower legs., Disp: , Rfl:    aspirin EC 81 MG tablet, Take 81 mg by mouth daily. Swallow whole., Disp: , Rfl:    atorvastatin (LIPITOR) 20 MG tablet, Take 20 mg by mouth daily. (0800), Disp: , Rfl:    budesonide (PULMICORT) 0.5 MG/2ML nebulizer solution, Inhale 0.5 mg into the lungs daily. (0800), Disp: , Rfl:    calcium carbonate (TUMS EX) 750 MG chewable tablet, Chew 1 tablet by mouth in the morning and at bedtime. (0800 & 2000), Disp: , Rfl:    Cholecalciferol (VITAMIN D3) 50 MCG (2000 UT) TABS, Take 2,000 Units by mouth daily. (0800), Disp: , Rfl:    INCRUSE ELLIPTA 62.5 MCG/ACT AEPB, Inhale 1 puff into the lungs daily. (0800), Disp: , Rfl:    LUMIGAN 0.01 % SOLN, Place 1 drop into both eyes at bedtime. (2000), Disp: , Rfl:  metoprolol succinate (TOPROL-XL) 25 MG 24 hr tablet, Take 25 mg by mouth daily. (0800) Hold if SBP<100 or Pulse <60., Disp: , Rfl:    Multiple Vitamins-Minerals (HEALTHY EYES SUPERVISION 2 PO), Take 1 capsule by mouth in the morning and at bedtime. (0730 & 1730), Disp: , Rfl:    OXYGEN, Inhale 2 L/min into the lungs as needed (shortness of breath OR if pulse ox is <90% on room air)., Disp: , Rfl:    potassium chloride SA (KLOR-CON M) 20 MEQ tablet, Take 20 mEq by mouth daily. (0800), Disp: , Rfl:    guaiFENesin (MUCINEX) 600 MG 12 hr tablet, Take 600 mg by mouth 2 (two) times daily as needed (congestion.). (Patient not taking: Reported on 03/07/2022), Disp: , Rfl:    traMADol (ULTRAM) 50 MG tablet, Take 1 tablet (50 mg total) by mouth every 6 (six) hours as needed. (Patient not taking: Reported on 06/07/2022), Disp: 20 tablet, Rfl: 0 No current facility-administered medications for this visit.  Facility-Administered Medications Ordered in Other Visits:    diphenhydrAMINE (BENADRYL) 50 MG/ML injection, ,  , ,    famotidine (PEPCID) 20-0.9 MG/50ML-% IVPB, , , ,    heparin lock flush 100 unit/mL, 500 Units, Intracatheter, Once PRN, Sindy Guadeloupe, MD  Physical exam:  Vitals:   06/07/22 1000  BP: 112/67  Pulse: 81  Resp: 16  Temp: 98.1 F (36.7 C)  TempSrc: Tympanic  Weight: 88 lb 3.2 oz (40 kg)   Physical Exam Constitutional:      Comments: Elderly female sitting in a wheelchair.  She is on home oxygen.  No acute distress  Cardiovascular:     Rate and Rhythm: Normal rate and regular rhythm.     Heart sounds: Normal heart sounds.  Pulmonary:     Effort: Pulmonary effort is normal.     Breath sounds: Normal breath sounds.  Abdominal:     General: Bowel sounds are normal.     Palpations: Abdomen is soft.  Musculoskeletal:     Comments: Stable size of right chest wall seroma  Skin:    General: Skin is warm and dry.  Neurological:     Mental Status: She is alert and oriented to person, place, and time.         Latest Ref Rng & Units 06/07/2022    9:40 AM  CMP  Glucose 70 - 99 mg/dL 108   BUN 8 - 23 mg/dL 20   Creatinine 0.44 - 1.00 mg/dL 0.41   Sodium 135 - 145 mmol/L 135   Potassium 3.5 - 5.1 mmol/L 4.2   Chloride 98 - 111 mmol/L 99   CO2 22 - 32 mmol/L 26   Calcium 8.9 - 10.3 mg/dL 9.6   Total Protein 6.5 - 8.1 g/dL 6.9   Total Bilirubin 0.3 - 1.2 mg/dL 0.5   Alkaline Phos 38 - 126 U/L 93   AST 15 - 41 U/L 21   ALT 0 - 44 U/L 11       Latest Ref Rng & Units 06/07/2022    9:40 AM  CBC  WBC 4.0 - 10.5 K/uL 9.3   Hemoglobin 12.0 - 15.0 g/dL 13.4   Hematocrit 36.0 - 46.0 % 42.1   Platelets 150 - 400 K/uL 261      Assessment and plan- Patient is a 76 y.o. female with clinical prognostic stage IIIb invasive apocrine  carcinoma of the right breast apocrine variety T2 N1 M0 triple negative.  He has completed 12  weekly cycles of CarboTaxol chemotherapy.   She is here for on treatment assessment prior to cycle 5 of adjuvant Keytruda  Counts are good proceed with cycle  5 of adjuvant Keytruda today.  I will see her back in 3 weeks for cycle 6.  Plan is to complete 9 cycles.  Patient does have right chest wall seroma which appears stable to mildly decreased as compared to prior visit.  She was supposed to get an ultrasound by Dr. Peyton Najjar.  I will reach out to him regarding that.  Patient also found to have a left upper lobe lung nodule back in April 2023.  I will follow-up with another CT scan of this time  I am also referring the patient to genetic counseling given her diagnosis of triple negative breast cancer   Visit Diagnosis 1. Malignant neoplasm of upper-outer quadrant of right breast in female, estrogen receptor negative ([place])   2. Pulmonary nodule, right      Dr. Randa Evens, MD, MPH [facility] at [facility] [phone] 06/07/2022 4:51 PM

## 2022-06-07 NOTE — Progress Notes (Signed)
Decrease in appetite with stable wt today. Does have an increase in fatigue and  dry itchy skin,   Finished XRT yesterday

## 2022-06-09 ENCOUNTER — Other Ambulatory Visit: Payer: Self-pay

## 2022-06-10 ENCOUNTER — Other Ambulatory Visit: Payer: Self-pay

## 2022-06-13 ENCOUNTER — Ambulatory Visit
Admission: RE | Admit: 2022-06-13 | Discharge: 2022-06-13 | Disposition: A | Discharge status: Home / Self Care | Payer: Medicare HMO | Source: Ambulatory Visit | Attending: Oncology | Admitting: Oncology

## 2022-06-13 DIAGNOSIS — R911 Solitary pulmonary nodule: Secondary | ICD-10-CM | POA: Insufficient documentation

## 2022-06-13 DIAGNOSIS — Z171 Estrogen receptor negative status [ER-]: Secondary | ICD-10-CM | POA: Insufficient documentation

## 2022-06-13 DIAGNOSIS — C50411 Malignant neoplasm of upper-outer quadrant of right female breast: Secondary | ICD-10-CM | POA: Diagnosis not present

## 2022-06-28 ENCOUNTER — Inpatient Hospital Stay: Payer: Medicare HMO

## 2022-06-28 ENCOUNTER — Encounter: Payer: Self-pay | Admitting: Licensed Clinical Social Worker

## 2022-06-28 ENCOUNTER — Encounter: Payer: Self-pay | Admitting: Oncology

## 2022-06-28 ENCOUNTER — Inpatient Hospital Stay (HOSPITAL_BASED_OUTPATIENT_CLINIC_OR_DEPARTMENT_OTHER): Payer: Medicare HMO | Admitting: Oncology

## 2022-06-28 ENCOUNTER — Inpatient Hospital Stay: Payer: Medicare HMO | Admitting: Licensed Clinical Social Worker

## 2022-06-28 VITALS — BP 110/73 | HR 87 | Temp 98.3°F | Resp 20 | Wt 85.2 lb

## 2022-06-28 DIAGNOSIS — C50411 Malignant neoplasm of upper-outer quadrant of right female breast: Secondary | ICD-10-CM | POA: Diagnosis not present

## 2022-06-28 DIAGNOSIS — Z171 Estrogen receptor negative status [ER-]: Secondary | ICD-10-CM

## 2022-06-28 DIAGNOSIS — Z803 Family history of malignant neoplasm of breast: Secondary | ICD-10-CM

## 2022-06-28 DIAGNOSIS — Z5112 Encounter for antineoplastic immunotherapy: Secondary | ICD-10-CM

## 2022-06-28 LAB — COMPREHENSIVE METABOLIC PANEL
ALT: 10 U/L (ref 0–44)
AST: 24 U/L (ref 15–41)
Albumin: 3.4 g/dL — ABNORMAL LOW (ref 3.5–5.0)
Alkaline Phosphatase: 90 U/L (ref 38–126)
Anion gap: 9 (ref 5–15)
BUN: 17 mg/dL (ref 8–23)
CO2: 29 mmol/L (ref 22–32)
Calcium: 10.2 mg/dL (ref 8.9–10.3)
Chloride: 97 mmol/L — ABNORMAL LOW (ref 98–111)
Creatinine, Ser: 0.59 mg/dL (ref 0.44–1.00)
GFR, Estimated: >60 mL/min (ref >60)
Glucose, Bld: 130 mg/dL — ABNORMAL HIGH (ref 70–99)
Potassium: 3.7 mmol/L (ref 3.5–5.1)
Sodium: 135 mmol/L (ref 135–145)
Total Bilirubin: 0.5 mg/dL (ref 0.3–1.2)
Total Protein: 7.1 g/dL (ref 6.5–8.1)

## 2022-06-28 LAB — CBC WITH DIFFERENTIAL/PLATELET
Abs Immature Granulocytes: 0.1 10*3/uL — ABNORMAL HIGH (ref 0.00–0.07)
Basophils Absolute: 0.1 10*3/uL (ref 0.0–0.1)
Basophils Relative: 1 %
Eosinophils Absolute: 0.2 10*3/uL (ref 0.0–0.5)
Eosinophils Relative: 2 %
HCT: 42.3 % (ref 36.0–46.0)
Hemoglobin: 13.7 g/dL (ref 12.0–15.0)
Immature Granulocytes: 1 %
Lymphocytes Relative: 6 %
Lymphs Abs: 0.6 10*3/uL — ABNORMAL LOW (ref 0.7–4.0)
MCH: 31.3 pg (ref 26.0–34.0)
MCHC: 32.4 g/dL (ref 30.0–36.0)
MCV: 96.6 fL (ref 80.0–100.0)
Monocytes Absolute: 0.8 10*3/uL (ref 0.1–1.0)
Monocytes Relative: 9 %
Neutro Abs: 7.3 10*3/uL (ref 1.7–7.7)
Neutrophils Relative %: 81 %
Platelets: 241 10*3/uL (ref 150–400)
RBC: 4.38 MIL/uL (ref 3.87–5.11)
RDW: 13.9 % (ref 11.5–15.5)
WBC: 9.1 10*3/uL (ref 4.0–10.5)
nRBC: 0 % (ref 0.0–0.2)

## 2022-06-28 LAB — TSH: TSH: 2.485 u[IU]/mL (ref 0.350–4.500)

## 2022-06-28 MED ORDER — SODIUM CHLORIDE 0.9% FLUSH
10.0000 mL | INTRAVENOUS | Status: DC | PRN
  Administered 2022-06-28: 10 mL
  Filled 2022-06-28: qty 10

## 2022-06-28 MED ORDER — SODIUM CHLORIDE 0.9% FLUSH
10.0000 mL | Freq: Once | INTRAVENOUS | Status: AC
  Administered 2022-06-28: 10 mL via INTRAVENOUS
  Filled 2022-06-28: qty 10

## 2022-06-28 MED ORDER — SODIUM CHLORIDE 0.9 % IV SOLN
200.0000 mg | Freq: Once | INTRAVENOUS | Status: AC
  Administered 2022-06-28: 200 mg via INTRAVENOUS
  Filled 2022-06-28: qty 200

## 2022-06-28 MED ORDER — HEPARIN SOD (PORK) LOCK FLUSH 100 UNIT/ML IV SOLN
500.0000 [IU] | Freq: Once | INTRAVENOUS | Status: DC
  Filled 2022-06-28: qty 5

## 2022-06-28 MED ORDER — SODIUM CHLORIDE 0.9 % IV SOLN
Freq: Once | INTRAVENOUS | Status: AC
  Filled 2022-06-28: qty 250

## 2022-06-28 MED ORDER — HEPARIN SOD (PORK) LOCK FLUSH 100 UNIT/ML IV SOLN
500.0000 [IU] | Freq: Once | INTRAVENOUS | Status: AC | PRN
  Administered 2022-06-28: 500 [IU]
  Filled 2022-06-28: qty 5

## 2022-06-28 NOTE — Progress Notes (Signed)
REFERRING PROVIDER: Sindy Guadeloupe, MD [place],  [place] [address]  PRIMARY PROVIDER:  Verl Blalock, NP  PRIMARY REASON FOR VISIT:  1. Malignant neoplasm of upper-outer quadrant of right breast in female, estrogen receptor negative ([place])   2. Family history of breast cancer      HISTORY OF PRESENT ILLNESS:   Nancy Berg, a 76 y.o. female, was seen for a [place] cancer genetics consultation at the request of Dr. Janese Banks due to a personal and family history of breast cancer.  Nancy Berg presents to clinic today to discuss the possibility of a hereditary predisposition to cancer, genetic testing, and to further clarify her future cancer risks, as well as potential cancer risks for family members.   In 2023, at the age of 66, Nancy Berg was diagnosed with triple negative right breast cancer. This was treated with 1 cycle of chemotherapy before surgery, surgery in May of 2023, and she is currently being treated with chemotherapy. She reports having skin cancer in her late 20s-early 62s, and believes this was basal cell carcinoma.   CANCER HISTORY:  Oncology History  Malignant neoplasm of upper-outer quadrant of right breast in female, estrogen receptor negative ([place])  10/27/2021 Initial Diagnosis   Malignant neoplasm of upper-outer quadrant of right breast in female, estrogen receptor negative ([place])   11/15/2021 Cancer Staging   Staging form: Breast, AJCC 8th Edition - Clinical stage from 11/15/2021: Stage IIIB (cT2, cN1, cM0, G3, ER-, PR-, HER2-) - Signed by Sindy Guadeloupe, MD on 11/15/2021 Histologic grading system: 3 grade system   12/01/2021 - 03/21/2022 Chemotherapy   Patient is on Treatment Plan : BREAST Paclitaxel q7d     04/05/2022 -  Chemotherapy   Patient is on Treatment Plan : BREAST Pembrolizumab adjuvant for 9 cycles      Past Medical History:  Diagnosis Date   Arthritis    Cancer ([place])    CHF (congestive heart failure) (HCC)    COPD (chronic  obstructive pulmonary disease) ([place])    Dementia ([place])    Hyperlipidemia     Past Surgical History:  Procedure Laterality Date   BREAST BIOPSY Right 10/14/2021   rt 12:00 [place] bx venus clip path pending   BREAST BIOPSY Right 10/14/2021   rt axilla hydromarker path pending   EYE SURGERY Bilateral 2019   cataract   MASTECTOMY MODIFIED RADICAL Right 12/28/2021   Procedure: MASTECTOMY MODIFIED RADICAL;  Surgeon: Herbert Pun, MD;  Location: [facility] ORS;  Service: General;  Laterality: Right;   PORTACATH PLACEMENT N/A 11/25/2021   Procedure: INSERTION PORT-A-CATH;  Surgeon: Herbert Pun, MD;  Location: [facility] ORS;  Service: General;  Laterality: N/A;    FAMILY HISTORY:  We obtained a detailed, 4-generation family history.  Significant diagnoses are listed below: Family History  Problem Relation Age of Onset   Breast cancer Mother        dx 3s   Lung cancer Sister 4   Nancy Berg has 1 daughter, 42 and 1 son, 46. Her son was recently diagnosed with a mitochondrial condition called MIDD. He is seen by [facility] for this, and they are currently in the process of testing Nancy Berg and her daughter for it. Nancy Berg also had 1 sister who passed at 54 from lung cancer that had spread.  Nancy Berg's mother had breast cancer in her 58s and did not pass from it. She passed in her 101s. Limited information about the rest of the maternal family  members, no other known cancers.  Nancy Berg's father passed at 63. Patient has limited information about these relatives as well, no known cancers.  Nancy Berg is unaware of previous family history of genetic testing for hereditary cancer risks. There is no reported Ashkenazi Jewish ancestry. There is no known consanguinity.    GENETIC COUNSELING ASSESSMENT: Nancy Berg is a 76 y.o. female with a personal and family history of breast cancer which is somewhat suggestive of a hereditary cancer syndrome and predisposition  to cancer. We, therefore, discussed and recommended the following at today's visit.   DISCUSSION: We discussed that approximately 10% of breast cancer is hereditary. Most cases of hereditary breast cancer are associated with BRCA1/BRCA2 genes, although there are other genes associated with hereditary cancer as well. Cancers and risks are gene specific. We discussed that testing is beneficial for several reasons including knowing about cancer risks, identifying potential screening and risk-reduction options that may be appropriate, and to understand if other family members could be at risk for cancer and allow them to undergo genetic testing.   We reviewed the characteristics, features and inheritance patterns of hereditary cancer syndromes. We also discussed genetic testing, including the appropriate family members to test, the process of testing, insurance coverage and turn-around-time for results. We discussed the implications of a negative, positive and/or variant of uncertain significant result. We recommended Nancy Berg pursue genetic testing for the [employer] Common Hereditary Cancers+RNA gene panel.   Based on Nancy Berg's personal and family history of cancer, she meets medical criteria for genetic testing. Despite that she meets criteria, she may still have an out of pocket cost. We discussed that if her out of pocket cost for testing is over $100, the laboratory will call and confirm whether she wants to proceed with testing.  If the out of pocket cost of testing is less than $100 she will be billed by the genetic testing laboratory.   PLAN: After considering the risks, benefits, and limitations, Nancy Berg provided informed consent to pursue genetic testing and the blood sample was sent to [facility] for analysis of the Multi-Cancer+RNA panel. Results should be available within approximately 2-3 weeks' time, at which point they will be disclosed by telephone to Nancy Berg, as  will any additional recommendations warranted by these results. Nancy Berg will receive a summary of her genetic counseling visit and a copy of her results once available. This information will also be available in Epic.   Ms. Bulluck questions were answered to her satisfaction today. Our contact information was provided should additional questions or concerns arise. Thank you for the referral and allowing [place] to share in the care of your patient.   Faith Rogue, MS, [facility] Genetic Counselor [email or web address] Phone: [phone]  The patient was seen for a total of 15 minutes in face-to-face genetic counseling in the infusion room.  Her son was also present. Dr. Grayland Ormond was available for discussion regarding this case.   _______________________________________________________________________ For Office Staff:  Number of people involved in session: 2 Was an Intern/ student involved with case: no

## 2022-06-28 NOTE — Progress Notes (Signed)
Pt and son in for follow up, pt denies any concern today.

## 2022-06-28 NOTE — Patient Instructions (Signed)
[facility] [place] [place]  Discharge Instructions: Thank you for choosing [facility] to provide your oncology and hematology care.  If you have a lab appointment with the Cancer Center, please go directly to the Cancer Center and check in at the registration area.  Wear comfortable clothing and clothing appropriate for easy access to any Portacath or PICC line.   We strive to give you quality time with your provider. You may need to reschedule your appointment if you arrive late (15 or more minutes).  Arriving late affects you and other patients whose appointments are after yours.  Also, if you miss three or more appointments without notifying the office, you may be dismissed from the clinic at the provider's discretion.      For prescription refill requests, have your pharmacy contact our office and allow 72 hours for refills to be completed.    Today you received the following chemotherapy and/or immunotherapy agents KEYTRUDA      To help prevent nausea and vomiting after your treatment, we encourage you to take your nausea medication as directed.  BELOW ARE SYMPTOMS THAT SHOULD BE REPORTED IMMEDIATELY: *FEVER GREATER THAN 100.4 F (38 C) OR HIGHER *CHILLS OR SWEATING *NAUSEA AND VOMITING THAT IS NOT CONTROLLED WITH YOUR NAUSEA MEDICATION *UNUSUAL SHORTNESS OF BREATH *UNUSUAL BRUISING OR BLEEDING *URINARY PROBLEMS (pain or burning when urinating, or frequent urination) *BOWEL PROBLEMS (unusual diarrhea, constipation, pain near the anus) TENDERNESS IN MOUTH AND THROAT WITH OR WITHOUT PRESENCE OF ULCERS (sore throat, sores in mouth, or a toothache) UNUSUAL RASH, SWELLING OR PAIN  UNUSUAL VAGINAL DISCHARGE OR ITCHING   Items with * indicate a potential emergency and should be followed up as soon as possible or go to the Emergency Department if any problems should occur.  Please show the CHEMOTHERAPY ALERT CARD or IMMUNOTHERAPY ALERT CARD at check-in to  the Emergency Department and triage nurse.  Should you have questions after your visit or need to cancel or reschedule your appointment, please contact [facility]  [phone] and follow the prompts.  Office hours are 8:00 a.m. to 4:30 p.m. Monday - Friday. Please note that voicemails left after 4:00 p.m. may not be returned until the following business day.  We are closed weekends and major holidays. You have access to a nurse at all times for urgent questions. Please call the main number to the clinic [phone] and follow the prompts.  For any non-urgent questions, you may also contact your provider using MyChart. We now offer e-Visits for anyone 18 and older to request care online for non-urgent symptoms. For details visit [email or web address].   Also download the MyChart app! Go to the app store, search "MyChart", open the app, select [facility], and log in with your MyChart username and password.  Masks are optional in the cancer centers. If you would like for your care team to wear a mask while they are taking care of you, please let them know. For doctor visits, patients may have with them one support person who is at least 76 years old. At this time, visitors are not allowed in the infusion area. Pembrolizumab Injection What is this medication? PEMBROLIZUMAB (PEM broe LIZ ue mab) treats some types of cancer. It works by helping your immune system slow or stop the spread of cancer cells. It is a monoclonal antibody. This medicine may be used for other purposes; ask your health care provider or pharmacist if you have questions. COMMON   BRAND NAME(S): Keytruda What should I tell my care team before I take this medication? They need to know if you have any of these conditions: Allogeneic stem cell transplant (uses someone else's stem cells) Autoimmune diseases, such as Crohn disease, ulcerative colitis, lupus History of chest radiation Nervous system  problems, such as Guillain-Barre syndrome, myasthenia gravis Organ transplant An unusual or allergic reaction to pembrolizumab, other medications, foods, dyes, or preservatives Pregnant or trying to get pregnant Breast-feeding How should I use this medication? This medication is injected into a vein. It is given by your care team in a hospital or clinic setting. A special MedGuide will be given to you before each treatment. Be sure to read this information carefully each time. Talk to your care team about the use of this medication in children. While it may be prescribed for children as young as 6 months for selected conditions, precautions do apply. Overdosage: If you think you have taken too much of this medicine contact a poison control center or emergency room at once. NOTE: This medicine is only for you. Do not share this medicine with others. What if I miss a dose? Keep appointments for follow-up doses. It is important not to miss your dose. Call your care team if you are unable to keep an appointment. What may interact with this medication? Interactions have not been studied. This list may not describe all possible interactions. Give your health care provider a list of all the medicines, herbs, non-prescription drugs, or dietary supplements you use. Also tell them if you smoke, drink alcohol, or use illegal drugs. Some items may interact with your medicine. What should I watch for while using this medication? Your condition will be monitored carefully while you are receiving this medication. You may need blood work while taking this medication. This medication may cause serious skin reactions. They can happen weeks to months after starting the medication. Contact your care team right away if you notice fevers or flu-like symptoms with a rash. The rash may be red or purple and then turn into blisters or peeling of the skin. You may also notice a red rash with swelling of the face, lips, or  lymph nodes in your neck or under your arms. Tell your care team right away if you have any change in your eyesight. Talk to your care team if you may be pregnant. Serious birth defects can occur if you take this medication during pregnancy and for 4 months after the last dose. You will need a negative pregnancy test before starting this medication. Contraception is recommended while taking this medication and for 4 months after the last dose. Your care team can help you find the option that works for you. Do not breastfeed while taking this medication and for 4 months after the last dose. What side effects may I notice from receiving this medication? Side effects that you should report to your care team as soon as possible: Allergic reactions--skin rash, itching, hives, swelling of the face, lips, tongue, or throat Dry cough, shortness of breath or trouble breathing Eye pain, redness, irritation, or discharge with blurry or decreased vision Heart muscle inflammation--unusual weakness or fatigue, shortness of breath, chest pain, fast or irregular heartbeat, dizziness, swelling of the ankles, feet, or hands Hormone gland problems--headache, sensitivity to light, unusual weakness or fatigue, dizziness, fast or irregular heartbeat, increased sensitivity to cold or heat, excessive sweating, constipation, hair loss, increased thirst or amount of urine, tremors or shaking, irritability Infusion reactions--chest   pain, shortness of breath or trouble breathing, feeling faint or lightheaded Kidney injury (glomerulonephritis)--decrease in the amount of urine, red or dark brown urine, foamy or bubbly urine, swelling of the ankles, hands, or feet Liver injury--right upper belly pain, loss of appetite, nausea, light-colored stool, dark yellow or brown urine, yellowing skin or eyes, unusual weakness or fatigue Pain, tingling, or numbness in the hands or feet, muscle weakness, change in vision, confusion or trouble  speaking, loss of balance or coordination, trouble walking, seizures Rash, fever, and swollen lymph nodes Redness, blistering, peeling, or loosening of the skin, including inside the mouth Sudden or severe stomach pain, bloody diarrhea, fever, nausea, vomiting Side effects that usually do not require medical attention (report to your care team if they continue or are bothersome): Bone, joint, or muscle pain Diarrhea Fatigue Loss of appetite Nausea Skin rash This list may not describe all possible side effects. Call your doctor for medical advice about side effects. You may report side effects to FDA at 1-800-FDA-1088. Where should I keep my medication? This medication is given in a hospital or clinic. It will not be stored at home. NOTE: This sheet is a summary. It may not cover all possible information. If you have questions about this medicine, talk to your doctor, pharmacist, or health care provider.  2023 Elsevier/Gold Standard (2013-04-08 00:00:00)    

## 2022-06-28 NOTE — Progress Notes (Signed)
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  That incidentally showed right axillary adenopathy and soft tissue in the superior right breast.  This was followed by diagnostic right breast mammogram and ultrasound which showed a 2.3 x 1.6 x 1.2 cm irregular hypoechoic mass in the right breast at the 12 o'clock position 1 cm from the nipple.  Enlarged right axillary lymph node 1.8 cm.  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  Patient has oxygen dependent COPD.     PET CT scan shows FDG avid spiculated nodule with central cavitation in the left upper lobe.  Size an SUV of this nodule was not quantified on the PET scan.  Partially calcified markedly avid right breast lesion with markedly avid right axillary lymph node metastases.  Patient underwent 1 cycle of Taxol chemotherapy while awaiting surgery and underwent final surgery on 12/28/2021.  Final pathology showed18 mm grade 3 invasive mammary carcinoma triple negative. Macrometastases and at least for an micrometastases in 0 lymph nodes.  Largest size of metastatic deposit 30 mm.  Extranodal extension present.  pT1C pN2A   Given age and frailty plan is to proceed  with CarboTaxol chemotherapy alone for 12 cycles.  Beryle Flock was added with cycle 12 of CarboTaxol and plan is to continue 9 cycles of adjuvant Keytruda.  Patient also got adjuvant radiation therapy  Interval history-patient is tolerating treatments well so far.  No recent hospitalizations.  Denies any nausea vomiting or diarrhea.  She has baseline fatigue and exertional shortness of breath  ECOG PS- 2 Pain scale- 0   Review of systems- Review of Systems  Constitutional:  Positive for malaise/fatigue. Negative for chills, fever and weight loss.  HENT:  Negative for congestion, ear discharge and nosebleeds.   Eyes:  Negative for blurred vision.  Respiratory:  Positive for shortness of breath. Negative for cough, hemoptysis, sputum production and wheezing.   Cardiovascular:  Negative for chest pain, palpitations, orthopnea and claudication.  Gastrointestinal:  Negative for abdominal pain, blood in stool, constipation, diarrhea, heartburn, melena, nausea and vomiting.  Genitourinary:  Negative for dysuria, flank pain, frequency, hematuria and urgency.  Musculoskeletal:  Negative for back pain, joint pain and myalgias.  Skin:  Negative for rash.  Neurological:  Negative for dizziness, tingling, focal weakness, seizures, weakness and headaches.  Endo/Heme/Allergies:  Does not bruise/bleed easily.  Psychiatric/Behavioral:  Negative for depression and suicidal ideas. The patient does not have insomnia.       Allergies  Allergen Reactions   Penicillins Rash and Other (See Comments)     Past Medical History:  Diagnosis Date   Arthritis    Cancer ([place])    CHF (congestive heart failure) ([place])    COPD (chronic obstructive pulmonary disease) ([place])    Dementia ([place])  Hyperlipidemia      Past Surgical History:  Procedure Laterality Date   BREAST BIOPSY Right 10/14/2021   rt 12:00 [place] bx venus clip path pending   BREAST BIOPSY Right 10/14/2021   rt axilla hydromarker path pending   EYE  SURGERY Bilateral 2019   cataract   MASTECTOMY MODIFIED RADICAL Right 12/28/2021   Procedure: MASTECTOMY MODIFIED RADICAL;  Surgeon: Herbert Pun, MD;  Location: [facility] ORS;  Service: General;  Laterality: Right;   PORTACATH PLACEMENT N/A 11/25/2021   Procedure: INSERTION PORT-A-CATH;  Surgeon: Herbert Pun, MD;  Location: [facility] ORS;  Service: General;  Laterality: N/A;    Social History   Socioeconomic History   Marital status: Divorced    Spouse name: Not on file   Number of children: Not on file   Years of education: Not on file   Highest education level: Not on file  Occupational History   Not on file  Tobacco Use   Smoking status: Former    Types: Cigarettes    Quit date: 10/2019    Years since quitting: 2.6    Passive exposure: Past   Smokeless tobacco: Never  Vaping Use   Vaping Use: Never used  Substance and Sexual Activity   Alcohol use: Not Currently   Drug use: Not Currently   Sexual activity: Not Currently  Other Topics Concern   Not on file  Social History Narrative   Not on file   Social Determinants of Health   Financial Resource Strain: Not on file  Food Insecurity: Not on file  Transportation Needs: Unmet Transportation Needs (06/06/2022)   PRAPARE - Hydrologist (Medical): Yes    Lack of Transportation (Non-Medical): Yes  Physical Activity: Not on file  Stress: Not on file  Social Connections: Not on file  Intimate Partner Violence: Not on file    Family History  Problem Relation Age of Onset   Breast cancer Mother        dx 70s   Lung cancer Sister 77     Current Outpatient Medications:    acetaminophen (TYLENOL) 325 MG tablet, Take 650 mg by mouth See admin instructions. Take 2 tablets (650 mg) by mouth 3 times daily scheduled (0800, 1400 & 2000) & take 2 tablets (650 mg) by mouth up to twice daily as needed for pain., Disp: , Rfl:    albuterol (VENTOLIN HFA) 108 (90 Base) MCG/ACT inhaler, Inhale 2  puffs into the lungs every 6 (six) hours as needed for shortness of breath or wheezing., Disp: , Rfl:    Albuterol Sulfate 2.5 MG/0.5ML NEBU, Inhale into the lungs as needed., Disp: , Rfl:    alendronate (FOSAMAX) 70 MG tablet, Take 70 mg by mouth every Monday., Disp: , Rfl:    ammonium lactate (LAC-HYDRIN) 12 % lotion, Apply 1 application. topically daily. (0800) Lower legs., Disp: , Rfl:    aspirin EC 81 MG tablet, Take 81 mg by mouth daily. Swallow whole., Disp: , Rfl:    atorvastatin (LIPITOR) 20 MG tablet, Take 20 mg by mouth daily. (0800), Disp: , Rfl:    budesonide (PULMICORT) 0.5 MG/2ML nebulizer solution, Inhale 0.5 mg into the lungs daily. (0800), Disp: , Rfl:    calcium carbonate (TUMS EX) 750 MG chewable tablet, Chew 1 tablet by mouth in the morning and at bedtime. (0800 & 2000), Disp: , Rfl:    Cholecalciferol (VITAMIN D3) 50 MCG (2000 UT) TABS, Take 2,000 Units by mouth daily. (0800), Disp: ,  Rfl:    INCRUSE ELLIPTA 62.5 MCG/ACT AEPB, Inhale 1 puff into the lungs daily. (0800), Disp: , Rfl:    LUMIGAN 0.01 % SOLN, Place 1 drop into both eyes at bedtime. (2000), Disp: , Rfl:    metoprolol succinate (TOPROL-XL) 25 MG 24 hr tablet, Take 25 mg by mouth daily. (0800) Hold if SBP<100 or Pulse <60., Disp: , Rfl:    Multiple Vitamins-Minerals (HEALTHY EYES SUPERVISION 2 PO), Take 1 capsule by mouth in the morning and at bedtime. (0730 & 1730), Disp: , Rfl:    OXYGEN, Inhale 2 L/min into the lungs as needed (shortness of breath OR if pulse ox is <90% on room air)., Disp: , Rfl:    potassium chloride SA (KLOR-CON M) 20 MEQ tablet, Take 20 mEq by mouth daily. (0800), Disp: , Rfl:    guaiFENesin (MUCINEX) 600 MG 12 hr tablet, Take 600 mg by mouth 2 (two) times daily as needed (congestion.). (Patient not taking: Reported on 03/07/2022), Disp: , Rfl:    traMADol (ULTRAM) 50 MG tablet, Take 1 tablet (50 mg total) by mouth every 6 (six) hours as needed. (Patient not taking: Reported on 06/07/2022), Disp:  20 tablet, Rfl: 0 No current facility-administered medications for this visit.  Facility-Administered Medications Ordered in Other Visits:    diphenhydrAMINE (BENADRYL) 50 MG/ML injection, , , ,    famotidine (PEPCID) 20-0.9 MG/50ML-% IVPB, , , ,    heparin lock flush 100 unit/mL, 500 Units, Intravenous, Once, Sindy Guadeloupe, MD   sodium chloride flush (NS) 0.9 % injection 10 mL, 10 mL, Intracatheter, PRN, Sindy Guadeloupe, MD, 10 mL at 06/28/22 1132  Physical exam:  Vitals:   06/28/22 0949  BP: 110/73  Pulse: 87  Resp: 20  Temp: 98.3 F (36.8 C)  TempSrc: Tympanic  SpO2: 92%  Weight: 85 lb 3.2 oz (38.6 kg)   Physical Exam Constitutional:      General: She is not in acute distress. Cardiovascular:     Rate and Rhythm: Normal rate and regular rhythm.     Heart sounds: Normal heart sounds.  Pulmonary:     Effort: Pulmonary effort is normal.     Breath sounds: Normal breath sounds.  Abdominal:     General: Bowel sounds are normal.     Palpations: Abdomen is soft.  Skin:    General: Skin is warm and dry.  Neurological:     Mental Status: She is alert and oriented to person, place, and time.   Chest wall exam: Patient is s/p right mastectomy without reconstruction.  No evidence of chest wall recurrence     Latest Ref Rng & Units 06/28/2022    8:57 AM  CMP  Glucose 70 - 99 mg/dL 130   BUN 8 - 23 mg/dL 17   Creatinine 0.44 - 1.00 mg/dL 0.59   Sodium 135 - 145 mmol/L 135   Potassium 3.5 - 5.1 mmol/L 3.7   Chloride 98 - 111 mmol/L 97   CO2 22 - 32 mmol/L 29   Calcium 8.9 - 10.3 mg/dL 10.2   Total Protein 6.5 - 8.1 g/dL 7.1   Total Bilirubin 0.3 - 1.2 mg/dL 0.5   Alkaline Phos 38 - 126 U/L 90   AST 15 - 41 U/L 24   ALT 0 - 44 U/L 10       Latest Ref Rng & Units 06/28/2022    8:57 AM  CBC  WBC 4.0 - 10.5 K/uL 9.1   Hemoglobin 12.0 -  15.0 g/dL 13.7   Hematocrit 36.0 - 46.0 % 42.3   Platelets 150 - 400 K/uL 241     No images are attached to the encounter.  CT  Chest Wo Contrast  Result Date: 06/14/2022 CLINICAL DATA:  76 year old female with history of breast cancer. Upper lobe nodule on the LEFT. * Tracking Code: BO * EXAM: CT CHEST WITHOUT CONTRAST TECHNIQUE: Multidetector CT imaging of the chest was performed following the standard protocol without IV contrast. RADIATION DOSE REDUCTION: This exam was performed according to the departmental dose-optimization program which includes automated exposure control, adjustment of the mA and/or kV according to patient size and/or use of iterative reconstruction technique. COMPARISON:  11/22/2021 FINDINGS: Cardiovascular: Coronary artery calcification and aortic atherosclerotic calcification. Mediastinum/Nodes: No axillary or supraclavicular adenopathy. No mediastinal or hilar adenopathy. No pericardial fluid. Esophagus normal. Lungs/Pleura: Interval decrease in volume of the pleuroparenchymal thickening in the LEFT upper lobe of concern on comparison exam. Region measures 9 mm x 7 mm compared to 12 mm 19 mm. Anterior nodule measures 4 mm in LEFT upper lobe (image 47/3 compared to 5 mm. Centrilobular emphysema the upper lobes. Chronic elevation of the LEFT hemidiaphragm with small effusion basilar atelectasis Upper Abdomen: Limited view of the liver, kidneys, pancreas are unremarkable. Normal adrenal glands. Musculoskeletal: No aggressive osseous lesion. Ovoid lesion in the RIGHT axilla measures 39 mm x 22 mm (58/)2. This lesion was partially hypermetabolic on comparison PET-CT scan where measured 33 mm by 22 mm. Interval mastectomy. IMPRESSION: 1. Reduction in pleuroparenchymal thickening LEFT upper lobe of concern on comparison CT findings consistent benign pleuroparenchymal thickening. 2. Stable pulmonary nodule anterior LEFT upper lobe over 2 year interval consistent benign etiology per Fleischner criteria. No follow-up recommended. 3. Interval increase in ovoid lesion in the LEFT axilla which was hypermetabolic on  comparison FDG PET scan. Favor benign postsurgical seroma. Recommend correlation Electronically Signed   By: Suzy Bouchard M.D.   On: 06/14/2022 14:57     Assessment and plan- Patient is a 76 y.o. female with clinical prognostic stage IIIb invasive apocrine  carcinoma of the right breast apocrine variety T2 N1 M0 triple negative.  He has completed 12 weekly cycles of CarboTaxol chemotherapy.    She is here for on treatment assessment prior to cycle 6 of adjuvant Keytruda  She can proceed with cycle 6 of Keytruda today.  I will see her back in 3 weeks for cycle 7.  Plan is to complete 9 cycles.  I reviewed CT chest imagesDependently in the past findings with the patient which shows overall decrease in the size of the left upper lobe lung nodule consistent with benign pleuroparenchymal thickening.  There was another subcentimeter lung nodule which has remained stable over 2 years and does not require any follow-up.  [name] was diagnosed with maternal mitochondrial disorder and patient is also getting genetic testing for the same.   Visit Diagnosis 1. Malignant neoplasm of upper-outer quadrant of right breast in female, estrogen receptor negative ([place])   2. Encounter for antineoplastic immunotherapy      Dr. Randa Evens, MD, MPH [facility] [phone] 06/28/2022 4:09 PM

## 2022-06-29 LAB — T4: T4, Total: 6.4 ug/dL (ref 4.5–12.0)

## 2022-06-30 ENCOUNTER — Other Ambulatory Visit: Payer: Self-pay

## 2022-07-04 ENCOUNTER — Other Ambulatory Visit: Payer: Self-pay

## 2022-07-07 ENCOUNTER — Telehealth: Payer: Self-pay | Admitting: Licensed Clinical Social Worker

## 2022-07-12 ENCOUNTER — Encounter: Payer: Self-pay | Admitting: Oncology

## 2022-07-12 ENCOUNTER — Encounter: Payer: Self-pay | Admitting: Licensed Clinical Social Worker

## 2022-07-12 ENCOUNTER — Ambulatory Visit: Payer: Self-pay | Admitting: Licensed Clinical Social Worker

## 2022-07-12 DIAGNOSIS — Z1379 Encounter for other screening for genetic and chromosomal anomalies: Secondary | ICD-10-CM | POA: Insufficient documentation

## 2022-07-12 NOTE — Progress Notes (Signed)
HPI:   Nancy Berg was previously seen in the [place] [facility] due to a personal and family history of breast cancer and concerns regarding a hereditary predisposition to cancer. Please refer to our prior cancer genetics clinic note for more information regarding our discussion, assessment and recommendations, at the time. Nancy Berg recent genetic test results were disclosed to her, as were recommendations warranted by these results. These results and recommendations are discussed in more detail below.  CANCER HISTORY:  Oncology History  Malignant neoplasm of upper-outer quadrant of right breast in female, estrogen receptor negative ([place])  10/27/2021 Initial Diagnosis   Malignant neoplasm of upper-outer quadrant of right breast in female, estrogen receptor negative ([place])   11/15/2021 Cancer Staging   Staging form: Breast, AJCC 8th Edition - Clinical stage from 11/15/2021: Stage IIIB (cT2, cN1, cM0, G3, ER-, PR-, HER2-) - Signed by Sindy Guadeloupe, MD Berg 11/15/2021 Histologic grading system: 3 grade system   12/01/2021 - 03/21/2022 Chemotherapy   Patient is Berg Treatment Plan : BREAST Paclitaxel q7d     04/05/2022 -  Chemotherapy   Patient is Berg Treatment Plan : BREAST Pembrolizumab adjuvant for 9 cycles      Genetic Testing   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 No other variants identified. The report date is 07/06/2022.  The Common Hereditary Cancers Panel + RNA offered by [facility] includes sequencing and/or deletion duplication testing of the following 48 genes: APC*, ATM*, AXIN2, BAP1, BARD1, BMPR1A, BRCA1, BRCA2, BRIP1, CDH1, CDK4, CDKN2A (p14ARF), CDKN2A (p16INK4a), CHEK2, CTNNA1, DICER1*, EPCAM*, FH*, GREM1*, HOXB13, KIT, MBD4, MEN1*, MLH1*, MSH2*, MSH3*, MSH6*, MUTYH, NF1*, NTHL1, PALB2, PDGFRA, PMS2*, POLD1*, POLE, PTEN*, RAD51C, RAD51D, SDHA*, SDHB, SDHC*, SDHD, SMAD4, SMARCA4,  STK11, TP53, TSC1*, TSC2, VHL.      FAMILY HISTORY:  We obtained a detailed, 4-generation family history.  Significant diagnoses are listed below: Family History  Problem Relation Age of Onset   Breast cancer Mother        dx 69s   Lung cancer Sister 54   Nancy Berg has 1 daughter, 73 and 1 son, 67. Her son was recently diagnosed with a mitochondrial condition called MIDD. He is seen by [facility] for this, and they are currently in the process of testing Nancy Berg and her daughter for it. Nancy Berg also had 1 sister who passed at 21 from lung cancer that had spread.   Nancy Berg's mother had breast cancer in her 51s and did not pass from it. She passed in her 50s. Limited information about the rest of the maternal family members, no other known cancers.   Nancy Berg's father passed at 32. Patient has limited information about these relatives as well, no known cancers.   Nancy Berg is unaware of previous family history of genetic testing for hereditary cancer risks. There is no reported Ashkenazi Jewish ancestry. There is no known consanguinity.     GENETIC TEST RESULTS:  The [facility] Common Hereditary Cancers+RNA Panel found  a single pathogenic variant in MUTYH called c.1187G>A (p.Gly396Asp). The remainder of testing was negative/normal.  The Common Hereditary Cancers Panel + RNA offered by [facility] includes sequencing and/or deletion duplication testing of the following 48 genes: APC*, ATM*, AXIN2, BAP1, BARD1, BMPR1A, BRCA1, BRCA2, BRIP1, CDH1, CDK4, CDKN2A (p14ARF), CDKN2A (p16INK4a), CHEK2, CTNNA1, DICER1*, EPCAM*, FH*, GREM1*, HOXB13, KIT, MBD4, MEN1*, MLH1*, MSH2*, MSH3*, MSH6*, MUTYH, NF1*, NTHL1, PALB2, PDGFRA, PMS2*, POLD1*, POLE, PTEN*, RAD51C,  RAD51D, SDHA*, SDHB, SDHC*, SDHD, SMAD4, SMARCA4, STK11, TP53, TSC1*, TSC2, VHL.   The test report has been scanned into EPIC and is located under the Molecular Pathology section of the Results Review tab.  A  portion of the result report is included below for reference. Genetic testing reported out Berg 07/06/2022.     This result does not explain the personal and family history of breast cancer for Nancy Berg. Even though a pathogenic variant was not identified, possible explanations for the cancer in the family may include: There may be no hereditary risk for cancer in the family. The cancers in Nancy Berg and/or her family may be sporadic/familial or due to other genetic and environmental factors. There may be a gene mutation in one of these genes that current testing methods cannot detect but that chance is small. There could be another gene that has not yet been discovered, or that we have not yet tested, that is responsible for the cancer diagnoses in the family.  It is also possible there is a hereditary cause for the cancer in the family that Nancy Berg did not inherit.  Therefore, it is important to remain in touch with cancer genetics in the future so that we can continue to offer Nancy Berg the most up to date genetic testing.   MUTYH-Associated Polyposis (MAP) Carrier  MAP occurs in individuals with two pathogenic variants in MUTYH--one in each copy of their MUTYH genes. Individuals with a single MUTYH pathogenic variant, such as what was found for  Nancy Berg, are not affected with MAP.    Individuals with a single pathogenic variant in the MUTYH gene are considered carriers of MAP. If only one parent is an unaffected carrier, he or she has a 50% risk of passing the pathogenic variant Berg to each child. If both parents are unaffected carriers, each of their children has a 1 in 4, or 25%, chance of having MAP.  If an individual is unaffected by colon cancer and has no family history of colon cancer, data are uncertain regarding whether specialized screening is warranted.   An individual's cancer risk and medical management are not determined by genetic test results alone.  Overall cancer risk assessment incorporates additional factors, including personal medical history, family history, and any available genetic information that may result in a personalized plan for cancer prevention and surveillance.  Knowing if pathogenic variants in MUTYH are present is advantageous. At-risk relatives can be identified, enabling pursuit of a diagnostic evaluation. Furthermore, the available information regarding hereditary cancer susceptibility genes is constantly evolving and more clinically relevant data regarding MUTYH are likely to become available in the near future. Awareness of this cancer predisposition encourages patients and their providers to inform at-risk family members, to diligently follow recommended screening protocols, and to be vigilant in maintaining close and regular contact with their local genetics clinic in anticipation of new information.  RECOMMENDATIONS FOR FAMILY MEMBERS:   We recommend all of Nancy Berg's family members have genetic counseling and genetic testing. Because the MUTYH pathogenic variant has been identified in her we can offer genetic testing to relatives to determine if they also carry the MUTYH familial mutation.  As discussed above, having only 1 MUTYH mutation would not significantly impact their colon cancer risk/screening. However, if any relatives have inherited 2 MUTYH mutations (in trans), this would significantly impact their cancer risk.  About 1-2% of the [place] population carries a single MUTYH pathogenic variant, so there is a chance  some relatives may have inherited MUTYH mutations from both of their parents.  We would be happy to meet with any of the family members or refer them to a genetic counselor in their local area.  To locate genetic counselors in other cities, individuals can visit the website of the [employer] ([employer]) and Secretary/administrator for a Social worker by zip code.  ADDITIONAL GENETIC  TESTING:  We discussed with Nancy Berg that her genetic testing was fairly extensive.  If there are additional relevant genes identified to increase cancer risk that can be analyzed in the future, we would be happy to discuss and coordinate this testing at that time.    CANCER SCREENING RECOMMENDATIONS:  We have not identified a hereditary cause for her personal and family history of cancer at this time.  An individual's cancer risk and medical management are not determined by genetic test results alone. Overall cancer risk assessment incorporates additional factors, including personal medical history, family history, and any available genetic information that may result in a personalized plan for cancer prevention and surveillance. Therefore, it is recommended she continue to follow the cancer management and screening guidelines provided by her oncology and primary healthcare provider.  RECOMMENDATIONS FOR FAMILY MEMBERS:   Individuals in this family might be at some increased risk of developing cancer, over the general population risk, due to the family history of cancer.  Individuals in the family should notify their providers of the family history of cancer. We recommend women in this family have a yearly mammogram beginning at age 15, or 35 years younger than the earliest onset of cancer, an annual clinical breast exam, and perform monthly breast self-exams.  Family members should have colonoscopies by at age 78, or earlier, as recommended by their providers. As mentioned above, we recommend relatives have testing for MUTYH mutation. [name] Quillian Quince will meet with me 07/19/2022 to have testing and share this result with other relatives.  FOLLOW-UP:  Lastly, we discussed with Nancy Berg that cancer genetics is a rapidly advancing field and it is possible that new genetic tests will be appropriate for her and/or her family members in the future. We encouraged her to remain in contact with  cancer genetics Berg an annual basis so we can update her personal and family histories and let her know of advances in cancer genetics that may benefit this family.   Our contact number was provided. Nancy Berg questions were answered to her satisfaction, and she knows she is welcome to call us at anytime with additional questions or concerns.    Faith Rogue, MS, [facility] Genetic Counselor [email or web address] Phone: [phone]

## 2022-07-12 NOTE — Telephone Encounter (Signed)
I contacted Ms. Bisping and her son to discuss her genetic testing results. 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 No other pathogenic variants were identified in the 48 genes analyzed. Detailed clinic note to follow.   The test report has been scanned into EPIC and is located under the Molecular Pathology section of the Results Review tab.  A portion of the result report is included below for reference.      Faith Rogue, MS, [facility] Genetic Counselor [email or web address] Phone: [phone]

## 2022-07-14 ENCOUNTER — Ambulatory Visit
Admission: RE | Admit: 2022-07-14 | Discharge: 2022-07-14 | Disposition: A | Discharge status: Home / Self Care | Payer: Medicare HMO | Source: Ambulatory Visit | Attending: Radiation Oncology | Admitting: Radiation Oncology

## 2022-07-14 ENCOUNTER — Encounter: Payer: Self-pay | Admitting: Radiation Oncology

## 2022-07-14 VITALS — BP 132/83 | HR 85 | Temp 98.4°F | Resp 16 | Ht 61.0 in | Wt 88.1 lb

## 2022-07-14 DIAGNOSIS — Z923 Personal history of irradiation: Secondary | ICD-10-CM | POA: Insufficient documentation

## 2022-07-14 DIAGNOSIS — C50411 Malignant neoplasm of upper-outer quadrant of right female breast: Secondary | ICD-10-CM | POA: Diagnosis present

## 2022-07-14 DIAGNOSIS — R54 Age-related physical debility: Secondary | ICD-10-CM | POA: Insufficient documentation

## 2022-07-14 DIAGNOSIS — Z171 Estrogen receptor negative status [ER-]: Secondary | ICD-10-CM | POA: Diagnosis not present

## 2022-07-14 NOTE — Progress Notes (Signed)
Radiation Oncology Follow up Note  Name: Nancy Berg   Date:   07/14/2022 MRN:  [id number] DOB: 10-27-1945    This 76 y.o. 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  REFERRING PROVIDER: Verl Blalock, NP  HPI: Patient is a 76 year old female now out 1 month having completed chest wall and peripheral lymphatic radiation therapy to rest right breast.  She had initially a pT1 cpN2a invasive mammary carcinoma triple negative.  She is seen today 1 month out and is doing somewhat better.  Her skin is completely healed at this point.  She is quite frail.  There are following some lesions in her chest which have not changed over time and seem to be resolving.  COMPLICATIONS OF TREATMENT: none  FOLLOW UP COMPLIANCE: keeps appointments   PHYSICAL EXAM:  BP 132/83 (BP Location: Left Arm, Patient Position: Sitting, Cuff Size: Small)   Pulse 85   Temp 98.4 F (36.9 C) (Tympanic)   Resp 16   Ht '5\' 1"'$  (1.549 m) Comment: stated ht  Wt 88 lb 1.6 oz (40 kg)   BMI 16.65 kg/m  Patient status post right mastectomy chest wall is clear without evidence of mass or nodularity no axillary or supraclavicular adenopathy is identified.  Patient is on nasal oxygen.  Well-developed well-nourished patient in NAD. HEENT reveals PERLA, EOMI, discs not visualized.  Oral cavity is clear. No oral mucosal lesions are identified. Neck is clear without evidence of cervical or supraclavicular adenopathy. Lungs are clear to A&P. Cardiac examination is essentially unremarkable with regular rate and rhythm without murmur rub or thrill. Abdomen is benign with no organomegaly or masses noted. Motor sensory and DTR levels are equal and symmetric in the upper and lower extremities. Cranial nerves II through XII are  grossly intact. Proprioception is intact. No peripheral adenopathy or edema is identified. No motor or sensory levels are noted. Crude visual fields are within normal range.  RADIOLOGY RESULTS: No current films for review  PLAN: Present time patient is recovering well from her radiation therapy to her chest wall.  Pleased her overall progress.  She is quite frail.  I have asked to see her back in 6 months for follow-up.  She is not on endocrine therapy based on the triple negative nature of her disease.  Patient and family know to call with any concerns.  I would like to take this opportunity to thank you for allowing me to participate in the care of your patient.Noreene Filbert, MD

## 2022-07-15 ENCOUNTER — Other Ambulatory Visit: Payer: Self-pay

## 2022-07-19 ENCOUNTER — Inpatient Hospital Stay: Payer: Medicare HMO | Attending: Oncology | Admitting: Oncology

## 2022-07-19 ENCOUNTER — Encounter: Payer: Self-pay | Admitting: Oncology

## 2022-07-19 ENCOUNTER — Inpatient Hospital Stay: Payer: Medicare HMO

## 2022-07-19 VITALS — BP 117/74 | HR 84 | Temp 98.1°F | Resp 16 | Ht 61.0 in | Wt 86.2 lb

## 2022-07-19 DIAGNOSIS — Z5112 Encounter for antineoplastic immunotherapy: Secondary | ICD-10-CM | POA: Diagnosis present

## 2022-07-19 DIAGNOSIS — Z1509 Genetic susceptibility to other malignant neoplasm: Secondary | ICD-10-CM | POA: Diagnosis not present

## 2022-07-19 DIAGNOSIS — C773 Secondary and unspecified malignant neoplasm of axilla and upper limb lymph nodes: Secondary | ICD-10-CM | POA: Insufficient documentation

## 2022-07-19 DIAGNOSIS — J449 Chronic obstructive pulmonary disease, unspecified: Secondary | ICD-10-CM | POA: Insufficient documentation

## 2022-07-19 DIAGNOSIS — C50411 Malignant neoplasm of upper-outer quadrant of right female breast: Secondary | ICD-10-CM | POA: Insufficient documentation

## 2022-07-19 DIAGNOSIS — Z171 Estrogen receptor negative status [ER-]: Secondary | ICD-10-CM

## 2022-07-19 DIAGNOSIS — Z801 Family history of malignant neoplasm of trachea, bronchus and lung: Secondary | ICD-10-CM | POA: Insufficient documentation

## 2022-07-19 DIAGNOSIS — Z87891 Personal history of nicotine dependence: Secondary | ICD-10-CM | POA: Diagnosis not present

## 2022-07-19 DIAGNOSIS — Z1501 Genetic susceptibility to malignant neoplasm of breast: Secondary | ICD-10-CM | POA: Diagnosis not present

## 2022-07-19 DIAGNOSIS — Z9981 Dependence on supplemental oxygen: Secondary | ICD-10-CM | POA: Insufficient documentation

## 2022-07-19 DIAGNOSIS — Z803 Family history of malignant neoplasm of breast: Secondary | ICD-10-CM | POA: Diagnosis not present

## 2022-07-19 LAB — COMPREHENSIVE METABOLIC PANEL
ALT: 11 U/L (ref 0–44)
AST: 20 U/L (ref 15–41)
Albumin: 3.5 g/dL (ref 3.5–5.0)
Alkaline Phosphatase: 65 U/L (ref 38–126)
Anion gap: 8 (ref 5–15)
BUN: 18 mg/dL (ref 8–23)
CO2: 29 mmol/L (ref 22–32)
Calcium: 9.2 mg/dL (ref 8.9–10.3)
Chloride: 103 mmol/L (ref 98–111)
Creatinine, Ser: 0.6 mg/dL (ref 0.44–1.00)
GFR, Estimated: >60 mL/min (ref >60)
Glucose, Bld: 123 mg/dL — ABNORMAL HIGH (ref 70–99)
Potassium: 4.3 mmol/L (ref 3.5–5.1)
Sodium: 140 mmol/L (ref 135–145)
Total Bilirubin: 0.5 mg/dL (ref 0.3–1.2)
Total Protein: 6.6 g/dL (ref 6.5–8.1)

## 2022-07-19 LAB — CBC WITH DIFFERENTIAL/PLATELET
Abs Immature Granulocytes: 0.12 10*3/uL — ABNORMAL HIGH (ref 0.00–0.07)
Basophils Absolute: 0.1 10*3/uL (ref 0.0–0.1)
Basophils Relative: 1 %
Eosinophils Absolute: 0.3 10*3/uL (ref 0.0–0.5)
Eosinophils Relative: 3 %
HCT: 41.5 % (ref 36.0–46.0)
Hemoglobin: 13.3 g/dL (ref 12.0–15.0)
Immature Granulocytes: 1 %
Lymphocytes Relative: 7 %
Lymphs Abs: 0.7 10*3/uL (ref 0.7–4.0)
MCH: 30.9 pg (ref 26.0–34.0)
MCHC: 32 g/dL (ref 30.0–36.0)
MCV: 96.5 fL (ref 80.0–100.0)
Monocytes Absolute: 1.1 10*3/uL — ABNORMAL HIGH (ref 0.1–1.0)
Monocytes Relative: 11 %
Neutro Abs: 8.2 10*3/uL — ABNORMAL HIGH (ref 1.7–7.7)
Neutrophils Relative %: 77 %
Platelets: 210 10*3/uL (ref 150–400)
RBC: 4.3 MIL/uL (ref 3.87–5.11)
RDW: 14.8 % (ref 11.5–15.5)
WBC: 10.5 10*3/uL (ref 4.0–10.5)
nRBC: 0 % (ref 0.0–0.2)

## 2022-07-19 MED ORDER — SODIUM CHLORIDE 0.9 % IV SOLN
Freq: Once | INTRAVENOUS | Status: AC
  Filled 2022-07-19: qty 250

## 2022-07-19 MED ORDER — HEPARIN SOD (PORK) LOCK FLUSH 100 UNIT/ML IV SOLN
500.0000 [IU] | Freq: Once | INTRAVENOUS | Status: AC | PRN
  Administered 2022-07-19: 500 [IU]
  Filled 2022-07-19: qty 5

## 2022-07-19 MED ORDER — SODIUM CHLORIDE 0.9 % IV SOLN
200.0000 mg | Freq: Once | INTRAVENOUS | Status: AC
  Administered 2022-07-19: 200 mg via INTRAVENOUS
  Filled 2022-07-19: qty 8

## 2022-07-19 NOTE — Progress Notes (Signed)
Patient has a 2 lb wt loss with continued decrease in appetite.    Patient has been referred to a cardiologist with provider at [facility] assisted living/[facility].  Was referred for abnormal echo but that appt was cancelled by the facility.  [name] is unaware of when or why echo was performed.

## 2022-07-19 NOTE — Progress Notes (Signed)
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  That incidentally showed right axillary adenopathy and soft tissue in the superior right breast.  This was followed by diagnostic right breast mammogram and ultrasound which showed a 2.3 x 1.6 x 1.2 cm irregular hypoechoic mass in the right breast at the 12 o'clock position 1 cm from the nipple.  Enlarged right axillary lymph node 1.8 cm.  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  Patient has oxygen dependent COPD.     PET CT scan shows FDG avid spiculated nodule with central cavitation in the left upper lobe.  Size an SUV of this nodule was not quantified on the PET scan.  Partially calcified markedly avid right breast lesion with markedly avid right axillary lymph node metastases.  Patient underwent 1 cycle of Taxol chemotherapy while awaiting surgery and underwent final surgery on 12/28/2021.  Final pathology showed18 mm grade 3 invasive mammary carcinoma triple negative. Macrometastases and at least for an micrometastases in 0 lymph nodes.  Largest size of metastatic deposit 30 mm.  Extranodal extension present.  pT1C pN2A   Given age and frailty plan is to proceed  with CarboTaxol chemotherapy alone for 12 cycles.  Beryle Flock was added with cycle 12 of CarboTaxol and plan is to continue 9 cycles of adjuvant Keytruda.  Patient also got adjuvant radiation therapy  Patient was seen by genetic counseling was found to have single pathogenic variant in the MUTYH gene.  This does not increase the risk of breast cancer.  These patients are not affected by MUTYH associated polyposis.    Interval history-patient is tolerating treatments well and denies any complaints at this time other than baseline fatigue and shortness of breath  ECOG PS- 2 Pain scale- 0   Review of systems- Review of Systems  Constitutional:  Positive for malaise/fatigue. Negative for chills, fever and weight loss.  HENT:  Negative for congestion, ear discharge and nosebleeds.   Eyes:  Negative for blurred vision.  Respiratory:  Positive for shortness of breath. Negative for cough, hemoptysis, sputum production and wheezing.   Cardiovascular:  Negative for chest pain, palpitations, orthopnea and claudication.  Gastrointestinal:  Negative for abdominal pain, blood in stool, constipation, diarrhea, heartburn, melena, nausea and vomiting.  Genitourinary:  Negative for dysuria, flank pain, frequency, hematuria and urgency.  Musculoskeletal:  Negative for back pain, joint pain and myalgias.  Skin:  Negative for rash.  Neurological:  Negative for dizziness, tingling, focal weakness, seizures, weakness and headaches.  Endo/Heme/Allergies:  Does not bruise/bleed easily.  Psychiatric/Behavioral:  Negative for depression and suicidal ideas. The patient does not have insomnia.       Allergies  Allergen Reactions   Penicillins Rash and Other (See Comments)     Past Medical History:  Diagnosis Date  Arthritis    Cancer (HCC)    CHF (congestive heart failure) (HCC)    COPD (chronic obstructive pulmonary disease) (HCC)    Dementia (HCC)    Hyperlipidemia      Past Surgical History:   Procedure Laterality Date   BREAST BIOPSY Right 10/14/2021   rt 12:00 [place] bx venus clip path pending   BREAST BIOPSY Right 10/14/2021   rt axilla hydromarker path pending   EYE SURGERY Bilateral 2019   cataract   MASTECTOMY MODIFIED RADICAL Right 12/28/2021   Procedure: MASTECTOMY MODIFIED RADICAL;  Surgeon: Herbert Pun, MD;  Location: [facility] ORS;  Service: General;  Laterality: Right;   PORTACATH PLACEMENT N/A 11/25/2021   Procedure: INSERTION PORT-A-CATH;  Surgeon: Herbert Pun, MD;  Location: [facility] ORS;  Service: General;  Laterality: N/A;    Social History   Socioeconomic History   Marital status: Divorced    Spouse name: Not on file   Number of children: Not on file   Years of education: Not on file   Highest education level: Not on file  Occupational History   Not on file  Tobacco Use   Smoking status: Former    Types: Cigarettes    Quit date: 10/2019    Years since quitting: 2.7    Passive exposure: Past   Smokeless tobacco: Never  Vaping Use   Vaping Use: Never used  Substance and Sexual Activity   Alcohol use: Not Currently   Drug use: Not Currently   Sexual activity: Not Currently  Other Topics Concern   Not on file  Social History Narrative   Not on file   Social Determinants of Health   Financial Resource Strain: Not on file  Food Insecurity: Not on file  Transportation Needs: Unmet Transportation Needs (06/06/2022)   PRAPARE - Hydrologist (Medical): Yes    Lack of Transportation (Non-Medical): Yes  Physical Activity: Not on file  Stress: Not on file  Social Connections: Not on file  Intimate Partner Violence: Not on file    Family History  Problem Relation Age of Onset   Breast cancer Mother        dx 65s   Lung cancer Sister 43     Current Outpatient Medications:    acetaminophen (TYLENOL) 325 MG tablet, Take 650 mg by mouth See admin instructions. Take 2 tablets (650 mg) by mouth 3 times daily  scheduled (0800, 1400 & 2000) & take 2 tablets (650 mg) by mouth up to twice daily as needed for pain., Disp: , Rfl:    albuterol (VENTOLIN HFA) 108 (90 Base) MCG/ACT inhaler, Inhale 2 puffs into the lungs every 6 (six) hours as needed for shortness of breath or wheezing., Disp: , Rfl:    Albuterol Sulfate 2.5 MG/0.5ML NEBU, Inhale into the lungs as needed., Disp: , Rfl:    alendronate (FOSAMAX) 70 MG tablet, Take 70 mg by mouth every Monday., Disp: , Rfl:    ammonium lactate (LAC-HYDRIN) 12 % lotion, Apply 1 application. topically daily. (0800) Lower legs., Disp: , Rfl:    aspirin EC 81 MG tablet, Take 81 mg by mouth daily. Swallow whole., Disp: , Rfl:    atorvastatin (LIPITOR) 20 MG tablet, Take 20 mg by mouth daily. (0800), Disp: , Rfl:    budesonide (PULMICORT) 0.5 MG/2ML nebulizer solution, Inhale 0.5 mg into the lungs daily. (0800), Disp: , Rfl:    calcium carbonate (TUMS EX) 750 MG chewable tablet, Chew 1 tablet by mouth in  the morning and at bedtime. (0800 & 2000), Disp: , Rfl:    Cholecalciferol (VITAMIN D3) 50 MCG (2000 UT) TABS, Take 2,000 Units by mouth daily. (0800), Disp: , Rfl:    INCRUSE ELLIPTA 62.5 MCG/ACT AEPB, Inhale 1 puff into the lungs daily. (0800), Disp: , Rfl:    LUMIGAN 0.01 % SOLN, Place 1 drop into both eyes at bedtime. (2000), Disp: , Rfl:    metoprolol succinate (TOPROL-XL) 25 MG 24 hr tablet, Take 25 mg by mouth daily. (0800) Hold if SBP<100 or Pulse <60., Disp: , Rfl:    Multiple Vitamins-Minerals (HEALTHY EYES SUPERVISION 2 PO), Take 1 capsule by mouth in the morning and at bedtime. (0730 & 1730), Disp: , Rfl:    OXYGEN, Inhale 2 L/min into the lungs as needed (shortness of breath OR if pulse ox is <90% on room air)., Disp: , Rfl:    potassium chloride SA (KLOR-CON M) 20 MEQ tablet, Take 20 mEq by mouth daily. (0800), Disp: , Rfl:    guaiFENesin (MUCINEX) 600 MG 12 hr tablet, Take 600 mg by mouth 2 (two) times daily as needed (congestion.). (Patient not taking:  Reported on 03/07/2022), Disp: , Rfl:    traMADol (ULTRAM) 50 MG tablet, Take 1 tablet (50 mg total) by mouth every 6 (six) hours as needed. (Patient not taking: Reported on 07/14/2022), Disp: 20 tablet, Rfl: 0 No current facility-administered medications for this visit.  Facility-Administered Medications Ordered in Other Visits:    diphenhydrAMINE (BENADRYL) 50 MG/ML injection, , , ,    famotidine (PEPCID) 20-0.9 MG/50ML-% IVPB, , , ,   Physical exam:  Vitals:   07/19/22 1000  BP: 117/74  Pulse: 84  Resp: 16  Temp: 98.1 F (36.7 C)  TempSrc: Tympanic  Weight: 86 lb 3.2 oz (39.1 kg)  Height: '5\' 1"'$  (1.549 m)   Physical Exam Constitutional:      Comments: On home oxygen  Cardiovascular:     Rate and Rhythm: Normal rate and regular rhythm.     Heart sounds: Normal heart sounds.  Pulmonary:     Effort: Pulmonary effort is normal.     Breath sounds: Normal breath sounds.  Skin:    General: Skin is warm and dry.  Neurological:     Mental Status: She is alert and oriented to person, place, and time.         Latest Ref Rng & Units 07/19/2022    9:57 AM  CMP  Glucose 70 - 99 mg/dL 123   BUN 8 - 23 mg/dL 18   Creatinine 0.44 - 1.00 mg/dL 0.60   Sodium 135 - 145 mmol/L 140   Potassium 3.5 - 5.1 mmol/L 4.3   Chloride 98 - 111 mmol/L 103   CO2 22 - 32 mmol/L 29   Calcium 8.9 - 10.3 mg/dL 9.2   Total Protein 6.5 - 8.1 g/dL 6.6   Total Bilirubin 0.3 - 1.2 mg/dL 0.5   Alkaline Phos 38 - 126 U/L 65   AST 15 - 41 U/L 20   ALT 0 - 44 U/L 11       Latest Ref Rng & Units 07/19/2022    9:57 AM  CBC  WBC 4.0 - 10.5 K/uL 10.5   Hemoglobin 12.0 - 15.0 g/dL 13.3   Hematocrit 36.0 - 46.0 % 41.5   Platelets 150 - 400 K/uL 210     Assessment and plan- Patient is a 76 y.o. 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  She is s/p 12 weekly cycles of CarboTaxol chemotherapy.  She is here for on  treatment assessment prior to cycle 7 of adjuvant Keytruda  Counts okay to proceed with cycle 7 of adjuvant Keytruda today.  I will see her back in 3 weeks for cycle 8.  She completes all adjuvant treatment after 9 cycles of Keytruda.   Visit Diagnosis 1. Encounter for antineoplastic immunotherapy   2. Malignant neoplasm of upper-outer quadrant of right breast in female, estrogen receptor negative ([place])      Dr. Randa Evens, MD, MPH [facility] [phone] 07/19/2022 10:48 AM

## 2022-08-05 ENCOUNTER — Other Ambulatory Visit: Payer: Self-pay | Admitting: General Surgery

## 2022-08-05 DIAGNOSIS — Z1231 Encounter for screening mammogram for malignant neoplasm of breast: Secondary | ICD-10-CM

## 2022-08-09 ENCOUNTER — Inpatient Hospital Stay: Payer: Medicare HMO | Attending: Oncology | Admitting: Oncology

## 2022-08-09 ENCOUNTER — Inpatient Hospital Stay: Payer: Medicare HMO

## 2022-08-09 ENCOUNTER — Encounter: Payer: Self-pay | Admitting: Oncology

## 2022-08-09 VITALS — Resp 16

## 2022-08-09 VITALS — BP 120/75 | HR 91 | Temp 99.5°F | Wt 85.5 lb

## 2022-08-09 DIAGNOSIS — Z923 Personal history of irradiation: Secondary | ICD-10-CM | POA: Insufficient documentation

## 2022-08-09 DIAGNOSIS — C50411 Malignant neoplasm of upper-outer quadrant of right female breast: Secondary | ICD-10-CM | POA: Insufficient documentation

## 2022-08-09 DIAGNOSIS — Z87891 Personal history of nicotine dependence: Secondary | ICD-10-CM | POA: Insufficient documentation

## 2022-08-09 DIAGNOSIS — Z79899 Other long term (current) drug therapy: Secondary | ICD-10-CM | POA: Diagnosis not present

## 2022-08-09 DIAGNOSIS — Z171 Estrogen receptor negative status [ER-]: Secondary | ICD-10-CM | POA: Diagnosis not present

## 2022-08-09 DIAGNOSIS — C773 Secondary and unspecified malignant neoplasm of axilla and upper limb lymph nodes: Secondary | ICD-10-CM | POA: Diagnosis present

## 2022-08-09 DIAGNOSIS — Z5112 Encounter for antineoplastic immunotherapy: Secondary | ICD-10-CM | POA: Diagnosis not present

## 2022-08-09 LAB — COMPREHENSIVE METABOLIC PANEL
ALT: 11 U/L (ref 0–44)
AST: 25 U/L (ref 15–41)
Albumin: 3.6 g/dL (ref 3.5–5.0)
Alkaline Phosphatase: 73 U/L (ref 38–126)
Anion gap: 11 (ref 5–15)
BUN: 20 mg/dL (ref 8–23)
CO2: 28 mmol/L (ref 22–32)
Calcium: 9.2 mg/dL (ref 8.9–10.3)
Chloride: 99 mmol/L (ref 98–111)
Creatinine, Ser: 0.67 mg/dL (ref 0.44–1.00)
GFR, Estimated: >60 mL/min (ref >60)
Glucose, Bld: 192 mg/dL — ABNORMAL HIGH (ref 70–99)
Potassium: 3.7 mmol/L (ref 3.5–5.1)
Sodium: 138 mmol/L (ref 135–145)
Total Bilirubin: 0.7 mg/dL (ref 0.3–1.2)
Total Protein: 6.4 g/dL — ABNORMAL LOW (ref 6.5–8.1)

## 2022-08-09 LAB — CBC WITH DIFFERENTIAL/PLATELET
Abs Immature Granulocytes: 0.15 10*3/uL — ABNORMAL HIGH (ref 0.00–0.07)
Basophils Absolute: 0.1 10*3/uL (ref 0.0–0.1)
Basophils Relative: 1 %
Eosinophils Absolute: 0.2 10*3/uL (ref 0.0–0.5)
Eosinophils Relative: 2 %
HCT: 42.1 % (ref 36.0–46.0)
Hemoglobin: 13.7 g/dL (ref 12.0–15.0)
Immature Granulocytes: 2 %
Lymphocytes Relative: 8 %
Lymphs Abs: 0.8 10*3/uL (ref 0.7–4.0)
MCH: 31.2 pg (ref 26.0–34.0)
MCHC: 32.5 g/dL (ref 30.0–36.0)
MCV: 95.9 fL (ref 80.0–100.0)
Monocytes Absolute: 0.7 10*3/uL (ref 0.1–1.0)
Monocytes Relative: 7 %
Neutro Abs: 7.9 10*3/uL — ABNORMAL HIGH (ref 1.7–7.7)
Neutrophils Relative %: 80 %
Platelets: 259 10*3/uL (ref 150–400)
RBC: 4.39 MIL/uL (ref 3.87–5.11)
RDW: 14.5 % (ref 11.5–15.5)
WBC: 9.9 10*3/uL (ref 4.0–10.5)
nRBC: 0 % (ref 0.0–0.2)

## 2022-08-09 MED ORDER — SODIUM CHLORIDE 0.9 % IV SOLN
200.0000 mg | Freq: Once | INTRAVENOUS | Status: AC
  Administered 2022-08-09: 200 mg via INTRAVENOUS
  Filled 2022-08-09: qty 200

## 2022-08-09 MED ORDER — HEPARIN SOD (PORK) LOCK FLUSH 100 UNIT/ML IV SOLN
500.0000 [IU] | Freq: Once | INTRAVENOUS | Status: AC | PRN
  Administered 2022-08-09: 500 [IU]
  Filled 2022-08-09: qty 5

## 2022-08-09 MED ORDER — SODIUM CHLORIDE 0.9 % IV SOLN
Freq: Once | INTRAVENOUS | Status: AC
  Filled 2022-08-09: qty 250

## 2022-08-09 NOTE — Progress Notes (Signed)
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  That incidentally showed right axillary adenopathy and soft tissue in the superior right breast.  This was followed by diagnostic right breast mammogram and ultrasound which showed a 2.3 x 1.6 x 1.2 cm irregular hypoechoic mass in the right breast at the 12 o'clock position 1 cm from the nipple.  Enlarged right axillary lymph node 1.8 cm.  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  Patient has oxygen dependent COPD.     PET CT scan shows FDG avid spiculated nodule with central cavitation in the left upper lobe.  Size an SUV of this nodule was not quantified on the PET scan.  Partially calcified markedly avid right breast lesion with markedly avid right axillary lymph node metastases.  Patient underwent 1 cycle of Taxol chemotherapy while awaiting surgery and underwent final surgery on 12/28/2021.  Final pathology showed18 mm grade 3 invasive mammary carcinoma triple negative. Macrometastases and at least for an micrometastases in 0 lymph nodes.  Largest size of metastatic deposit 30 mm.  Extranodal extension present.  pT1C pN2A   Given age and frailty plan is to proceed  with CarboTaxol chemotherapy alone for 12 cycles.  Beryle Flock was added with cycle 12 of CarboTaxol and plan is to continue 9 cycles of adjuvant Keytruda.  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  This does not increase the risk of breast cancer.  These patients are not affected by MUTYH associated polyposis.    Interval history-no acute issues since last visit.  Denies any nausea vomiting diarrhea cough or fever.  ECOG PS- 2 Pain scale- 0   Review of systems- Review of Systems  Constitutional:  Negative for chills, fever, malaise/fatigue and weight loss.  HENT:  Negative for congestion, ear discharge and nosebleeds.   Eyes:  Negative for blurred vision.  Respiratory:  Positive for shortness of breath. Negative for cough, hemoptysis, sputum production and wheezing.   Cardiovascular:  Negative for chest pain, palpitations, orthopnea and claudication.  Gastrointestinal:  Negative for abdominal pain, blood in stool, constipation, diarrhea, heartburn, melena, nausea and vomiting.  Genitourinary:  Negative for dysuria, flank pain, frequency, hematuria and urgency.  Musculoskeletal:  Negative for back pain, joint pain and myalgias.  Skin:  Negative for rash.  Neurological:  Negative for dizziness, tingling, focal weakness, seizures, weakness and headaches.  Endo/Heme/Allergies:  Does not bruise/bleed easily.  Psychiatric/Behavioral:  Negative for depression and suicidal ideas. The patient does not have insomnia.       Allergies  Allergen Reactions   Penicillins Rash and Other (See Comments)     Past Medical History:  Diagnosis Date   Arthritis  Cancer (HCC)    CHF (congestive heart failure) (HCC)    COPD (chronic obstructive pulmonary disease) (HCC)    Dementia (HCC)    Hyperlipidemia      Past Surgical History:  Procedure Laterality Date   BREAST BIOPSY Right 10/14/2021    rt 12:00 [place] bx venus clip path pending   BREAST BIOPSY Right 10/14/2021   rt axilla hydromarker path pending   EYE SURGERY Bilateral 2019   cataract   MASTECTOMY MODIFIED RADICAL Right 12/28/2021   Procedure: MASTECTOMY MODIFIED RADICAL;  Surgeon: Herbert Pun, MD;  Location: [facility] ORS;  Service: General;  Laterality: Right;   PORTACATH PLACEMENT N/A 11/25/2021   Procedure: INSERTION PORT-A-CATH;  Surgeon: Herbert Pun, MD;  Location: [facility] ORS;  Service: General;  Laterality: N/A;    Social History   Socioeconomic History   Marital status: Divorced    Spouse name: Not on file   Number of children: Not on file   Years of education: Not on file   Highest education level: Not on file  Occupational History   Not on file  Tobacco Use   Smoking status: Former    Types: Cigarettes    Quit date: 10/2019    Years since quitting: 2.7    Passive exposure: Past   Smokeless tobacco: Never  Vaping Use   Vaping Use: Never used  Substance and Sexual Activity   Alcohol use: Not Currently   Drug use: Not Currently   Sexual activity: Not Currently  Other Topics Concern   Not on file  Social History Narrative   Not on file   Social Determinants of Health   Financial Resource Strain: Not on file  Food Insecurity: Not on file  Transportation Needs: Unmet Transportation Needs (06/06/2022)   PRAPARE - Hydrologist (Medical): Yes    Lack of Transportation (Non-Medical): Yes  Physical Activity: Not on file  Stress: Not on file  Social Connections: Not on file  Intimate Partner Violence: Not on file    Family History  Problem Relation Age of Onset   Breast cancer Mother        dx 3s   Lung cancer Sister 71     Current Outpatient Medications:    acetaminophen (TYLENOL) 325 MG tablet, Take 650 mg by mouth See admin instructions. Take 2 tablets (650 mg) by mouth 3 times daily scheduled (0800, 1400 & 2000) & take 2 tablets (650 mg) by mouth  up to twice daily as needed for pain., Disp: , Rfl:    albuterol (VENTOLIN HFA) 108 (90 Base) MCG/ACT inhaler, Inhale 2 puffs into the lungs every 6 (six) hours as needed for shortness of breath or wheezing., Disp: , Rfl:    Albuterol Sulfate 2.5 MG/0.5ML NEBU, Inhale into the lungs as needed., Disp: , Rfl:    alendronate (FOSAMAX) 70 MG tablet, Take 70 mg by mouth every Monday., Disp: , Rfl:    ammonium lactate (LAC-HYDRIN) 12 % lotion, Apply 1 application. topically daily. (0800) Lower legs., Disp: , Rfl:    aspirin EC 81 MG tablet, Take 81 mg by mouth daily. Swallow whole., Disp: , Rfl:    atorvastatin (LIPITOR) 20 MG tablet, Take 20 mg by mouth daily. (0800), Disp: , Rfl:    budesonide (PULMICORT) 0.5 MG/2ML nebulizer solution, Inhale 0.5 mg into the lungs daily. (0800), Disp: , Rfl:    calcium carbonate (TUMS EX) 750 MG chewable tablet, Chew 1 tablet by mouth in the morning and at  bedtime. (0800 & 2000), Disp: , Rfl:    Cholecalciferol (VITAMIN D3) 50 MCG (2000 UT) TABS, Take 2,000 Units by mouth daily. (0800), Disp: , Rfl:    INCRUSE ELLIPTA 62.5 MCG/ACT AEPB, Inhale 1 puff into the lungs daily. (0800), Disp: , Rfl:    LUMIGAN 0.01 % SOLN, Place 1 drop into both eyes at bedtime. (2000), Disp: , Rfl:    metoprolol succinate (TOPROL-XL) 25 MG 24 hr tablet, Take 25 mg by mouth daily. (0800) Hold if SBP<100 or Pulse <60., Disp: , Rfl:    OXYGEN, Inhale 2 L/min into the lungs as needed (shortness of breath OR if pulse ox is <90% on room air)., Disp: , Rfl:    potassium chloride SA (KLOR-CON M) 20 MEQ tablet, Take 20 mEq by mouth daily. (0800), Disp: , Rfl:    guaiFENesin (MUCINEX) 600 MG 12 hr tablet, Take 600 mg by mouth 2 (two) times daily as needed (congestion.). (Patient not taking: Reported on 03/07/2022), Disp: , Rfl:    Multiple Vitamins-Minerals (HEALTHY EYES SUPERVISION 2 PO), Take 1 capsule by mouth in the morning and at bedtime. (0730 & 1730), Disp: , Rfl:    traMADol (ULTRAM) 50 MG  tablet, Take 1 tablet (50 mg total) by mouth every 6 (six) hours as needed. (Patient not taking: Reported on 07/14/2022), Disp: 20 tablet, Rfl: 0 No current facility-administered medications for this visit.  Facility-Administered Medications Ordered in Other Visits:    diphenhydrAMINE (BENADRYL) 50 MG/ML injection, , , ,    famotidine (PEPCID) 20-0.9 MG/50ML-% IVPB, , , ,   Physical exam:  Vitals:   08/09/22 0945  BP: 120/75  Pulse: 91  Temp: 99.5 F (37.5 C)  TempSrc: Tympanic  Weight: 85 lb 8 oz (38.8 kg)   Physical Exam Constitutional:      Comments: Thin elderly frail woman sitting in a wheelchair.  Appears in no acute distress.  She is on home oxygen  Cardiovascular:     Rate and Rhythm: Normal rate and regular rhythm.     Heart sounds: Normal heart sounds.  Pulmonary:     Effort: Pulmonary effort is normal.     Breath sounds: Normal breath sounds.  Abdominal:     General: Bowel sounds are normal.     Palpations: Abdomen is soft.  Skin:    General: Skin is warm and dry.  Neurological:     Mental Status: She is alert and oriented to person, place, and time.         Latest Ref Rng & Units 07/19/2022    9:57 AM  CMP  Glucose 70 - 99 mg/dL 123   BUN 8 - 23 mg/dL 18   Creatinine 0.44 - 1.00 mg/dL 0.60   Sodium 135 - 145 mmol/L 140   Potassium 3.5 - 5.1 mmol/L 4.3   Chloride 98 - 111 mmol/L 103   CO2 22 - 32 mmol/L 29   Calcium 8.9 - 10.3 mg/dL 9.2   Total Protein 6.5 - 8.1 g/dL 6.6   Total Bilirubin 0.3 - 1.2 mg/dL 0.5   Alkaline Phos 38 - 126 U/L 65   AST 15 - 41 U/L 20   ALT 0 - 44 U/L 11       Latest Ref Rng & Units 08/09/2022    9:15 AM  CBC  WBC 4.0 - 10.5 K/uL 9.9   Hemoglobin 12.0 - 15.0 g/dL 13.7   Hematocrit 36.0 - 46.0 % 42.1   Platelets 150 - 400 K/uL  259      Assessment and plan- Patient is a 77 y.o. female with clinical prognostic stage IIIb invasive apocrine  carcinoma of the right breast apocrine variety T2 N1 M0 triple negative.  She is  s/p 12 weekly cycles of CarboTaxol chemotherapy.  She is here for on treatment assessment prior to cycle 8 of adjuvant Keytruda  Counts okay to proceed with cycle 8 of adjuvant Keytruda today.  I will see her back in 3 weeks for cycle 9 which would be her last cycle.  Patient has completed adjuvant radiation treatment as well.  I will plan to get her port out after completing Keytruda and I will see her every 3 months for surveillance visits.   Visit Diagnosis 1. Malignant neoplasm of upper-outer quadrant of right breast in female, estrogen receptor negative ([place])   2. Encounter for antineoplastic immunotherapy      Dr. Randa Evens, MD, MPH [facility] [phone] 08/09/2022 10:35 AM

## 2022-08-09 NOTE — Patient Instructions (Signed)
[facility]  Discharge Instructions: Thank you for choosing [facility] to provide your oncology and hematology care.  If you have a lab appointment with the Cancer Center, please go directly to the Cancer Center and check in at the registration area.  Wear comfortable clothing and clothing appropriate for easy access to any Portacath or PICC line.   We strive to give you quality time with your provider. You may need to reschedule your appointment if you arrive late (15 or more minutes).  Arriving late affects you and other patients whose appointments are after yours.  Also, if you miss three or more appointments without notifying the office, you may be dismissed from the clinic at the provider's discretion.      For prescription refill requests, have your pharmacy contact our office and allow 72 hours for refills to be completed.    To help prevent nausea and vomiting after your treatment, we encourage you to take your nausea medication as directed.  BELOW ARE SYMPTOMS THAT SHOULD BE REPORTED IMMEDIATELY: *FEVER GREATER THAN 100.4 F (38 C) OR HIGHER *CHILLS OR SWEATING *NAUSEA AND VOMITING THAT IS NOT CONTROLLED WITH YOUR NAUSEA MEDICATION *UNUSUAL SHORTNESS OF BREATH *UNUSUAL BRUISING OR BLEEDING *URINARY PROBLEMS (pain or burning when urinating, or frequent urination) *BOWEL PROBLEMS (unusual diarrhea, constipation, pain near the anus) TENDERNESS IN MOUTH AND THROAT WITH OR WITHOUT PRESENCE OF ULCERS (sore throat, sores in mouth, or a toothache) UNUSUAL RASH, SWELLING OR PAIN  UNUSUAL VAGINAL DISCHARGE OR ITCHING   Items with * indicate a potential emergency and should be followed up as soon as possible or go to the Emergency Department if any problems should occur.  Please show the CHEMOTHERAPY ALERT CARD or IMMUNOTHERAPY ALERT CARD at check-in to the Emergency Department and triage nurse.  Should you have questions after your visit or  need to cancel or reschedule your appointment, please contact [facility]  [phone] and follow the prompts.  Office hours are 8:00 a.m. to 4:30 p.m. Monday - Friday. Please note that voicemails left after 4:00 p.m. may not be returned until the following business day.  We are closed weekends and major holidays. You have access to a nurse at all times for urgent questions. Please call the main number to the clinic [phone] and follow the prompts.  For any non-urgent questions, you may also contact your provider using MyChart. We now offer e-Visits for anyone 18 and older to request care online for non-urgent symptoms. For details visit [email or web address].   Also download the MyChart app! Go to the app store, search "MyChart", open the app, select [facility], and log in with your MyChart username and password.    

## 2022-08-29 ENCOUNTER — Other Ambulatory Visit: Payer: Self-pay

## 2022-08-30 ENCOUNTER — Inpatient Hospital Stay (HOSPITAL_BASED_OUTPATIENT_CLINIC_OR_DEPARTMENT_OTHER): Payer: Medicare HMO | Admitting: Oncology

## 2022-08-30 ENCOUNTER — Inpatient Hospital Stay: Payer: Medicare HMO

## 2022-08-30 ENCOUNTER — Encounter: Payer: Self-pay | Admitting: Oncology

## 2022-08-30 VITALS — BP 110/67 | HR 84 | Temp 98.7°F | Resp 16 | Ht 61.0 in | Wt 86.4 lb

## 2022-08-30 DIAGNOSIS — Z171 Estrogen receptor negative status [ER-]: Secondary | ICD-10-CM

## 2022-08-30 DIAGNOSIS — C50411 Malignant neoplasm of upper-outer quadrant of right female breast: Secondary | ICD-10-CM

## 2022-08-30 DIAGNOSIS — Z5112 Encounter for antineoplastic immunotherapy: Secondary | ICD-10-CM | POA: Diagnosis not present

## 2022-08-30 LAB — CBC WITH DIFFERENTIAL/PLATELET
Abs Immature Granulocytes: 0.1 10*3/uL — ABNORMAL HIGH (ref 0.00–0.07)
Basophils Absolute: 0.1 10*3/uL (ref 0.0–0.1)
Basophils Relative: 1 %
Eosinophils Absolute: 0.3 10*3/uL (ref 0.0–0.5)
Eosinophils Relative: 2 %
HCT: 41.8 % (ref 36.0–46.0)
Hemoglobin: 13.4 g/dL (ref 12.0–15.0)
Immature Granulocytes: 1 %
Lymphocytes Relative: 9 %
Lymphs Abs: 1.1 10*3/uL (ref 0.7–4.0)
MCH: 31.4 pg (ref 26.0–34.0)
MCHC: 32.1 g/dL (ref 30.0–36.0)
MCV: 97.9 fL (ref 80.0–100.0)
Monocytes Absolute: 1 10*3/uL (ref 0.1–1.0)
Monocytes Relative: 8 %
Neutro Abs: 9.9 10*3/uL — ABNORMAL HIGH (ref 1.7–7.7)
Neutrophils Relative %: 79 %
Platelets: 275 10*3/uL (ref 150–400)
RBC: 4.27 MIL/uL (ref 3.87–5.11)
RDW: 14.2 % (ref 11.5–15.5)
WBC: 12.5 10*3/uL — ABNORMAL HIGH (ref 4.0–10.5)
nRBC: 0 % (ref 0.0–0.2)

## 2022-08-30 LAB — COMPREHENSIVE METABOLIC PANEL
ALT: 12 U/L (ref 0–44)
AST: 23 U/L (ref 15–41)
Albumin: 3.7 g/dL (ref 3.5–5.0)
Alkaline Phosphatase: 78 U/L (ref 38–126)
Anion gap: 11 (ref 5–15)
BUN: 21 mg/dL (ref 8–23)
CO2: 29 mmol/L (ref 22–32)
Calcium: 9.8 mg/dL (ref 8.9–10.3)
Chloride: 99 mmol/L (ref 98–111)
Creatinine, Ser: 0.57 mg/dL (ref 0.44–1.00)
GFR, Estimated: >60 mL/min (ref >60)
Glucose, Bld: 143 mg/dL — ABNORMAL HIGH (ref 70–99)
Potassium: 3.9 mmol/L (ref 3.5–5.1)
Sodium: 139 mmol/L (ref 135–145)
Total Bilirubin: 0.5 mg/dL (ref 0.3–1.2)
Total Protein: 6.7 g/dL (ref 6.5–8.1)

## 2022-08-30 LAB — TSH: TSH: 2.824 u[IU]/mL (ref 0.350–4.500)

## 2022-08-30 MED ORDER — SODIUM CHLORIDE 0.9 % IV SOLN
Freq: Once | INTRAVENOUS | Status: AC
  Filled 2022-08-30: qty 250

## 2022-08-30 MED ORDER — HEPARIN SOD (PORK) LOCK FLUSH 100 UNIT/ML IV SOLN
500.0000 [IU] | Freq: Once | INTRAVENOUS | Status: AC | PRN
  Administered 2022-08-30: 500 [IU]
  Filled 2022-08-30: qty 5

## 2022-08-30 MED ORDER — SODIUM CHLORIDE 0.9 % IV SOLN
200.0000 mg | Freq: Once | INTRAVENOUS | Status: AC
  Administered 2022-08-30: 200 mg via INTRAVENOUS
  Filled 2022-08-30: qty 200

## 2022-08-30 NOTE — Progress Notes (Signed)
Nutrition Assessment   Reason for Assessment:   Patient identified on Malnutrition screening report for weight loss   ASSESSMENT:  77 year old female with stage IIIb triple negative breast cancer.  Past medical history of CHF, COPD, dementia, HLD.  Patient receiving Beryle Flock currently.  S/p carbo/taxol 12 cycles and radiation.    Met with patient and son during infusion. Son reports that appetite decreased during radiation but has picked recently.  Patient resides at assisted living facility ([facility]).  Meals are prepared for her daily.  She has access to snacks as well.  Patient does not like sweet things and is unable to drink oral nutrition supplements.  Reports that she likes most foods at facility.  Family takes her out to eat sometimes and she always get chicken tenders, mashed potatoes and fried okra. Patient reports that if she is served 3 items at facility she will eat at least 2 of them.      Medications: Tums, magnesium, KCL   Labs: glucose 143   Anthropometrics:   Height: 61 inches Weight: 86 lb today 94 lb on 9/5 BMI: 16  8% weight loss in the last 4 months   Estimated Energy Needs  Kcals: 1610-9604 Protein: 60-68 g Fluid: 1200-1365 ml   NUTRITION DIAGNOSIS: Inadequate oral intake related to cancer and related treatment side effects as evidenced by 8% weight loss in the last 4 months and decreased appetite during radiation, improved recently   INTERVENTION:  Recommend easy access to savory snacks with protein in room. Discussed some options with patient and family (trail mix, laughing cow cheese and crackers, nabs). Patient does not have refrigerator in room and does not like sweet things. Encouraged snacks between meals Contact information provided    MONITORING, EVALUATION, GOAL: weight trends, intake   Next Visit: no follow-up RD available if needed  Kumiko Fishman B. Zenia Resides, [place], [place] Registered Dietitian [phone]

## 2022-08-30 NOTE — Patient Instructions (Signed)
[facility]  Discharge Instructions: Thank you for choosing [facility] to provide your oncology and hematology care.  If you have a lab appointment with the [facility], please go directly to the [facility] and check in at the registration area.  Wear comfortable clothing and clothing appropriate for easy access to any Portacath or PICC line.   We strive to give you quality time with your provider. You may need to reschedule your appointment if you arrive late (15 or more minutes).  Arriving late affects you and other patients whose appointments are after yours.  Also, if you miss three or more appointments without notifying the office, you may be dismissed from the clinic at the provider's discretion.      For prescription refill requests, have your pharmacy contact our office and allow 72 hours for refills to be completed.    Today you received the following chemotherapy and/or immunotherapy agents KEYTRUDA      To help prevent nausea and vomiting after your treatment, we encourage you to take your nausea medication as directed.  BELOW ARE SYMPTOMS THAT SHOULD BE REPORTED IMMEDIATELY: *FEVER GREATER THAN 100.4 F (38 C) OR HIGHER *CHILLS OR SWEATING *NAUSEA AND VOMITING THAT IS NOT CONTROLLED WITH YOUR NAUSEA MEDICATION *UNUSUAL SHORTNESS OF BREATH *UNUSUAL BRUISING OR BLEEDING *URINARY PROBLEMS (pain or burning when urinating, or frequent urination) *BOWEL PROBLEMS (unusual diarrhea, constipation, pain near the anus) TENDERNESS IN MOUTH AND THROAT WITH OR WITHOUT PRESENCE OF ULCERS (sore throat, sores in mouth, or a toothache) UNUSUAL RASH, SWELLING OR PAIN  UNUSUAL VAGINAL DISCHARGE OR ITCHING   Items with * indicate a potential emergency and should be followed up as soon as possible or go to the Emergency Department if any problems should occur.  Please show the CHEMOTHERAPY ALERT CARD or IMMUNOTHERAPY ALERT CARD at check-in to  the Emergency Department and triage nurse.  Should you have questions after your visit or need to cancel or reschedule your appointment, please contact [facility]  [phone] and follow the prompts.  Office hours are 8:00 a.m. to 4:30 p.m. Monday - Friday. Please note that voicemails left after 4:00 p.m. may not be returned until the following business day.  We are closed weekends and major holidays. You have access to a nurse at all times for urgent questions. Please call the main number to the clinic [phone] and follow the prompts.  For any non-urgent questions, you may also contact your provider using MyChart. We now offer e-Visits for anyone 62 and older to request care online for non-urgent symptoms. For details visit [email or web address].   Also download the MyChart app! Go to the app store, search "MyChart", open the app, select [facility], and log in with your MyChart username and password.  Pembrolizumab Injection What is this medication? PEMBROLIZUMAB (PEM broe LIZ ue mab) treats some types of cancer. It works by helping your immune system slow or stop the spread of cancer cells. It is a monoclonal antibody. This medicine may be used for other purposes; ask your health care provider or pharmacist if you have questions. COMMON BRAND NAME(S): Keytruda What should I tell my care team before I take this medication? They need to know if you have any of these conditions: Allogeneic stem cell transplant (uses someone else's stem cells) Autoimmune diseases, such as Crohn disease, ulcerative colitis, lupus History of chest radiation Nervous system problems, such as Guillain-Barre syndrome, myasthenia gravis  Organ transplant An unusual or allergic reaction to pembrolizumab, other medications, foods, dyes, or preservatives Pregnant or trying to get pregnant Breast-feeding How should I use this medication? This medication is injected into a vein. It  is given by your care team in a hospital or clinic setting. A special MedGuide will be given to you before each treatment. Be sure to read this information carefully each time. Talk to your care team about the use of this medication in children. While it may be prescribed for children as young as 6 months for selected conditions, precautions do apply. Overdosage: If you think you have taken too much of this medicine contact a poison control center or emergency room at once. NOTE: This medicine is only for you. Do not share this medicine with others. What if I miss a dose? Keep appointments for follow-up doses. It is important not to miss your dose. Call your care team if you are unable to keep an appointment. What may interact with this medication? Interactions have not been studied. This list may not describe all possible interactions. Give your health care provider a list of all the medicines, herbs, non-prescription drugs, or dietary supplements you use. Also tell them if you smoke, drink alcohol, or use illegal drugs. Some items may interact with your medicine. What should I watch for while using this medication? Your condition will be monitored carefully while you are receiving this medication. You may need blood work while taking this medication. This medication may cause serious skin reactions. They can happen weeks to months after starting the medication. Contact your care team right away if you notice fevers or flu-like symptoms with a rash. The rash may be red or purple and then turn into blisters or peeling of the skin. You may also notice a red rash with swelling of the face, lips, or lymph nodes in your neck or under your arms. Tell your care team right away if you have any change in your eyesight. Talk to your care team if you may be pregnant. Serious birth defects can occur if you take this medication during pregnancy and for 4 months after the last dose. You will need a negative  pregnancy test before starting this medication. Contraception is recommended while taking this medication and for 4 months after the last dose. Your care team can help you find the option that works for you. Do not breastfeed while taking this medication and for 4 months after the last dose. What side effects may I notice from receiving this medication? Side effects that you should report to your care team as soon as possible: Allergic reactions--skin rash, itching, hives, swelling of the face, lips, tongue, or throat Dry cough, shortness of breath or trouble breathing Eye pain, redness, irritation, or discharge with blurry or decreased vision Heart muscle inflammation--unusual weakness or fatigue, shortness of breath, chest pain, fast or irregular heartbeat, dizziness, swelling of the ankles, feet, or hands Hormone gland problems--headache, sensitivity to light, unusual weakness or fatigue, dizziness, fast or irregular heartbeat, increased sensitivity to cold or heat, excessive sweating, constipation, hair loss, increased thirst or amount of urine, tremors or shaking, irritability Infusion reactions--chest pain, shortness of breath or trouble breathing, feeling faint or lightheaded Kidney injury (glomerulonephritis)--decrease in the amount of urine, red or dark brown urine, foamy or bubbly urine, swelling of the ankles, hands, or feet Liver injury--right upper belly pain, loss of appetite, nausea, light-colored stool, dark yellow or brown urine, yellowing skin or eyes, unusual weakness  weakness or fatigue Pain, tingling, or numbness in the hands or feet, muscle weakness, change in vision, confusion or trouble speaking, loss of balance or coordination, trouble walking, seizures Rash, fever, and swollen lymph nodes Redness, blistering, peeling, or loosening of the skin, including inside the mouth Sudden or severe stomach pain, bloody diarrhea, fever, nausea, vomiting Side effects that usually do not require  medical attention (report to your care team if they continue or are bothersome): Bone, joint, or muscle pain Diarrhea Fatigue Loss of appetite Nausea Skin rash This list may not describe all possible side effects. Call your doctor for medical advice about side effects. You may report side effects to FDA at 1-800-FDA-1088. Where should I keep my medication? This medication is given in a hospital or clinic. It will not be stored at home. NOTE: This sheet is a summary. It may not cover all possible information. If you have questions about this medicine, talk to your doctor, pharmacist, or health care provider.  2023 Elsevier/Gold Standard (2013-04-08 00:00:00)   

## 2022-09-01 LAB — T4: T4, Total: 5.8 ug/dL (ref 4.5–12.0)

## 2022-09-04 ENCOUNTER — Encounter: Payer: Self-pay | Admitting: Oncology

## 2022-09-04 NOTE — Progress Notes (Signed)
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  That incidentally showed right axillary adenopathy and soft tissue in the superior right breast.  This was followed by diagnostic right breast mammogram and ultrasound which showed a 2.3 x 1.6 x 1.2 cm irregular hypoechoic mass in the right breast at the 12 o'clock position 1 cm from the nipple.  Enlarged right axillary lymph node 1.8 cm.  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  Patient has oxygen dependent COPD.     PET CT scan shows FDG avid spiculated nodule with central cavitation in the left upper lobe.  Size an SUV of this nodule was not quantified on the PET scan.  Partially calcified markedly avid right breast lesion with markedly avid right axillary lymph node metastases.  Patient underwent 1 cycle of Taxol chemotherapy while awaiting surgery and underwent final surgery on 12/28/2021.  Final pathology showed18 mm grade 3 invasive mammary carcinoma triple negative. Macrometastases and at least for an micrometastases in 0 lymph nodes.  Largest size of metastatic deposit 30 mm.  Extranodal extension present.  pT1C pN2A   Given age and frailty plan is to proceed  with CarboTaxol chemotherapy alone for 12 cycles.  Beryle Flock was added with cycle 12 of CarboTaxol and plan is to continue 9 cycles of adjuvant Keytruda.  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  This does not increase the risk of breast cancer.  These patients are not affected by MUTYH associated polyposis.  Interval history-tolerating treatments well so far.  Denies any side effects from [facility].  ECOG PS- 2 Pain scale- 0  Review of systems- Review of Systems  Constitutional:  Negative for chills, fever, malaise/fatigue and weight loss.  HENT:  Negative for congestion, ear discharge and nosebleeds.   Eyes:  Negative for blurred vision.  Respiratory:  Negative for cough, hemoptysis, sputum production, shortness of breath and wheezing.   Cardiovascular:  Negative for chest pain, palpitations, orthopnea and claudication.  Gastrointestinal:  Negative for abdominal pain, blood in stool, constipation, diarrhea, heartburn, melena, nausea and vomiting.  Genitourinary:  Negative for dysuria, flank pain, frequency, hematuria and urgency.  Musculoskeletal:  Negative for back pain, joint pain and myalgias.  Skin:  Negative for rash.  Neurological:  Negative for dizziness, tingling, focal weakness, seizures, weakness and headaches.  Endo/Heme/Allergies:  Does not bruise/bleed easily.  Psychiatric/Behavioral:  Negative for depression and suicidal ideas. The patient does not have insomnia.       Allergies  Allergen Reactions   Penicillins Rash and Other (See Comments)     Past Medical History:  Diagnosis Date   Arthritis    Cancer ([place])    CHF (  congestive heart failure) (HCC)    COPD (chronic obstructive pulmonary disease) (HCC)    Dementia (HCC)    Hyperlipidemia      Past Surgical History:  Procedure Laterality Date   BREAST BIOPSY Right 10/14/2021   rt 12:00 [place] bx venus clip  path pending   BREAST BIOPSY Right 10/14/2021   rt axilla hydromarker path pending   EYE SURGERY Bilateral 2019   cataract   MASTECTOMY MODIFIED RADICAL Right 12/28/2021   Procedure: MASTECTOMY MODIFIED RADICAL;  Surgeon: Herbert Pun, MD;  Location: [facility] ORS;  Service: General;  Laterality: Right;   PORTACATH PLACEMENT N/A 11/25/2021   Procedure: INSERTION PORT-A-CATH;  Surgeon: Herbert Pun, MD;  Location: [facility] ORS;  Service: General;  Laterality: N/A;    Social History   Socioeconomic History   Marital status: Divorced    Spouse name: Not on file   Number of children: Not on file   Years of education: Not on file   Highest education level: Not on file  Occupational History   Not on file  Tobacco Use   Smoking status: Former    Types: Cigarettes    Quit date: 10/2019    Years since quitting: 2.8    Passive exposure: Past   Smokeless tobacco: Never  Vaping Use   Vaping Use: Never used  Substance and Sexual Activity   Alcohol use: Not Currently   Drug use: Not Currently   Sexual activity: Not Currently  Other Topics Concern   Not on file  Social History Narrative   Not on file   Social Determinants of Health   Financial Resource Strain: Not on file  Food Insecurity: Not on file  Transportation Needs: Unmet Transportation Needs (06/06/2022)   PRAPARE - Hydrologist (Medical): Yes    Lack of Transportation (Non-Medical): Yes  Physical Activity: Not on file  Stress: Not on file  Social Connections: Not on file  Intimate Partner Violence: Not on file    Family History  Problem Relation Age of Onset   Breast cancer Mother        dx 74s   Lung cancer Sister 84     Current Outpatient Medications:    acetaminophen (TYLENOL) 325 MG tablet, Take 650 mg by mouth See admin instructions. Take 2 tablets (650 mg) by mouth 3 times daily scheduled (0800, 1400 & 2000) & take 2 tablets (650 mg) by mouth up to twice daily as needed  for pain., Disp: , Rfl:    albuterol (VENTOLIN HFA) 108 (90 Base) MCG/ACT inhaler, Inhale 2 puffs into the lungs every 6 (six) hours as needed for shortness of breath or wheezing., Disp: , Rfl:    Albuterol Sulfate 2.5 MG/0.5ML NEBU, Inhale into the lungs as needed., Disp: , Rfl:    alendronate (FOSAMAX) 70 MG tablet, Take 70 mg by mouth every Monday., Disp: , Rfl:    ammonium lactate (LAC-HYDRIN) 12 % lotion, Apply 1 application. topically daily. (0800) Lower legs., Disp: , Rfl:    aspirin EC 81 MG tablet, Take 81 mg by mouth daily. Swallow whole., Disp: , Rfl:    atorvastatin (LIPITOR) 20 MG tablet, Take 20 mg by mouth daily. (0800), Disp: , Rfl:    budesonide (PULMICORT) 0.5 MG/2ML nebulizer solution, Inhale 0.5 mg into the lungs daily. (0800), Disp: , Rfl:    calcium carbonate (TUMS EX) 750 MG chewable tablet, Chew 1 tablet by mouth in the morning and at bedtime. (0800 & 2000), Disp: ,  Rfl:    Cholecalciferol (VITAMIN D3) 50 MCG (2000 UT) TABS, Take 2,000 Units by mouth daily. (0800), Disp: , Rfl:    INCRUSE ELLIPTA 62.5 MCG/ACT AEPB, Inhale 1 puff into the lungs daily. (0800), Disp: , Rfl:    LUMIGAN 0.01 % SOLN, Place 1 drop into both eyes at bedtime. (2000), Disp: , Rfl:    Magnesium 125 MG CAPS, Take 1 capsule by mouth daily., Disp: , Rfl:    OXYGEN, Inhale 2 L/min into the lungs as needed (shortness of breath OR if pulse ox is <90% on room air)., Disp: , Rfl:    potassium chloride SA (KLOR-CON M) 20 MEQ tablet, Take 20 mEq by mouth daily. (0800), Disp: , Rfl:    guaiFENesin (MUCINEX) 600 MG 12 hr tablet, Take 600 mg by mouth 2 (two) times daily as needed (congestion.). (Patient not taking: Reported on 03/07/2022), Disp: , Rfl:    metoprolol succinate (TOPROL-XL) 25 MG 24 hr tablet, Take 25 mg by mouth daily. (0800) Hold if SBP<100 or Pulse <60. (Patient not taking: Reported on 08/30/2022), Disp: , Rfl:    Multiple Vitamins-Minerals (HEALTHY EYES SUPERVISION 2 PO), Take 1 capsule by mouth in  the morning and at bedtime. (0730 & 1730), Disp: , Rfl:  No current facility-administered medications for this visit.  Facility-Administered Medications Ordered in Other Visits:    diphenhydrAMINE (BENADRYL) 50 MG/ML injection, , , ,    famotidine (PEPCID) 20-0.9 MG/50ML-% IVPB, , , ,   Physical exam:  Vitals:   08/30/22 1413  BP: 110/67  Pulse: 84  Resp: 16  Temp: 98.7 F (37.1 C)  TempSrc: Oral  Weight: 86 lb 6.4 oz (39.2 kg)  Height: '5\' 1"'$  (1.549 m)   Physical Exam Constitutional:      Comments: Thin elderly female sitting in a wheelchair.  She is on home oxygen  Cardiovascular:     Rate and Rhythm: Normal rate and regular rhythm.     Heart sounds: Normal heart sounds.  Pulmonary:     Effort: Pulmonary effort is normal.     Breath sounds: Normal breath sounds.  Abdominal:     General: Bowel sounds are normal.     Palpations: Abdomen is soft.  Skin:    General: Skin is warm and dry.  Neurological:     Mental Status: She is alert and oriented to person, place, and time.         Latest Ref Rng & Units 08/30/2022    1:04 PM  CMP  Glucose 70 - 99 mg/dL 143   BUN 8 - 23 mg/dL 21   Creatinine 0.44 - 1.00 mg/dL 0.57   Sodium 135 - 145 mmol/L 139   Potassium 3.5 - 5.1 mmol/L 3.9   Chloride 98 - 111 mmol/L 99   CO2 22 - 32 mmol/L 29   Calcium 8.9 - 10.3 mg/dL 9.8   Total Protein 6.5 - 8.1 g/dL 6.7   Total Bilirubin 0.3 - 1.2 mg/dL 0.5   Alkaline Phos 38 - 126 U/L 78   AST 15 - 41 U/L 23   ALT 0 - 44 U/L 12       Latest Ref Rng & Units 08/30/2022    1:04 PM  CBC  WBC 4.0 - 10.5 K/uL 12.5   Hemoglobin 12.0 - 15.0 g/dL 13.4   Hematocrit 36.0 - 46.0 % 41.8   Platelets 150 - 400 K/uL 275      Assessment and plan- Patient is a 77 y.o.  female with clinical prognostic stage IIIb invasive apocrine  carcinoma of the right breast apocrine variety T2 N1 M0 triple negative.  She is s/p 12 weekly cycles of CarboTaxol chemotherapy.  Here for on treatment assessment prior to  cycle 9 of adjuvant Keytruda  Counts okay to proceed with cycle 9 of adjuvant Keytruda today which is her last treatment for now.  She has completed adjuvant radiation.  Will plan on getting her port out at this time.  I will see her back in 3 months with labs   Visit Diagnosis 1. Encounter for antineoplastic immunotherapy   2. Malignant neoplasm of upper-outer quadrant of right breast in female, estrogen receptor negative ([place])      Dr. Randa Evens, MD, MPH [facility] at [facility] [phone] 09/04/2022 2:32 PM

## 2022-09-29 ENCOUNTER — Ambulatory Visit
Admission: RE | Admit: 2022-09-29 | Discharge: 2022-09-29 | Disposition: A | Discharge status: Home / Self Care | Payer: Medicare HMO | Source: Ambulatory Visit | Attending: General Surgery | Admitting: General Surgery

## 2022-09-29 DIAGNOSIS — Z1231 Encounter for screening mammogram for malignant neoplasm of breast: Secondary | ICD-10-CM | POA: Insufficient documentation

## 2022-11-28 ENCOUNTER — Emergency Department: Payer: Medicare HMO

## 2022-11-28 ENCOUNTER — Other Ambulatory Visit: Payer: Self-pay

## 2022-11-28 ENCOUNTER — Inpatient Hospital Stay
Admission: EM | Admit: 2022-11-28 | Discharge: 2022-12-05 | DRG: 163 | Disposition: A | Discharge status: Skilled Nursing Facility | Payer: Medicare HMO | Attending: Internal Medicine | Admitting: Internal Medicine

## 2022-11-28 DIAGNOSIS — I5032 Chronic diastolic (congestive) heart failure: Secondary | ICD-10-CM | POA: Diagnosis present

## 2022-11-28 DIAGNOSIS — E785 Hyperlipidemia, unspecified: Secondary | ICD-10-CM | POA: Diagnosis present

## 2022-11-28 DIAGNOSIS — Z7901 Long term (current) use of anticoagulants: Secondary | ICD-10-CM

## 2022-11-28 DIAGNOSIS — T45515A Adverse effect of anticoagulants, initial encounter: Secondary | ICD-10-CM | POA: Diagnosis not present

## 2022-11-28 DIAGNOSIS — I2609 Other pulmonary embolism with acute cor pulmonale: Secondary | ICD-10-CM | POA: Diagnosis not present

## 2022-11-28 DIAGNOSIS — Z681 Body mass index (BMI) 19 or less, adult: Secondary | ICD-10-CM

## 2022-11-28 DIAGNOSIS — I509 Heart failure, unspecified: Secondary | ICD-10-CM

## 2022-11-28 DIAGNOSIS — Z7983 Long term (current) use of bisphosphonates: Secondary | ICD-10-CM

## 2022-11-28 DIAGNOSIS — D6859 Other primary thrombophilia: Secondary | ICD-10-CM | POA: Diagnosis present

## 2022-11-28 DIAGNOSIS — J90 Pleural effusion, not elsewhere classified: Secondary | ICD-10-CM | POA: Diagnosis present

## 2022-11-28 DIAGNOSIS — F03B0 Unspecified dementia, moderate, without behavioral disturbance, psychotic disturbance, mood disturbance, and anxiety: Secondary | ICD-10-CM | POA: Diagnosis present

## 2022-11-28 DIAGNOSIS — Z9221 Personal history of antineoplastic chemotherapy: Secondary | ICD-10-CM

## 2022-11-28 DIAGNOSIS — I110 Hypertensive heart disease with heart failure: Secondary | ICD-10-CM | POA: Diagnosis present

## 2022-11-28 DIAGNOSIS — E119 Type 2 diabetes mellitus without complications: Secondary | ICD-10-CM

## 2022-11-28 DIAGNOSIS — E1151 Type 2 diabetes mellitus with diabetic peripheral angiopathy without gangrene: Secondary | ICD-10-CM | POA: Diagnosis present

## 2022-11-28 DIAGNOSIS — Z79899 Other long term (current) drug therapy: Secondary | ICD-10-CM

## 2022-11-28 DIAGNOSIS — Z923 Personal history of irradiation: Secondary | ICD-10-CM

## 2022-11-28 DIAGNOSIS — Z7722 Contact with and (suspected) exposure to environmental tobacco smoke (acute) (chronic): Secondary | ICD-10-CM | POA: Diagnosis present

## 2022-11-28 DIAGNOSIS — I272 Pulmonary hypertension, unspecified: Secondary | ICD-10-CM | POA: Diagnosis present

## 2022-11-28 DIAGNOSIS — J449 Chronic obstructive pulmonary disease, unspecified: Secondary | ICD-10-CM | POA: Diagnosis present

## 2022-11-28 DIAGNOSIS — M1990 Unspecified osteoarthritis, unspecified site: Secondary | ICD-10-CM | POA: Diagnosis present

## 2022-11-28 DIAGNOSIS — J9621 Acute and chronic respiratory failure with hypoxia: Secondary | ICD-10-CM

## 2022-11-28 DIAGNOSIS — Z880 Allergy status to penicillin: Secondary | ICD-10-CM

## 2022-11-28 DIAGNOSIS — Z87891 Personal history of nicotine dependence: Secondary | ICD-10-CM

## 2022-11-28 DIAGNOSIS — R64 Cachexia: Secondary | ICD-10-CM | POA: Diagnosis present

## 2022-11-28 DIAGNOSIS — E43 Unspecified severe protein-calorie malnutrition: Secondary | ICD-10-CM | POA: Insufficient documentation

## 2022-11-28 DIAGNOSIS — Z803 Family history of malignant neoplasm of breast: Secondary | ICD-10-CM

## 2022-11-28 DIAGNOSIS — I959 Hypotension, unspecified: Secondary | ICD-10-CM | POA: Diagnosis not present

## 2022-11-28 DIAGNOSIS — J439 Emphysema, unspecified: Secondary | ICD-10-CM | POA: Diagnosis present

## 2022-11-28 DIAGNOSIS — J9811 Atelectasis: Secondary | ICD-10-CM | POA: Diagnosis present

## 2022-11-28 DIAGNOSIS — Z9011 Acquired absence of right breast and nipple: Secondary | ICD-10-CM

## 2022-11-28 DIAGNOSIS — R310 Gross hematuria: Secondary | ICD-10-CM | POA: Diagnosis not present

## 2022-11-28 DIAGNOSIS — C50919 Malignant neoplasm of unspecified site of unspecified female breast: Secondary | ICD-10-CM | POA: Diagnosis present

## 2022-11-28 DIAGNOSIS — I2699 Other pulmonary embolism without acute cor pulmonale: Secondary | ICD-10-CM

## 2022-11-28 DIAGNOSIS — Z9981 Dependence on supplemental oxygen: Secondary | ICD-10-CM

## 2022-11-28 DIAGNOSIS — E46 Unspecified protein-calorie malnutrition: Secondary | ICD-10-CM | POA: Diagnosis present

## 2022-11-28 LAB — CBC
HCT: 42.6 % (ref 36.0–46.0)
Hemoglobin: 13.2 g/dL (ref 12.0–15.0)
MCH: 31 pg (ref 26.0–34.0)
MCHC: 31 g/dL (ref 30.0–36.0)
MCV: 100 fL (ref 80.0–100.0)
Platelets: 245 10*3/uL (ref 150–400)
RBC: 4.26 MIL/uL (ref 3.87–5.11)
RDW: 15.1 % (ref 11.5–15.5)
WBC: 11.8 10*3/uL — ABNORMAL HIGH (ref 4.0–10.5)
nRBC: 0 % (ref 0.0–0.2)

## 2022-11-28 LAB — BLOOD GAS, VENOUS: pH, Ven: 7.45 — ABNORMAL HIGH (ref 7.25–7.43)

## 2022-11-28 LAB — BASIC METABOLIC PANEL
Anion gap: 9 (ref 5–15)
BUN: 20 mg/dL (ref 8–23)
CO2: 31 mmol/L (ref 22–32)
Calcium: 9.6 mg/dL (ref 8.9–10.3)
Chloride: 100 mmol/L (ref 98–111)
Creatinine, Ser: 0.77 mg/dL (ref 0.44–1.00)
GFR, Estimated: >60 mL/min (ref >60)
Glucose, Bld: 111 mg/dL — ABNORMAL HIGH (ref 70–99)
Potassium: 3.9 mmol/L (ref 3.5–5.1)
Sodium: 140 mmol/L (ref 135–145)

## 2022-11-28 LAB — LACTIC ACID, PLASMA: Lactic Acid, Venous: 1.2 mmol/L (ref 0.5–1.9)

## 2022-11-28 MED ORDER — IOHEXOL 350 MG/ML SOLN
75.0000 mL | Freq: Once | INTRAVENOUS | Status: AC | PRN
  Administered 2022-11-28: 75 mL via INTRAVENOUS

## 2022-11-28 MED ORDER — HEPARIN (PORCINE) 25000 UT/250ML-% IV SOLN
650.0000 [IU]/h | INTRAVENOUS | Status: DC
  Administered 2022-11-28: 600 [IU]/h via INTRAVENOUS
  Administered 2022-11-30: 650 [IU]/h via INTRAVENOUS
  Filled 2022-11-28 (×2): qty 250

## 2022-11-28 MED ORDER — HEPARIN BOLUS VIA INFUSION
2300.0000 [IU] | Freq: Once | INTRAVENOUS | Status: AC
  Administered 2022-11-28: 2300 [IU] via INTRAVENOUS
  Filled 2022-11-28: qty 2300

## 2022-11-28 NOTE — Progress Notes (Signed)
ANTICOAGULATION CONSULT NOTE  Pharmacy Consult for heparin infusion Indication: pulmonary embolus  Allergies  Allergen Reactions   Penicillins Rash and Other (See Comments)    Patient Measurements: Weight: 38.6 kg (85 lb 1.6 oz) (From O/V note on 10/14/22) Heparin Dosing Weight: 38.6 kg  Vital Signs: Temp: 97.9 F (36.6 C) (04/29 2152) Temp Source: Oral (04/29 2152) BP: 136/48 (04/29 2230) Pulse Rate: 79 (04/29 2230)  Labs: Recent Labs    11/28/22 1451  HGB 13.2  HCT 42.6  PLT 245  CREATININE 0.77    Estimated Creatinine Clearance: 35.9 mL/min (by C-G formula based on SCr of 0.77 mg/dL).   Medical History: Past Medical History:  Diagnosis Date   Arthritis    Cancer (HCC)    CHF (congestive heart failure) (HCC)    COPD (chronic obstructive pulmonary disease) (HCC)    Dementia (HCC)    Hyperlipidemia     Assessment: Pt is a 77 yo female presenting to ED d/t low SpO2 levels, found with "pulmonary embolism in the left main pulmonary artery extending into the left lower lobar branch."  Goal of Therapy:  Heparin level 0.3-0.7 units/ml Monitor platelets by anticoagulation protocol: Yes   Plan:  Bolus 2300 units x 1 Start heparin infusion at 600 units/hr Will check HL in 8 hr after start of infusion CBC daily while on heparin  Otelia Sergeant, PharmD, [facility] 11/28/2022 11:37 PM

## 2022-11-28 NOTE — ED Triage Notes (Signed)
First RN note-  Pt from [facility] in [place], [place] due to pt being sleepy and oxygen in the 80s. Pt is on oxygen chronically. Oxygen increased from 2lpm to 4lpm and sp02 went to 90s%. Patients baseline 92% on chronic o2.

## 2022-11-28 NOTE — ED Triage Notes (Signed)
Pt comes via EMs from [facility] with c/o low O2. Pt states no sob. Pt states the facility called bc her o2 was in the 80s and not the 90s.  Pt on 4L currently and O2 at 93%

## 2022-11-28 NOTE — ED Notes (Signed)
Pt to CT

## 2022-11-28 NOTE — ED Notes (Signed)
Pt family reports that her SpO2 has been in the 70-80's for maybe 3 weeks. Pt has recently moved into a new assisted living facility and family doesn't believe that the new facility is keeping her on the O2 like they should be (when ambulating to bathroom and going to the dining room). Pt normally wears 2L at home, pt is currently wearing 4L [place] and is currently at 98L. Family not sure if her concentrator is working properly.

## 2022-11-28 NOTE — ED Provider Notes (Signed)
[facility] Provider Note    Event Date/Time   First MD Initiated Contact with Patient 11/28/22 2150     (approximate)   History   Low O2   HPI  Nancy Berg is a 77 y.o. female recent treatment for breast cancer previously on chemotherapy not on anticoagulation but on chronic O2 for history of COPD presents to the ER for evaluation of shortness of breath has been reportedly hypoxic intermittently over the past several days as low as 70 to 80%.  Had another episode where she was hypoxic at rest today and was sent to the ER for further evaluation.     Physical Exam   Triage Vital Signs: ED Triage Vitals  Enc Vitals Group     BP 11/28/22 1450 113/66     Pulse Rate 11/28/22 1450 83     Resp 11/28/22 1450 19     Temp 11/28/22 1450 98 F (36.7 C)     Temp src --      SpO2 11/28/22 1450 93 %     Weight --      Height --      Head Circumference --      Peak Flow --      Pain Score 11/28/22 1449 0     Pain Loc --      Pain Edu? --      Excl. in GC? --     Most recent vital signs: Vitals:   11/28/22 2230 11/28/22 2330  BP: (!) 136/48 (!) 119/58  Pulse: 79 82  Resp:  18  Temp:    SpO2: 95% 97%     Constitutional: Alert, chronically ill appearing and frail Eyes: Conjunctivae are normal.  Head: Atraumatic. Nose: No congestion/rhinnorhea. Mouth/Throat: Mucous membranes are moist.   Neck: Painless ROM.  Cardiovascular:   Good peripheral circulation. Respiratory: Normal respiratory effort.  No retractions.  Gastrointestinal: Soft and nontender.  Musculoskeletal:  no deformity Neurologic:  MAE spontaneously. No gross focal neurologic deficits are appreciated.  Skin:  Skin is warm, dry and intact. No rash noted. Psychiatric: Mood and affect are normal. Speech and behavior are normal.    ED Results / Procedures / Treatments   Labs (all labs ordered are listed, but only abnormal results are displayed) Labs Reviewed  BASIC METABOLIC  PANEL - Abnormal; Notable for the following components:      Result Value   Glucose, Bld 111 (*)    All other components within normal limits  CBC - Abnormal; Notable for the following components:   WBC 11.8 (*)    All other components within normal limits  BLOOD GAS, VENOUS - Abnormal; Notable for the following components:   pH, Ven 7.45 (*)    pO2, Ven <31 (*)    Bicarbonate 38.2 (*)    Acid-Base Excess 11.9 (*)    All other components within normal limits  LACTIC ACID, PLASMA  APTT  PROTIME-INR     EKG  ED ECG REPORT I, Willy Eddy, the attending physician, personally viewed and interpreted this ECG.   Date: 11/28/2022  EKG Time: 14:46  Rate: 14:46  Rhythm: sinus  Axis: right  Intervals:normal  ST&T Change: no stemi, no depressions    RADIOLOGY Please see ED Course for my review and interpretation.  I personally reviewed all radiographic images ordered to evaluate for the above acute complaints and reviewed radiology reports and findings.  These findings were personally discussed with the patient.  Please see medical  record for radiology report.    PROCEDURES:  Critical Care performed: Yes, see critical care procedure note(s)  .Critical Care  Performed by: Willy Eddy, MD Authorized by: Willy Eddy, MD   Critical care provider statement:    Critical care time (minutes):  36   Critical care was necessary to treat or prevent imminent or life-threatening deterioration of the following conditions:  Circulatory failure and respiratory failure   Critical care was time spent personally by me on the following activities:  Ordering and performing treatments and interventions, ordering and review of laboratory studies, ordering and review of radiographic studies, pulse oximetry, re-evaluation of patient's condition, review of old charts, obtaining history from patient or surrogate, examination of patient, evaluation of patient's response to treatment,  discussions with primary provider, discussions with consultants and development of treatment plan with patient or surrogate    MEDICATIONS ORDERED IN ED: Medications  heparin ADULT infusion 100 units/mL (25000 units/273mL) (600 Units/hr Intravenous New Bag/Given 11/28/22 2350)  iohexol (OMNIPAQUE) 350 MG/ML injection 75 mL (75 mLs Intravenous Contrast Given 11/28/22 2259)  heparin bolus via infusion 2,300 Units (2,300 Units Intravenous Bolus from Bag 11/28/22 2350)     IMPRESSION / MDM / ASSESSMENT AND PLAN / ED COURSE  I reviewed the triage vital signs and the nursing notes.                              Differential diagnosis includes, but is not limited to, Asthma, copd, CHF, pna, ptx, malignancy, Pe, anemia  Patient presenting to the ER for evaluation of symptoms as described above.  Based on symptoms, risk factors and considered above differential, this presenting complaint could reflect a potentially life-threatening illness therefore the patient will be placed on continuous pulse oximetry and telemetry for monitoring.  Laboratory evaluation will be sent to evaluate for the above complaints.  Chest x-ray on my review and interpretation without evidence of consolidation or pneumothorax.  Based on her presentation with acute respiratory failure with hypoxia I am concerned for PE given her immobilization and recent malignancy will order CTA.   Clinical Course as of 11/28/22 2355  Mon Nov 28, 2022  2327 CTA my review and interpretation does show evidence of PE.  Per radiology does show evidence of right heart strain.  Will heparinize.  Will consult hospitalist for admission. [PR]    Clinical Course User Index [PR] Willy Eddy, MD     FINAL CLINICAL IMPRESSION(S) / ED DIAGNOSES   Final diagnoses:  Other acute pulmonary embolism, unspecified whether acute cor pulmonale present (HCC)  Acute on chronic respiratory failure with hypoxia (HCC)     Rx / DC Orders   ED Discharge  Orders     None        Note:  This document was prepared using Dragon voice recognition software and may include unintentional dictation errors.    Willy Eddy, MD 11/28/22 [phone]

## 2022-11-29 ENCOUNTER — Inpatient Hospital Stay: Payer: Medicare HMO

## 2022-11-29 ENCOUNTER — Encounter
Admission: EM | Disposition: A | Discharge status: Skilled Nursing Facility | Payer: Self-pay | Source: Home / Self Care | Attending: Internal Medicine

## 2022-11-29 ENCOUNTER — Encounter: Payer: Self-pay | Admitting: Internal Medicine

## 2022-11-29 DIAGNOSIS — I50811 Acute right heart failure: Secondary | ICD-10-CM | POA: Diagnosis not present

## 2022-11-29 DIAGNOSIS — E43 Unspecified severe protein-calorie malnutrition: Secondary | ICD-10-CM | POA: Insufficient documentation

## 2022-11-29 DIAGNOSIS — R64 Cachexia: Secondary | ICD-10-CM | POA: Diagnosis present

## 2022-11-29 DIAGNOSIS — Z7901 Long term (current) use of anticoagulants: Secondary | ICD-10-CM | POA: Diagnosis not present

## 2022-11-29 DIAGNOSIS — Z853 Personal history of malignant neoplasm of breast: Secondary | ICD-10-CM

## 2022-11-29 DIAGNOSIS — Z79899 Other long term (current) drug therapy: Secondary | ICD-10-CM | POA: Diagnosis not present

## 2022-11-29 DIAGNOSIS — J449 Chronic obstructive pulmonary disease, unspecified: Secondary | ICD-10-CM | POA: Diagnosis present

## 2022-11-29 DIAGNOSIS — E785 Hyperlipidemia, unspecified: Secondary | ICD-10-CM | POA: Diagnosis present

## 2022-11-29 DIAGNOSIS — I5021 Acute systolic (congestive) heart failure: Secondary | ICD-10-CM | POA: Diagnosis not present

## 2022-11-29 DIAGNOSIS — M1990 Unspecified osteoarthritis, unspecified site: Secondary | ICD-10-CM | POA: Diagnosis present

## 2022-11-29 DIAGNOSIS — I5032 Chronic diastolic (congestive) heart failure: Secondary | ICD-10-CM | POA: Diagnosis present

## 2022-11-29 DIAGNOSIS — I2699 Other pulmonary embolism without acute cor pulmonale: Secondary | ICD-10-CM

## 2022-11-29 DIAGNOSIS — Z87891 Personal history of nicotine dependence: Secondary | ICD-10-CM | POA: Diagnosis not present

## 2022-11-29 DIAGNOSIS — E46 Unspecified protein-calorie malnutrition: Secondary | ICD-10-CM | POA: Diagnosis present

## 2022-11-29 DIAGNOSIS — Z9221 Personal history of antineoplastic chemotherapy: Secondary | ICD-10-CM

## 2022-11-29 DIAGNOSIS — J90 Pleural effusion, not elsewhere classified: Secondary | ICD-10-CM | POA: Diagnosis present

## 2022-11-29 DIAGNOSIS — D6859 Other primary thrombophilia: Secondary | ICD-10-CM | POA: Diagnosis present

## 2022-11-29 DIAGNOSIS — I272 Pulmonary hypertension, unspecified: Secondary | ICD-10-CM | POA: Diagnosis present

## 2022-11-29 DIAGNOSIS — Z681 Body mass index (BMI) 19 or less, adult: Secondary | ICD-10-CM | POA: Diagnosis not present

## 2022-11-29 DIAGNOSIS — J439 Emphysema, unspecified: Secondary | ICD-10-CM | POA: Diagnosis present

## 2022-11-29 DIAGNOSIS — I110 Hypertensive heart disease with heart failure: Secondary | ICD-10-CM | POA: Diagnosis present

## 2022-11-29 DIAGNOSIS — F03B0 Unspecified dementia, moderate, without behavioral disturbance, psychotic disturbance, mood disturbance, and anxiety: Secondary | ICD-10-CM | POA: Diagnosis present

## 2022-11-29 DIAGNOSIS — C50919 Malignant neoplasm of unspecified site of unspecified female breast: Secondary | ICD-10-CM | POA: Diagnosis present

## 2022-11-29 DIAGNOSIS — J9621 Acute and chronic respiratory failure with hypoxia: Secondary | ICD-10-CM | POA: Diagnosis present

## 2022-11-29 DIAGNOSIS — Z9889 Other specified postprocedural states: Secondary | ICD-10-CM | POA: Diagnosis not present

## 2022-11-29 DIAGNOSIS — I2609 Other pulmonary embolism with acute cor pulmonale: Principal | ICD-10-CM

## 2022-11-29 DIAGNOSIS — J9811 Atelectasis: Secondary | ICD-10-CM | POA: Diagnosis present

## 2022-11-29 DIAGNOSIS — Z9981 Dependence on supplemental oxygen: Secondary | ICD-10-CM

## 2022-11-29 DIAGNOSIS — R310 Gross hematuria: Secondary | ICD-10-CM | POA: Diagnosis not present

## 2022-11-29 DIAGNOSIS — I959 Hypotension, unspecified: Secondary | ICD-10-CM | POA: Diagnosis not present

## 2022-11-29 DIAGNOSIS — E1151 Type 2 diabetes mellitus with diabetic peripheral angiopathy without gangrene: Secondary | ICD-10-CM | POA: Diagnosis present

## 2022-11-29 HISTORY — PX: PULMONARY THROMBECTOMY: CATH118295

## 2022-11-29 LAB — PROTIME-INR
INR: 1 (ref 0.8–1.2)
Prothrombin Time: 12.7 seconds (ref 11.4–15.2)

## 2022-11-29 LAB — CBC
HCT: 39.6 % (ref 36.0–46.0)
Hemoglobin: 12.4 g/dL (ref 12.0–15.0)
MCH: 31 pg (ref 26.0–34.0)
MCHC: 31.3 g/dL (ref 30.0–36.0)
MCV: 99 fL (ref 80.0–100.0)
Platelets: 213 10*3/uL (ref 150–400)
RBC: 4 MIL/uL (ref 3.87–5.11)
RDW: 15.3 % (ref 11.5–15.5)
WBC: 10.6 10*3/uL — ABNORMAL HIGH (ref 4.0–10.5)
nRBC: 0 % (ref 0.0–0.2)

## 2022-11-29 LAB — PREALBUMIN: Prealbumin: 19 mg/dL (ref 18–38)

## 2022-11-29 LAB — HEPARIN LEVEL (UNFRACTIONATED): Heparin Unfractionated: 0.29 IU/mL — ABNORMAL LOW (ref 0.30–0.70)

## 2022-11-29 LAB — HEMOGLOBIN A1C
Hgb A1c MFr Bld: 5.1 % (ref 4.8–5.6)
Mean Plasma Glucose: 99.67 mg/dL

## 2022-11-29 LAB — PROTEIN, TOTAL: Total Protein: 6.4 g/dL — ABNORMAL LOW (ref 6.5–8.1)

## 2022-11-29 LAB — APTT: aPTT: 28 seconds (ref 24–36)

## 2022-11-29 SURGERY — PULMONARY THROMBECTOMY
Anesthesia: Moderate Sedation

## 2022-11-29 MED ORDER — HYDROMORPHONE HCL 1 MG/ML IJ SOLN: 1.0000 mg | Freq: Once | INTRAMUSCULAR | Status: DC | PRN

## 2022-11-29 MED ORDER — MIDAZOLAM HCL 2 MG/ML PO SYRP: 8.0000 mg | ORAL_SOLUTION | Freq: Once | ORAL | Status: DC | PRN

## 2022-11-29 MED ORDER — FENTANYL CITRATE (PF) 100 MCG/2ML IJ SOLN
INTRAMUSCULAR | Status: AC
  Filled 2022-11-29: qty 2

## 2022-11-29 MED ORDER — HEPARIN SODIUM (PORCINE) 1000 UNIT/ML IJ SOLN
INTRAMUSCULAR | Status: DC | PRN
  Administered 2022-11-29: 3000 [IU] via INTRAVENOUS

## 2022-11-29 MED ORDER — VITAMIN D 25 MCG (1000 UNIT) PO TABS
2000.0000 [IU] | ORAL_TABLET | Freq: Every day | ORAL | Status: DC
  Administered 2022-11-30 – 2022-12-05 (×6): 2000 [IU] via ORAL
  Filled 2022-11-29 (×6): qty 2

## 2022-11-29 MED ORDER — ONDANSETRON HCL 4 MG/2ML IJ SOLN: 4.0000 mg | Freq: Four times a day (QID) | INTRAMUSCULAR | Status: DC | PRN

## 2022-11-29 MED ORDER — ALBUTEROL SULFATE HFA 108 (90 BASE) MCG/ACT IN AERS: 2.0000 | INHALATION_SPRAY | Freq: Four times a day (QID) | RESPIRATORY_TRACT | Status: DC | PRN

## 2022-11-29 MED ORDER — MIDAZOLAM HCL 2 MG/2ML IJ SOLN
INTRAMUSCULAR | Status: AC
  Filled 2022-11-29: qty 2

## 2022-11-29 MED ORDER — POTASSIUM CHLORIDE CRYS ER 20 MEQ PO TBCR
20.0000 meq | EXTENDED_RELEASE_TABLET | Freq: Every day | ORAL | Status: DC
  Administered 2022-11-30 – 2022-12-05 (×6): 20 meq via ORAL
  Filled 2022-11-29 (×6): qty 1

## 2022-11-29 MED ORDER — ACETAMINOPHEN 325 MG PO TABS
650.0000 mg | ORAL_TABLET | Freq: Four times a day (QID) | ORAL | Status: DC | PRN
Start: 2022-11-29 — End: 2022-11-29

## 2022-11-29 MED ORDER — MAGNESIUM OXIDE -MG SUPPLEMENT 400 (240 MG) MG PO TABS
200.0000 mg | ORAL_TABLET | Freq: Every day | ORAL | Status: DC
  Administered 2022-11-30 – 2022-12-05 (×6): 200 mg via ORAL
  Filled 2022-11-29 (×6): qty 1

## 2022-11-29 MED ORDER — VANCOMYCIN HCL IN DEXTROSE 1-5 GM/200ML-% IV SOLN
1000.0000 mg | INTRAVENOUS | Status: DC
  Administered 2022-11-29: 1000 mg via INTRAVENOUS
  Filled 2022-11-29: qty 200

## 2022-11-29 MED ORDER — MIDAZOLAM HCL 2 MG/2ML IJ SOLN
INTRAMUSCULAR | Status: DC | PRN
  Administered 2022-11-29: .5 mg via INTRAVENOUS

## 2022-11-29 MED ORDER — CALCIUM CARBONATE ANTACID 500 MG PO CHEW
1000.0000 mg | CHEWABLE_TABLET | Freq: Two times a day (BID) | ORAL | Status: DC
  Administered 2022-11-30 – 2022-12-03 (×7): 1000 mg via ORAL
  Filled 2022-11-29 (×7): qty 5

## 2022-11-29 MED ORDER — FENTANYL CITRATE (PF) 100 MCG/2ML IJ SOLN
INTRAMUSCULAR | Status: DC | PRN
  Administered 2022-11-29: 25 ug via INTRAVENOUS

## 2022-11-29 MED ORDER — BUDESONIDE 0.5 MG/2ML IN SUSP
0.5000 mg | Freq: Every day | RESPIRATORY_TRACT | Status: DC
  Administered 2022-11-29 – 2022-12-05 (×6): 0.5 mg via RESPIRATORY_TRACT
  Filled 2022-11-29 (×7): qty 2

## 2022-11-29 MED ORDER — ASPIRIN 81 MG PO TBEC
81.0000 mg | DELAYED_RELEASE_TABLET | Freq: Every day | ORAL | Status: DC
  Administered 2022-11-30 – 2022-12-03 (×4): 81 mg via ORAL
  Filled 2022-11-29 (×4): qty 1

## 2022-11-29 MED ORDER — FENTANYL CITRATE PF 50 MCG/ML IJ SOSY: 12.5000 ug | PREFILLED_SYRINGE | Freq: Once | INTRAMUSCULAR | Status: DC | PRN

## 2022-11-29 MED ORDER — ONDANSETRON HCL 4 MG PO TABS: 4.0000 mg | ORAL_TABLET | Freq: Four times a day (QID) | ORAL | Status: DC | PRN

## 2022-11-29 MED ORDER — SODIUM CHLORIDE 0.9 % IV SOLN: INTRAVENOUS | Status: DC

## 2022-11-29 MED ORDER — METHYLPREDNISOLONE SODIUM SUCC 125 MG IJ SOLR: 125.0000 mg | Freq: Once | INTRAMUSCULAR | Status: DC | PRN

## 2022-11-29 MED ORDER — SENNA 8.6 MG PO TABS
1.0000 | ORAL_TABLET | Freq: Two times a day (BID) | ORAL | Status: DC
  Administered 2022-11-29 – 2022-12-01 (×3): 8.6 mg via ORAL
  Filled 2022-11-29 (×4): qty 1

## 2022-11-29 MED ORDER — IODIXANOL 320 MG/ML IV SOLN
INTRAVENOUS | Status: DC | PRN
  Administered 2022-11-29: 45 mL

## 2022-11-29 MED ORDER — ACETAMINOPHEN 325 MG PO TABS: 650.0000 mg | ORAL_TABLET | ORAL | Status: DC

## 2022-11-29 MED ORDER — HEPARIN BOLUS VIA INFUSION
550.0000 [IU] | Freq: Once | INTRAVENOUS | Status: AC
  Administered 2022-11-29: 550 [IU] via INTRAVENOUS
  Filled 2022-11-29: qty 550

## 2022-11-29 MED ORDER — ACETAMINOPHEN 325 MG PO TABS: 650.0000 mg | ORAL_TABLET | Freq: Two times a day (BID) | ORAL | Status: DC | PRN

## 2022-11-29 MED ORDER — UMECLIDINIUM BROMIDE 62.5 MCG/ACT IN AEPB
1.0000 | INHALATION_SPRAY | Freq: Every day | RESPIRATORY_TRACT | Status: DC
  Administered 2022-11-29 – 2022-12-05 (×7): 1 via RESPIRATORY_TRACT
  Filled 2022-11-29 (×2): qty 7

## 2022-11-29 MED ORDER — MIRTAZAPINE 15 MG PO TABS
15.0000 mg | ORAL_TABLET | Freq: Every day | ORAL | Status: DC
  Administered 2022-11-29 – 2022-12-04 (×6): 15 mg via ORAL
  Filled 2022-11-29 (×6): qty 1

## 2022-11-29 MED ORDER — SODIUM CHLORIDE 0.9% FLUSH
3.0000 mL | Freq: Two times a day (BID) | INTRAVENOUS | Status: DC
  Administered 2022-11-30 – 2022-12-05 (×11): 3 mL via INTRAVENOUS

## 2022-11-29 MED ORDER — SODIUM CHLORIDE 0.9 % IV SOLN: 250.0000 mL | INTRAVENOUS | Status: DC | PRN

## 2022-11-29 MED ORDER — ENSURE ENLIVE PO LIQD
237.0000 mL | Freq: Three times a day (TID) | ORAL | Status: DC
  Administered 2022-11-29 – 2022-12-02 (×7): 237 mL via ORAL

## 2022-11-29 MED ORDER — VANCOMYCIN HCL IN DEXTROSE 1-5 GM/200ML-% IV SOLN
INTRAVENOUS | Status: AC
  Filled 2022-11-29: qty 200

## 2022-11-29 MED ORDER — DIPHENHYDRAMINE HCL 50 MG/ML IJ SOLN: 50.0000 mg | Freq: Once | INTRAMUSCULAR | Status: DC | PRN

## 2022-11-29 MED ORDER — ACETAMINOPHEN 325 MG RE SUPP
650.0000 mg | Freq: Four times a day (QID) | RECTAL | Status: DC | PRN
Start: 2022-11-29 — End: 2022-11-29

## 2022-11-29 MED ORDER — AMMONIUM LACTATE 12 % EX LOTN
1.0000 | TOPICAL_LOTION | Freq: Every day | CUTANEOUS | Status: DC
  Administered 2022-11-29 – 2022-12-05 (×6): 1 via TOPICAL
  Filled 2022-11-29 (×2): qty 400

## 2022-11-29 MED ORDER — SODIUM CHLORIDE 0.9% FLUSH: 3.0000 mL | INTRAVENOUS | Status: DC | PRN

## 2022-11-29 MED ORDER — ATORVASTATIN CALCIUM 20 MG PO TABS
20.0000 mg | ORAL_TABLET | Freq: Every day | ORAL | Status: DC
  Administered 2022-11-30 – 2022-12-05 (×6): 20 mg via ORAL
  Filled 2022-11-29 (×6): qty 1

## 2022-11-29 MED ORDER — FAMOTIDINE 20 MG PO TABS: 40.0000 mg | ORAL_TABLET | Freq: Once | ORAL | Status: DC | PRN

## 2022-11-29 MED ORDER — ACETAMINOPHEN 325 MG PO TABS
650.0000 mg | ORAL_TABLET | Freq: Three times a day (TID) | ORAL | Status: DC
  Administered 2022-11-29 – 2022-12-05 (×17): 650 mg via ORAL
  Filled 2022-11-29 (×17): qty 2

## 2022-11-29 MED ORDER — TRAZODONE HCL 50 MG PO TABS: 25.0000 mg | ORAL_TABLET | Freq: Every evening | ORAL | Status: DC | PRN

## 2022-11-29 MED ORDER — LATANOPROST 0.005 % OP SOLN
1.0000 [drp] | Freq: Every day | OPHTHALMIC | Status: DC
  Administered 2022-11-29 – 2022-12-04 (×6): 1 [drp] via OPHTHALMIC
  Filled 2022-11-29 (×3): qty 2.5

## 2022-11-29 MED ORDER — ALBUTEROL SULFATE (2.5 MG/3ML) 0.083% IN NEBU: 2.5000 mg | INHALATION_SOLUTION | Freq: Four times a day (QID) | RESPIRATORY_TRACT | Status: DC | PRN

## 2022-11-29 SURGICAL SUPPLY — 17 items
CANISTER PENUMBRA ENGINE (MISCELLANEOUS) IMPLANT
CATH INDIGO 12XTORQ 100 (CATHETERS) IMPLANT
CATH INDIGO SEP 12 (CATHETERS) IMPLANT
CATH INFINITI 5FR ANG PIGTAIL (CATHETERS) IMPLANT
CATH INFINITI JR4 5F (CATHETERS) IMPLANT
CATH SELECT BERN TIP 5F 130 (CATHETERS) IMPLANT
CLOSURE PERCLOSE PROSTYLE (VASCULAR PRODUCTS) IMPLANT
COVER PROBE ULTRASOUND 5X96 (MISCELLANEOUS) IMPLANT
GLIDEWIRE ADV .035X180CM (WIRE) IMPLANT
PACK ANGIOGRAPHY (CUSTOM PROCEDURE TRAY) ×1 IMPLANT
SHEATH BRITE TIP 6FRX11 (SHEATH) IMPLANT
SHEATH FAST CATH 12F 12CM (SHEATH) IMPLANT
SUT MNCRL AB 4-0 PS2 18 (SUTURE) IMPLANT
SYR MEDRAD MARK 7 150ML (SYRINGE) IMPLANT
TUBING CONTRAST HIGH PRESS 72 (TUBING) IMPLANT
WIRE GUIDERIGHT .035X150 (WIRE) IMPLANT
WIRE SUPRACORE 300CM (WIRE) IMPLANT

## 2022-11-29 NOTE — Assessment & Plan Note (Signed)
Patient followed by Dr. Mayo Ao, pulmonologist. She has severe emphysema. Last OV 10/14/22 - GOLD stage  III-IV stble on her current regimen and O2 at 2L:Marland Kitchen  Plan Continue home regimen of inhalational meds  Oxygen at 2-4 l [place] to keep sats > 88%  Consult from pulmonary service due to large left main PE with strain

## 2022-11-29 NOTE — Assessment & Plan Note (Signed)
Patient very thin - appears emaciated.  Plan Total protein and prealbumin  RD consult

## 2022-11-29 NOTE — Assessment & Plan Note (Signed)
Patient has seen Dr. Juliann Pares for pulmonary hypertension and abnormal Echo done 06/03/22 which revealed EF 60-65% and indeterminant diastolic function. Rrecommendation was for continued medical management.   Plan Continue home regimen

## 2022-11-29 NOTE — Progress Notes (Signed)
[facility] Progress Note  Patient: Nancy Berg    ZOX:[id number]  DOA: 11/28/2022     Date of Service: the patient was seen and examined on 11/29/2022  Chief Complaint  Patient presents with   Low O2   Brief hospital course: Ms. 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 She is continue on immunologic therapy for her breast cancer. At the long-term care facility where she resides she was noted to have increased hypoxemia with O2 sats in the 70-80's. Due to persistent hypoxemia she was transported to [facility]ED for evaluation.     ED Course: Afebrile, 119/58  82  RR 18. Chroncally ill, emaciated woman presenting SOB with hypoxemia  on 2L [place] oxygen. EDP exam unremarkable. Lab: VBG 7.45/55/<31, Glucose 111, lactic acid 1.2  WBC 11.8, Hgb 13.2, Plt 245. CXR increased basilar markings c/w atelectasis vs PNA. CTA - PE Left main pulmnary artery extending to left lower lobe with evidence of right heart strain; severe emphysematous changes. In ED patient started on IV heparin. TRH called to admit for continued management.   Assessment and Plan:  #Acute pulmonary embolism with right heart strain CTA reviewed as above Continue heparin IV infusion Continue supplemental O2 inhalation and gradually wean off Follow 2D echocardiogram Follow-up venous duplex lower extremity to rule out DVT Vascular surgery consulted, plan for thrombectomy in the afternoon   [place], HLD, Chronic diastolic CHF, no exacerbation noticed Continue to monitor  PVD, continue aspirin and statin  COPD, no exacerbation noticed Patient follows Dr. Meredeth Ide pulmonologist for severe emphysema, last OV 10/14/2022-goal discharge 3-4 stable on current regimen and 2 L oxygen Continue Pulmicort inhaler daily, Incruse Ellipta inhaler daily and albuterol nebulizer as needed  Breast cancer   Patient s/p mastectomy, adjuvant XRT and a course of carbo-taxol chemotherapy with addition of Keytruda. F/u out pt as Per oncology   Severe protein calorie malnutrition  Patient very thin - appears emaciated. Plan     Total protein and prealbumin             RD consult   Body mass index is 16.2 kg/m.  Interventions:     Diet: NPO for now DVT Prophylaxis: Therapeutic Anticoagulation with current IV infusion    Advance goals of care discussion: Full code  Family Communication: family was not present at bedside, at the time of interview.  The pt provided permission to discuss medical plan with the family. Opportunity was given to ask question and all questions were answered satisfactorily.   Disposition:  Pt is from SNF, admitted with pulmonary embolism with right heart strain, vascular surgery consulted for thrombectomy, still on heparin IV infusion, which precludes a safe discharge. Discharge to SNF, when stable and cleared from vascular surgery.  Subjective: No significant events overnight, patient still has mild shortness of breath, denies any chest pain or palpitations, no any other active issues.  Seems to be resting comfortably on the bed. Patient has significant dementia, AAO x 1   Physical Exam: General: NAD, lying comfortably Appear in no distress, affect appropriate Eyes: PERRLA ENT: Oral Mucosa Clear, moist  Neck: no JVD,  Cardiovascular: S1 and S2 Present, no Murmur,  Respiratory: good respiratory effort, Bilateral Air entry equal and Decreased, no Crackles, no wheezes Abdomen: Bowel Sound present, Soft and no tenderness,  Skin: no rashes Extremities: no Pedal edema, no calf tenderness Neurologic: without any new focal  findings Gait not checked due to patient safety concerns  Vitals:   11/29/22 0410 11/29/22 0744 11/29/22 0750 11/29/22 1125  BP: 119/66 (!) 107/55  (!) 101/54  Pulse: 77 66 64 76  Resp: 20 16  16   Temp: 98.1 F (36.7 C) 98.7 F (37.1 C)   97.8 F (36.6 C)  TempSrc: Oral     SpO2: 100% (!) 84% (!) 89% 94%  Weight: 38.9 kg     Height: 5\' 1"  (1.549 m)       Intake/Output Summary (Last 24 hours) at 11/29/2022 1350 Last data filed at 11/29/2022 0737 Gross per 24 hour  Intake --  Output 450 ml  Net -450 ml   Filed Weights   11/28/22 2300 11/29/22 0410  Weight: 38.6 kg 38.9 kg    Data Reviewed: I have personally reviewed and interpreted daily labs, tele strips, imagings as discussed above. I reviewed all nursing notes, pharmacy notes, vitals, pertinent old records I have discussed plan of care as described above with RN and patient/family.  CBC: Recent Labs  Lab 11/28/22 1451 11/29/22 0829  WBC 11.8* 10.6*  HGB 13.2 12.4  HCT 42.6 39.6  MCV 100.0 99.0  PLT 245 213   Basic Metabolic Panel: Recent Labs  Lab 11/28/22 1451  NA 140  K 3.9  CL 100  CO2 31  GLUCOSE 111*  BUN 20  CREATININE 0.77  CALCIUM 9.6    Studies: CT Angio Chest PE W and/or Wo Contrast  Addendum Date: 11/28/2022   ADDENDUM REPORT: 11/28/2022 23:49 ADDENDUM: These results were called by telephone at the time of interpretation on 11/28/2022 at 11:25 p.m. to provider Willy Eddy , who verbally acknowledged these results. Electronically Signed   By: Darliss Cheney M.D.   On: 11/28/2022 23:49   Result Date: 11/28/2022 CLINICAL DATA:  High probability for PE.  Shortness of breath. EXAM: CT ANGIOGRAPHY CHEST WITH CONTRAST TECHNIQUE: Multidetector CT imaging of the chest was performed using the standard protocol during bolus administration of intravenous contrast. Multiplanar CT image reconstructions and MIPs were obtained to evaluate the vascular anatomy. RADIATION DOSE REDUCTION: This exam was performed according to the departmental dose-optimization program which includes automated exposure control, adjustment of the mA and/or kV according to patient size and/or use of iterative reconstruction technique. CONTRAST:  75mL OMNIPAQUE IOHEXOL 350  MG/ML SOLN COMPARISON:  Chest CT 06/30/2022.  Chest x-ray same day. FINDINGS: Cardiovascular: Satisfactory opacification of the pulmonary arteries to the segmental level. There is pulmonary embolism in the left main pulmonary artery extending into left lower lobar branch. Normal heart size. No pericardial effusion. There are atherosclerotic calcifications of the aorta and coronary arteries. Mediastinum/Nodes: No enlarged mediastinal, hilar, or axillary lymph nodes. Thyroid gland, trachea, and esophagus demonstrate no significant findings. Lungs/Pleura: Severe emphysematous changes are again seen there are small bilateral pleural effusions. There is chronic appearing atelectasis in the left lower lobe. This is unchanged from prior. Upper Abdomen: No acute abnormality. Musculoskeletal: No chest wall abnormality. No acute or significant osseous findings. Review of the MIP images confirms the above findings. IMPRESSION: 1. Pulmonary embolism in the left main pulmonary artery extending into the left lower lobar branch. Positive for acute PE with CTevidence of right heart strain (RV/LV Ratio = 1.36) consistent with at least submassive (intermediate risk) PE. The presence of right heart strain has been associated with an increased risk of morbidity and mortality. 2. Small bilateral pleural effusions. 3. Stable chronic appearing atelectasis in the left lower lobe. Aortic  Atherosclerosis (ICD10-I70.0). Electronically Signed: By: Darliss Cheney M.D. On: 11/28/2022 23:24   DG Chest 2 View  Result Date: 11/28/2022 CLINICAL DATA:  Shortness of breath, hypoxia EXAM: CHEST - 2 VIEW COMPARISON:  11/25/2021 FINDINGS: Cardiac size is within normal limits. Left hemidiaphragm is elevated which may be due to eventration or paralysis. Linear densities in the left lower lung field may be related to elevation of left hemidiaphragm. There is blunting of both lateral CP angles. Small linear densities are seen in right lower lung field.  There is poor inspiration. There is prominence of both hilar regions. There is no pneumothorax. IMPRESSION: There are no signs of pulmonary edema or focal pulmonary consolidation. Increased markings are seen in both lower lung fields which may be due to poor inspiration or suggest atelectasis/pneumonia. There is blunting of both lateral CP angles suggesting small bilateral pleural effusions. Both hilar regions are prominent. This may be due to pulmonary arterial hypertension or suggest lymphadenopathy. Short-term follow-up chest radiographs along with CT if warranted may be considered. Electronically Signed   By: Ernie Avena M.D.   On: 11/28/2022 15:26    Scheduled Meds:  acetaminophen  650 mg Oral TID   ammonium lactate  1 Application Topical Daily   aspirin EC  81 mg Oral Daily   atorvastatin  20 mg Oral Daily   budesonide  0.5 mg Inhalation Daily   calcium carbonate  1,000 mg Oral BID WC   cholecalciferol  2,000 Units Oral Daily   latanoprost  1 drop Both Eyes QHS   magnesium oxide  200 mg Oral Daily   mirtazapine  15 mg Oral QHS   potassium chloride SA  20 mEq Oral Daily   senna  1 tablet Oral BID   sodium chloride flush  3 mL Intravenous Q12H   umeclidinium bromide  1 puff Inhalation Daily   Continuous Infusions:  sodium chloride     heparin 650 Units/hr (11/29/22 1007)   PRN Meds: sodium chloride, acetaminophen **AND** acetaminophen, albuterol, ondansetron **OR** ondansetron (ZOFRAN) IV, sodium chloride flush, traZODone  Time spent: 35 minutes  Author: Gillis Santa. MD [facility] 11/29/2022 1:50 PM  To reach On-call, see care teams to locate the attending and reach out to them via [email or web address]. If 7PM-7AM, please contact night-coverage If you still have difficulty reaching the attending provider, please page the [facility] (Director on Call) for [facility] on amion for assistance.

## 2022-11-29 NOTE — Op Note (Signed)
[facility]  Percutaneous Study/Intervention Procedural Note   Date of Surgery: 11/29/2022,4:58 PM  Surgeon: Festus Barren  Pre-operative Diagnosis: Symptomatic pulmonary emboli  Post-operative diagnosis:  Same  Procedure(s) Performed:  1.  Contrast injection right heart  2.  Mechanical thrombectomy left main and lower lobe pulmonary arteries using the penumbra CAT 12 catheter  3.  Selective catheter placement left upper lobe and lower lobe pulmonary arteries  4.  Selective catheter placement right main pulmonary artery      Anesthesia: Conscious sedation was administered under my direct supervision by the interventional radiology RN. IV Versed plus fentanyl were utilized. Continuous ECG, pulse oximetry and blood pressure was monitored throughout the entire procedure.  Versed and fentanyl were administered intravenously.  Conscious sedation was administered for a total of 26 minutes using 0.5 mg of Versed and 25 mcg of Fentanyl.  EBL: 200 cc  Sheath: 12 French right femoral vein  Contrast: 45 cc   Fluoroscopy Time: 5.2 minutes  Indications:  Patient presents with pulmonary emboli. The patient is symptomatic with hypoxemia and dyspnea on exertion.  There is evidence of right heart strain on the CT angiogram. The patient is otherwise a good candidate for intervention and even the long-term benefits pulmonary angiography with thrombolysis is offered. The risks and benefits are reviewed long-term benefits are discussed. All questions are answered patient agrees to proceed.  Procedure:  Zharia Conrow a 77 y.o. female who was identified and appropriate procedural time out was performed.  The patient was then placed supine on the table and prepped and draped in the usual sterile fashion.  Ultrasound was used to evaluate the right common femoral vein.  It was patent, as it was echolucent and compressible.  A digital ultrasound image was acquired for the permanent  record.  A Seldinger needle was used to access the right common femoral vein under direct ultrasound guidance.  A 0.035 J wire was advanced without resistance and a 5Fr sheath was placed.  A ProGlide device was placed in a preclose fashion and then upsized to an 12 [place] sheath.    The wire and pigtail catheter were then negotiated into the right atrium and bolus injection of contrast was utilized to demonstrate the right ventricle and the pulmonary artery outflow. The advantage wire and JR4 catheter were then negotiated into the right main pulmonary artery where hand injection of contrast was utilized.  No significant pulmonary embolus was seen on the right side.  I then transition to the left side with a JR4 catheter and selective images were performed for the left upper lobe and left lower lobe after selective cannulation with a JR4 catheter.  There was a significant thrombus burden with the left main pulmonary artery being nearly occluded with thrombus extending to the left lower lobe pulmonary arteries.  3000 Units of heparin was then given and allowed to circulate.   The Penumbra Cat 12 catheter was then advanced up into the pulmonary vasculature. The left lung was addressed. Catheter was negotiated into the left main and left lower lobe pulmonary arteries and mechanical thrombectomy was performed. Passes were made with both the Penumbra catheter itself as well as introducing the separator. Follow-up imaging was then performed.  This demonstrated brisk flow through the left main pulmonary artery and marked improvement in the left lower lobe pulmonary arteries after thrombectomy.   After review these images wires were reintroduced and the catheter is removed. Then, the sheath is then pulled, the proglide is  secured and pressure is held. A monocryl was placed at the skin exit site. A sterile dressing is placed.    Findings:   Right heart imaging:  Right atrium and right ventricle and the  pulmonary outflow tract appears some what dilated  Right lung: No thrombus was seen in the right main, upper lobe, middle lobe, or lower lobe pulmonary arteries.  Left lung: Significant thrombus burden was seen in the left main pulmonary artery extending into the left lower lobe pulmonary artery    Disposition: Patient was taken to the recovery room in stable condition having tolerated the procedure well.  Festus Barren 11/29/2022,4:58 PM

## 2022-11-29 NOTE — Subjective & Objective (Signed)
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 She is continue on immunologic therapy for her breast cancer. At the long-term care facility where she resides she was noted to have increased hypoxemia with O2 sats in the 70-80's. Due to persistent hypoxemia she was transported to [facility]ED for evaluation.

## 2022-11-29 NOTE — Progress Notes (Addendum)
ANTICOAGULATION CONSULT NOTE  Pharmacy Consult for heparin infusion Indication: pulmonary embolus  Allergies  Allergen Reactions   Penicillins Rash and Other (See Comments)    Patient Measurements: Height: 5\' 1"  (154.9 cm) Weight: 38.9 kg (85 lb 12.1 oz) IBW/kg (Calculated) : 47.8 Heparin Dosing Weight: 38.6 kg  Vital Signs: Temp: 98.1 F (36.7 C) (04/30 0410) Temp Source: Oral (04/30 0410) BP: 119/66 (04/30 0410) Pulse Rate: 77 (04/30 0410)  Labs: Recent Labs    11/28/22 1451 11/28/22 2344  HGB 13.2  --   HCT 42.6  --   PLT 245  --   APTT  --  28  LABPROT  --  12.7  INR  --  1.0  CREATININE 0.77  --      Estimated Creatinine Clearance: 36.2 mL/min (by C-G formula based on SCr of 0.77 mg/dL).   Medical History: Past Medical History:  Diagnosis Date   Arthritis    Cancer (HCC)    CHF (congestive heart failure) (HCC)    COPD (chronic obstructive pulmonary disease) (HCC)    Dementia (HCC)    Hyperlipidemia     Assessment: Pt is a 77 yo female presenting to ED d/t low SpO2 levels, found with "pulmonary embolism in the left main pulmonary artery extending into the left lower lobar branch."  Goal of Therapy:  Heparin level 0.3-0.7 units/ml Monitor platelets by anticoagulation protocol: Yes   Plan: heparin level 0.29 - borderline subtherapeutic. Noted low body weight. Will slightly increase heparin. Bolus 550 units x 1 Increase heparin infusion to 650 units/hr Will check HL in 8 hr after rate change CBC daily while on heparin  Elliot Gurney, PharmD, BCPS Clinical Pharmacist  11/29/2022 9:39 AM

## 2022-11-29 NOTE — Progress Notes (Addendum)
Initial Nutrition Assessment  DOCUMENTATION CODES:   Severe malnutrition in context of chronic illness, Underweight  INTERVENTION:  - NPO advance diet as tolerated.   - Add Ensure Enlive po q day, each supplement provides 350 kcal and 20 grams of protein. (Once diet advances - prefers vanilla).   NUTRITION DIAGNOSIS:   Severe Malnutrition related to chronic illness as evidenced by severe fat depletion, severe muscle depletion.  GOAL:   Patient will meet greater than or equal to 90% of their needs  MONITOR:   Diet advancement  REASON FOR ASSESSMENT:   Consult Assessment of nutrition requirement/status  ASSESSMENT:   77 y.o. female admits related to persistent hypoxemia. PMH includes: arthritis, cancer, CHF, COPD, dementia, HLD. Pt is currently receiving medical management related to pulmonary embolism.  Meds reviewed:  lipitor, TUMS, Vit D3, Mag-ox, Klor-con, senokot. Labs reviewed: WDL.   The pt was present with her son at bedside. She reports that she was eating fair PTA. Pt is currently NPO and ready for diet to be advanced. No significant wt loss per record. Pt states that she does not like sweet foods and has tried the protein shakes in the past but thinks they are too sweet. RD encouraged the pt to try at least one Ensure shake per day to see if maybe she could tolerate them. Pt agreed and states that she prefers vanilla. RD will add supplements once pt's diet is able to be advanced. Will continue to monitor PO intakes.   NUTRITION - FOCUSED PHYSICAL EXAM:  Flowsheet Row Most Recent Value  Orbital Region Severe depletion  Upper Arm Region Severe depletion  Thoracic and Lumbar Region Severe depletion  Buccal Region Severe depletion  [place] Region Severe depletion  Clavicle Bone Region Severe depletion  Clavicle and Acromion Bone Region Severe depletion  Scapular Bone Region Severe depletion  Dorsal Hand Severe depletion  Patellar Region Severe depletion  Anterior  Thigh Region Severe depletion  Posterior Calf Region Severe depletion  Edema (RD Assessment) None  Hair Reviewed  Eyes Reviewed  Mouth Reviewed  Skin Reviewed  Nails Reviewed       Diet Order:   Diet Order             Diet NPO time specified  Diet effective now                   EDUCATION NEEDS:   Not appropriate for education at this time  Skin:  Skin Assessment: Reviewed RN Assessment  Last BM:  4/29  Height:   Ht Readings from Last 1 Encounters:  11/29/22 5\' 1"  (1.549 m)    Weight:   Wt Readings from Last 1 Encounters:  11/29/22 38.9 kg    Ideal Body Weight:     BMI:  Body mass index is 16.2 kg/m.  Estimated Nutritional Needs:   Kcal:  1165-1360 kcals  Protein:  60-70 gm  Fluid:  >/= 1.2 L  Bethann Humble, RD, LDN, CNSC.

## 2022-11-29 NOTE — Assessment & Plan Note (Signed)
Patient with PE left main pulmonary artery extending to the left lower lobe with right heart strain. Likely etiology is malignance related hypercoagulability.  Plan Continue IV heparin for 24-48 hrs  Start NOAC, xarelto, for long term mangement  Medical telemetry admit 2/2 right heart strain.

## 2022-11-29 NOTE — Consult Note (Signed)
Hospital Consult    Reason for Consult:  Pulmonary Embolism with right heart strain.  Requesting Physician:  Dr Willy Eddy MD MRN #:  [id number]  History of Present Illness: Nancy Berg is a 77 y.o. female recent treatment for breast cancer previously on chemotherapy not on anticoagulation but on chronic O2 for history of COPD presents to the ER for evaluation of shortness of breath has been reportedly hypoxic intermittently over the past several days as low as 70 to 80%.  Had another episode where she was hypoxic at rest today and was sent to the ER for further evaluation.   On further workup patient had a CT of the chest which reveals a pulmonary embolism with right heart strain at 1.36.  On exam this morning she is noted to be using accessory muscles to help breathe.  She is on 4 L of nasal cannula oxygen to assist with saturations.  She does endorse shortness of breath today.  He endorses the shortness of breath has been going on for about a month or so.  He has no other complaints this morning.  Denies any chest pain.  She denies any lower extremity pain at rest.  Vitals all remained stable.  Past Medical History:  Diagnosis Date   Arthritis    Cancer (HCC)    CHF (congestive heart failure) (HCC)    COPD (chronic obstructive pulmonary disease) (HCC)    Dementia (HCC)    Hyperlipidemia     Past Surgical History:  Procedure Laterality Date   BREAST BIOPSY Right 10/14/2021   rt 12:00 [place] bx venus clip path pending   BREAST BIOPSY Right 10/14/2021   rt axilla hydromarker path pending   EYE SURGERY Bilateral 2019   cataract   MASTECTOMY MODIFIED RADICAL Right 12/28/2021   Procedure: MASTECTOMY MODIFIED RADICAL;  Surgeon: Carolan Shiver, MD;  Location: [facility];  Service: General;  Laterality: Right;   PORTACATH PLACEMENT N/A 11/25/2021   Procedure: INSERTION PORT-A-CATH;  Surgeon: Carolan Shiver, MD;  Location: [facility];  Service: General;  Laterality: N/A;     Allergies  Allergen Reactions   Penicillins Rash and Other (See Comments)    Prior to Admission medications   Medication Sig Start Date End Date Taking? Authorizing Provider  acetaminophen (TYLENOL) 325 MG tablet Take 650 mg by mouth See admin instructions. Take 2 tablets (650 mg) by mouth 3 times daily scheduled (0800, 1400 & 2000) & take 2 tablets (650 mg) by mouth up to twice daily as needed for pain.   Yes [provider]  albuterol (PROVENTIL) (2.5 MG/3ML) 0.083% nebulizer solution Take 2.5 mg by nebulization every 6 (six) hours as needed for wheezing or shortness of breath. 09/20/22  Yes [provider]  albuterol (VENTOLIN HFA) 108 (90 Base) MCG/ACT inhaler Inhale 2 puffs into the lungs every 6 (six) hours as needed for shortness of breath or wheezing. 05/04/21  Yes [provider]  alendronate (FOSAMAX) 70 MG tablet Take 70 mg by mouth every Monday. 10/04/21  Yes [provider]  ammonium lactate (LAC-HYDRIN) 12 % lotion Apply 1 application. topically daily. (0800) Lower legs.   Yes [provider]  aspirin EC 81 MG tablet Take 81 mg by mouth daily. Swallow whole.   Yes [provider]  atorvastatin (LIPITOR) 20 MG tablet Take 20 mg by mouth daily. (0800) 10/13/21  Yes [provider]  budesonide (PULMICORT) 0.5 MG/2ML nebulizer solution Inhale 0.5 mg into the lungs daily. (0800) 11/15/21 11/29/22 Yes [provider]  calcium carbonate (TUMS EX) 750 MG chewable tablet Chew 1 tablet by mouth in the morning and at bedtime. (0800 & 2000) 06/29/20  Yes [provider]  Cholecalciferol (VITAMIN D3) 50 MCG (2000 UT) TABS Take 2,000 Units by mouth daily. (0800)   Yes [provider]  INCRUSE ELLIPTA 62.5 MCG/ACT AEPB Inhale 1 puff into the lungs daily. (0800) 10/07/21  Yes [provider]  LUMIGAN 0.01 % SOLN Place 1 drop into both eyes at bedtime. (2000) 11/10/21  Yes [provider]   metoprolol succinate (TOPROL-XL) 25 MG 24 hr tablet Take 25 mg by mouth daily. (0800) Hold if SBP<100 or Pulse <60. 10/13/21  Yes [provider]  Multiple Vitamins-Minerals (HEALTHY EYES SUPERVISION 2 PO) Take 1 capsule by mouth in the morning and at bedtime. (0730 & 1730)   Yes [provider]  OXYGEN Inhale 2 L/min into the lungs as needed (shortness of breath OR if pulse ox is <90% on room air).   Yes [provider]  potassium chloride SA (KLOR-CON M) 20 MEQ tablet Take 20 mEq by mouth daily. (0800) 05/13/21  Yes [provider]  guaiFENesin (MUCINEX) 600 MG 12 hr tablet Take 600 mg by mouth 2 (two) times daily as needed (congestion.). Patient not taking: Reported on 03/07/2022    [provider]  Magnesium 125 MG CAPS Take 1 capsule by mouth daily. Patient not taking: Reported on 11/29/2022    [provider]  mirtazapine (REMERON) 15 MG tablet Take 15 mg by mouth at bedtime.    [provider]  prochlorperazine (COMPAZINE) 10 MG tablet Take 1 tablet (10 mg total) by mouth every 6 (six) hours as needed (Nausea or vomiting). Patient not taking: Reported on 03/07/2022 11/26/21 04/05/22  Creig Hines, MD    Social History   Socioeconomic History   Marital status: Divorced    Spouse name: Not on file   Number of children: Not on file   Years of education: Not on file   Highest education level: Not on file  Occupational History   Not on file  Tobacco Use   Smoking status: Former    Types: Cigarettes    Quit date: 10/2019    Years since quitting: 3.0    Passive exposure: Past   Smokeless tobacco: Never  Vaping Use   Vaping Use: Never used  Substance and Sexual Activity   Alcohol use: Not Currently   Drug use: Not Currently   Sexual activity: Not Currently  Other Topics Concern   Not on file  Social History Narrative   Not on file   Social Determinants of Health   Financial Resource Strain: Not on file  Food  Insecurity: No Food Insecurity (11/29/2022)   Hunger Vital Sign    Worried About Running Out of Food in the Last Year: Never true    Ran Out of Food in the Last Year: Never true  Transportation Needs: No Transportation Needs (11/29/2022)   PRAPARE - Administrator, Civil Service (Medical): No    Lack of Transportation (Non-Medical): No  Physical Activity: Not on file  Stress: Not on file  Social Connections: Not on file  Intimate Partner Violence: Not At Risk (11/29/2022)   Humiliation, Afraid, Rape, and Kick questionnaire    Fear of Current or Ex-Partner: No    Emotionally Abused: No    Physically Abused: No    Sexually Abused: No     Family History  Problem Relation Age of Onset   Breast cancer Mother        dx 62s   Lung cancer Sister 56    ROS: Otherwise negative unless mentioned in HPI  Physical Examination  Vitals:   11/29/22 0744 11/29/22 0750  BP: (!) 107/55   Pulse: 66 64  Resp: 16   Temp: 98.7 F (37.1 C)   SpO2: (!) 84% (!) 89%   Body mass index is 16.2 kg/m.  General:  WDWN in NAD Gait: Not observed HENT: WNL, normocephalic Pulmonary: normal non-labored breathing, without Rales, rhonchi,  wheezing Cardiac: regular, without  Murmurs, rubs or gallops; without carotid bruits Abdomen: Positive bowel sounds, soft, NT/ND, no masses Skin: without rashes Vascular Exam/Pulses: Positive palpable pulses throughout. Extremities: without ischemic changes, without Gangrene , without cellulitis; without open wounds;  Musculoskeletal: no muscle wasting or atrophy  Neurologic: A&O X 3;  No focal weakness or paresthesias are detected; speech is fluent/normal Psychiatric:  The pt has Abnormal- Flat  affect. Per Shara Blazing who is POA patient has moderate dementia.  Lymph:  Unremarkable  CBC    Component Value Date/Time   WBC 11.8 (H) 11/28/2022 1451   RBC 4.26 11/28/2022 1451   HGB 13.2 11/28/2022 1451   HCT 42.6 11/28/2022 1451   PLT 245 11/28/2022  1451   MCV 100.0 11/28/2022 1451   MCH 31.0 11/28/2022 1451   MCHC 31.0 11/28/2022 1451   RDW 15.1 11/28/2022 1451   LYMPHSABS 1.1 08/30/2022 1304   MONOABS 1.0 08/30/2022 1304   EOSABS 0.3 08/30/2022 1304   BASOSABS 0.1 08/30/2022 1304    BMET    Component Value Date/Time   NA 140 11/28/2022 1451   K 3.9 11/28/2022 1451   CL 100 11/28/2022 1451   CO2 31 11/28/2022 1451   GLUCOSE 111 (H) 11/28/2022 1451   BUN 20 11/28/2022 1451   CREATININE 0.77 11/28/2022 1451   CALCIUM 9.6 11/28/2022 1451   GFRNONAA >60 11/28/2022 1451    COAGS: Lab Results  Component Value Date   INR 1.0 11/28/2022     Non-Invasive Vascular Imaging:   EXAM: CT ANGIOGRAPHY CHEST WITH CONTRAST   TECHNIQUE: Multidetector CT imaging of the chest was performed using the standard protocol during bolus administration of intravenous contrast. Multiplanar CT image reconstructions and MIPs were obtained to evaluate the vascular anatomy.   RADIATION DOSE REDUCTION: This exam was performed according to the departmental dose-optimization program which includes automated exposure control, adjustment of the mA and/or kV according to patient size and/or use of iterative reconstruction technique.   CONTRAST:  75mL OMNIPAQUE IOHEXOL 350 MG/ML SOLN   COMPARISON:  Chest CT 06/30/2022.  Chest x-ray same day.   FINDINGS: Cardiovascular: Satisfactory opacification of the pulmonary arteries to the segmental level. There is pulmonary embolism in the left main pulmonary artery extending into left lower lobar branch.   Normal heart size. No pericardial effusion. There are atherosclerotic calcifications of the aorta and coronary arteries.   Mediastinum/Nodes: No enlarged mediastinal, hilar, or axillary lymph nodes. Thyroid gland, trachea, and esophagus demonstrate no significant findings.   Lungs/Pleura: Severe emphysematous changes are again seen there are small bilateral pleural effusions. There is chronic  appearing atelectasis in the left lower lobe. This is unchanged from prior.   Upper Abdomen: No acute abnormality.   Musculoskeletal: No chest wall abnormality. No acute or significant osseous findings.   Review of the MIP images confirms the above findings.   IMPRESSION: 1. Pulmonary embolism in the  left main pulmonary artery extending into the left lower lobar branch. Positive for acute PE with CTevidence of right heart strain (RV/LV Ratio = 1.36) consistent with at least submassive (intermediate risk) PE. The presence of right heart strain has been associated with an increased risk of morbidity and mortality. 2. Small bilateral pleural effusions. 3. Stable chronic appearing atelectasis in the left lower lobe.  Statin:  Yes.   Beta Blocker:  Yes.   Aspirin:  Yes.   ACEI:  No. ARB:  No. CCB use:  No Other antiplatelets/anticoagulants:  No.    ASSESSMENT/PLAN: This is a 77 y.o. female who had recent treatment for breast cancer previously on chemotherapy not on anticoagulation but on chronic O2 for history of COPD presents to the ER for evaluation of shortness of breath has been reportedly hypoxic intermittently over the past several days as low as 70 to 80%. Upon workup was found to have a pulmonary embolism with right heart strain.   Plan is to take the patient to the vascular lab this afternoon for a pulmonary thrombectomy. I spoke in detail with the patient and her son, who is POA due to her moderate dementia, about the procedure, benefits, risks and complications. They verbalized there understanding and wish to proceed this afternoon. All questions have been answered. Vitals all remain stable.    -I discussed the plan in detail with Dr Festus Barren MD and he is in agreement with the plan.   Marcie Bal [facility] 11/29/2022 8:42 AM

## 2022-11-29 NOTE — TOC Initial Note (Signed)
Transition of Care [facility]) - Initial/Assessment Note    Patient Details  Name: Malayja Freund MRN: [id number] Date of Birth: Mar 22, 1946  [facility] CM/SW Contact:    Allena Katz, LCSW Phone Number: 11/29/2022, 2:27 PM  Clinical Narrative:  [facility] consulted for SNF placement. PT/OT ordered. TOC will  need SNF recommendations prior to starting SNF workup. TOC continuing to follow                       Patient Goals and CMS Choice            Expected Discharge Plan and Services                                              Prior Living Arrangements/Services                       Activities of Daily Living Home Assistive Devices/Equipment: Dan Humphreys (specify type) ADL Screening (condition at time of admission) Patient's cognitive ability adequate to safely complete daily activities?: Yes Is the patient deaf or have difficulty hearing?: No Does the patient have difficulty seeing, even when wearing glasses/contacts?: No Does the patient have difficulty concentrating, remembering, or making decisions?: Yes Patient able to express need for assistance with ADLs?: Yes Does the patient have difficulty dressing or bathing?: Yes Independently performs ADLs?: No Communication: Independent Dressing (OT): Needs assistance Is this a change from baseline?: Pre-admission baseline Grooming: Needs assistance Is this a change from baseline?: Pre-admission baseline Feeding: Independent Bathing: Needs assistance Is this a change from baseline?: Pre-admission baseline Toileting: Needs assistance Is this a change from baseline?: Pre-admission baseline In/Out Bed: Needs assistance Is this a change from baseline?: Pre-admission baseline Walks in Home: Needs assistance Is this a change from baseline?: Pre-admission baseline Does the patient have difficulty walking or climbing stairs?: Yes Weakness of Legs: Both Weakness of Arms/Hands: None  Permission  Sought/Granted                  Emotional Assessment              Admission diagnosis:  Pulmonary embolism (HCC) [I26.99] Acute on chronic respiratory failure with hypoxia (HCC) [J96.21] Other acute pulmonary embolism, unspecified whether acute cor pulmonale present (HCC) [I26.99] Patient Active Problem List   Diagnosis Date Noted   Acute pulmonary embolism (HCC) 11/29/2022   Protein calorie malnutrition (HCC) 11/29/2022   Pulmonary embolism (HCC) 11/29/2022   Genetic testing 07/12/2022   Type 2 diabetes mellitus without complications (HCC) 02/21/2022   Heart failure (HCC) 02/21/2022   Solitary pulmonary nodule 02/21/2022   Breast cancer (HCC) 12/28/2021   Other vitamin B12 deficiency anemias 11/15/2021   Osteoarthritis 11/15/2021   Malignant neoplasm of upper-outer quadrant of right breast in female, estrogen receptor negative (HCC) 10/27/2021   Goals of care, counseling/discussion 10/27/2021   Vitamin D deficiency 02/06/2020   Mass of neck 02/06/2020   Age-related osteoporosis without current pathological fracture 02/06/2020   Chronic obstructive pulmonary disease, unspecified (HCC) 02/06/2020   Hyperlipidemia 02/06/2020   Periodontal disease 02/06/2020   Primary osteoarthritis of both knees 02/06/2020   PCP:  Ellan Lambert, NP Pharmacy:   [facility] - [place], [place] - [address] [place] [address] Phone: [phone] Fax: [phone]     Social  Determinants of Health (SDOH) Social History: SDOH Screenings   Food Insecurity: No Food Insecurity (11/29/2022)  Housing: Low Risk  (11/29/2022)  Transportation Needs: No Transportation Needs (11/29/2022)  Utilities: Not At Risk (11/29/2022)  Tobacco Use: Medium Risk (11/29/2022)   SDOH Interventions:     Readmission Risk Interventions     No data to display

## 2022-11-29 NOTE — Assessment & Plan Note (Signed)
Chart reveiwed-serum glucose levels 111 to 140's. Patient on no antiglycemic meds  Plan A1C  Low carb diet

## 2022-11-29 NOTE — Assessment & Plan Note (Signed)
Patient s/p mastectomy, adjuvant XRT and a course of carbo-taxol chemotherapy with addition of Keytruda.  Plan Per oncology

## 2022-11-29 NOTE — H&P (Signed)
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 She is continue on immunologic therapy for her breast cancer. At the long-term care facility where she resides she was noted to have increased hypoxemia with O2 sats in the 70-80's. Due to persistent hypoxemia she was transported to [facility] for evaluation.    ED Course: Afebrile, 119/58  82  RR 18. Chroncally ill, emaciated woman presenting SOB with hypoxemia  on 2L  oxygen. EDP exam unremarkable. Lab: VBG 7.45/55/<31, Glucose 111, lactic acid 1.2  WBC 11.8, Hgb 13.2, Plt 245. CXR increased basilar markings c/w atelectasis vs PNA. CTA - PE Left main pulmnary artery extending to left lower lobe with evidence of right heart strain; severe emphysematous changes. In ED patient started on IV heparin. TRH called to admit for continued management.   Review of Systems: patient with cognitive impairment (mild dementia) Review of Systems  Constitutional:  Positive for fever. Negative for chills and weight loss.  HENT: Negative.    Eyes: Negative.   Respiratory:  Positive for shortness of breath. Negative for cough, sputum production and wheezing.   Cardiovascular:  Negative for chest pain, palpitations and leg swelling.  Gastrointestinal: Negative.   Genitourinary: Negative.   Musculoskeletal: Negative.   Skin: Negative.   Neurological: Negative.   Endo/Heme/Allergies: Negative.   Psychiatric/Behavioral: Negative.      Past Medical History:  Diagnosis Date   Arthritis    Cancer (HCC)    CHF (congestive heart failure) (HCC)     COPD (chronic obstructive pulmonary disease) (HCC)    Dementia (HCC)    Hyperlipidemia     Past Surgical History:  Procedure Laterality Date   BREAST BIOPSY Right 10/14/2021   rt 12:00 [place] bx venus clip path pending   BREAST BIOPSY Right 10/14/2021   rt axilla hydromarker path pending   EYE SURGERY Bilateral 2019   cataract   MASTECTOMY MODIFIED RADICAL Right 12/28/2021   Procedure: MASTECTOMY MODIFIED RADICAL;  Surgeon: Carolan Shiver, MD;  Location: [facility] ORS;  Service: General;  Laterality: Right;   PORTACATH PLACEMENT N/A 11/25/2021   Procedure: INSERTION PORT-A-CATH;  Surgeon: Carolan Shiver, MD;  Location: [facility] ORS;  Service: General;  Laterality: N/A;    Soc Hx - marriage #1 15 years - divorced; marriage #2 10 years divorced. She has one son, one daughter, 5 grandchildren. She did do public work Designer, jewellery. Lives in long term care faciity   reports that she quit smoking about 3 years ago. Her smoking use included cigarettes. She has been exposed to tobacco smoke. She has never used smokeless tobacco. She reports that she does not currently use alcohol. She reports that she does not currently use drugs.  Allergies  Allergen Reactions   Penicillins Rash and Other (See Comments)    Family History  Problem Relation Age of Onset   Breast cancer Mother        dx 64s   Lung cancer Sister 61    Prior to Admission medications   Medication Sig Start Date End Date Taking? Authorizing Provider  acetaminophen (TYLENOL) 325 MG tablet Take 650  mg by mouth See admin instructions. Take 2 tablets (650 mg) by mouth 3 times daily scheduled (0800, 1400 & 2000) & take 2 tablets (650 mg) by mouth up to twice daily as needed for pain.    [provider]  albuterol (VENTOLIN HFA) 108 (90 Base) MCG/ACT inhaler Inhale 2 puffs into the lungs every 6 (six) hours as needed for shortness of breath or wheezing. 05/04/21   [provider]  Albuterol Sulfate 2.5 MG/0.5ML  NEBU Inhale into the lungs as needed.    [provider]  alendronate (FOSAMAX) 70 MG tablet Take 70 mg by mouth every Monday. 10/04/21   [provider]  ammonium lactate (LAC-HYDRIN) 12 % lotion Apply 1 application. topically daily. (0800) Lower legs.    [provider]  aspirin EC 81 MG tablet Take 81 mg by mouth daily. Swallow whole.    [provider]  atorvastatin (LIPITOR) 20 MG tablet Take 20 mg by mouth daily. (0800) 10/13/21   [provider]  budesonide (PULMICORT) 0.5 MG/2ML nebulizer solution Inhale 0.5 mg into the lungs daily. (0800) 11/15/21 11/15/22  [provider]  calcium carbonate (TUMS EX) 750 MG chewable tablet Chew 1 tablet by mouth in the morning and at bedtime. (0800 & 2000) 06/29/20   [provider]  Cholecalciferol (VITAMIN D3) 50 MCG (2000 UT) TABS Take 2,000 Units by mouth daily. (0800)    [provider]  guaiFENesin (MUCINEX) 600 MG 12 hr tablet Take 600 mg by mouth 2 (two) times daily as needed (congestion.). Patient not taking: Reported on 03/07/2022    [provider]  INCRUSE ELLIPTA 62.5 MCG/ACT AEPB Inhale 1 puff into the lungs daily. (0800) 10/07/21   [provider]  LUMIGAN 0.01 % SOLN Place 1 drop into both eyes at bedtime. (2000) 11/10/21   [provider]  Magnesium 125 MG CAPS Take 1 capsule by mouth daily.    [provider]  metoprolol succinate (TOPROL-XL) 25 MG 24 hr tablet Take 25 mg by mouth daily. (0800) Hold if SBP<100 or Pulse <60. Patient not taking: Reported on 08/30/2022 10/13/21   [provider]  Multiple Vitamins-Minerals (HEALTHY EYES SUPERVISION 2 PO) Take 1 capsule by mouth in the morning and at bedtime. (0730 & 1730)    [provider]  OXYGEN Inhale 2 L/min into the lungs as needed (shortness of breath OR if pulse ox is <90% on room air).    [provider]  potassium chloride SA (KLOR-CON M) 20 MEQ tablet Take 20  mEq by mouth daily. (0800) 05/13/21   [provider]  prochlorperazine (COMPAZINE) 10 MG tablet Take 1 tablet (10 mg total) by mouth every 6 (six) hours as needed (Nausea or vomiting). Patient not taking: Reported on 03/07/2022 11/26/21 04/05/22  Creig Hines, MD    Physical Exam: Vitals:   11/28/22 2230 11/28/22 2300 11/28/22 2330 11/29/22 0100  BP: (!) 136/48  (!) 119/58 (!) 99/53  Pulse: 79  82 86  Resp:   18 (!) 21  Temp:      TempSrc:      SpO2: 95%  97% 94%  Weight:  38.6 kg      Physical Exam Vitals and nursing note reviewed.  Constitutional:      General: She is not in acute distress.    Appearance: She is ill-appearing. She is not toxic-appearing.     Comments: Poor historian in regard to health history.   Appears emaciated.  HENT:     Head: Normocephalic and atraumatic.     Comments: Temporal wasting noted    Mouth/Throat:     Mouth: Mucous membranes are moist.     Pharynx: Oropharynx is clear. No oropharyngeal exudate or posterior oropharyngeal erythema.     Comments: Native dentition Eyes:     Extraocular Movements: Extraocular movements intact.     Conjunctiva/sclera: Conjunctivae normal.     Pupils: Pupils are equal, round, and reactive to light.  Cardiovascular:     Rate and Rhythm: Normal rate and regular rhythm.     Pulses: Normal pulses.     Heart sounds: Normal heart sounds.  Pulmonary:     Effort: Pulmonary effort is normal.     Breath sounds: No wheezing, rhonchi or rales.     Comments: Decreased breath sounds at bases. Prolonged expiratory phase Abdominal:     General: Bowel sounds are normal. There is no distension.     Palpations: Abdomen is soft.     Tenderness: There is no abdominal tenderness.  Musculoskeletal:        General: Normal range of motion.     Cervical back: No rigidity.     Comments: Hammer toe deformities both feet. Varus deformity left great toe.  Lymphadenopathy:     Cervical: No cervical adenopathy.  Skin:     General: Skin is warm and dry.     Coloration: Skin is not jaundiced.     Findings: No bruising or erythema.  Neurological:     General: No focal deficit present.     Mental Status: She is alert.     Cranial Nerves: No cranial nerve deficit.  Psychiatric:        Mood and Affect: Mood normal.        Behavior: Behavior normal.      Labs on Admission: I have personally reviewed following labs and imaging studies  CBC: Recent Labs  Lab 11/28/22 1451  WBC 11.8*  HGB 13.2  HCT 42.6  MCV 100.0  PLT 245   Basic Metabolic Panel: Recent Labs  Lab 11/28/22 1451  NA 140  K 3.9  CL 100  CO2 31  GLUCOSE 111*  BUN 20  CREATININE 0.77  CALCIUM 9.6   GFR: Estimated Creatinine Clearance: 35.9 mL/min (by C-G formula based on SCr of 0.77 mg/dL). Liver Function Tests: No results for input(s): "AST", "ALT", "ALKPHOS", "BILITOT", "PROT", "ALBUMIN" in the last 168 hours. No results for input(s): "LIPASE", "AMYLASE" in the last 168 hours. No results for input(s): "AMMONIA" in the last 168 hours. Coagulation Profile: Recent Labs  Lab 11/28/22 2344  INR 1.0   Cardiac Enzymes: No results for input(s): "CKTOTAL", "CKMB", "CKMBINDEX", "TROPONINI" in the last 168 hours. BNP (last 3 results) No results for input(s): "PROBNP" in the last 8760 hours. HbA1C: No results for input(s): "HGBA1C" in the last 72 hours. CBG: No results for input(s): "GLUCAP" in the last 168 hours. Lipid Profile: No results for input(s): "CHOL", "HDL", "LDLCALC", "TRIG", "CHOLHDL", "LDLDIRECT" in the last 72 hours. Thyroid Function Tests: No results for input(s): "TSH", "T4TOTAL", "FREET4", "T3FREE", "THYROIDAB" in the last 72 hours. Anemia Panel: No results for input(s): "VITAMINB12", "FOLATE", "FERRITIN", "TIBC", "IRON", "RETICCTPCT" in the last 72 hours. Urine analysis:    Component Value Date/Time   COLORURINE YELLOW (A) 07/12/2020 1256   APPEARANCEUR HAZY (A) 07/12/2020 1256   LABSPEC 1.011 07/12/2020  1256   PHURINE 7.0 07/12/2020 1256   GLUCOSEU NEGATIVE 07/12/2020 1256   HGBUR  SMALL (A) 07/12/2020 1256   BILIRUBINUR NEGATIVE 07/12/2020 1256   KETONESUR NEGATIVE 07/12/2020 1256   PROTEINUR 100 (A) 07/12/2020 1256   NITRITE NEGATIVE 07/12/2020 1256   LEUKOCYTESUR NEGATIVE 07/12/2020 1256    Radiological Exams on Admission: I have personally reviewed images CT Angio Chest PE W and/or Wo Contrast  Addendum Date: 11/28/2022   ADDENDUM REPORT: 11/28/2022 23:49 ADDENDUM: These results were called by telephone at the time of interpretation on 11/28/2022 at 11:25 p.m. to provider Willy Eddy , who verbally acknowledged these results. Electronically Signed   By: Darliss Cheney M.D.   On: 11/28/2022 23:49   Result Date: 11/28/2022 CLINICAL DATA:  High probability for PE.  Shortness of breath. EXAM: CT ANGIOGRAPHY CHEST WITH CONTRAST TECHNIQUE: Multidetector CT imaging of the chest was performed using the standard protocol during bolus administration of intravenous contrast. Multiplanar CT image reconstructions and MIPs were obtained to evaluate the vascular anatomy. RADIATION DOSE REDUCTION: This exam was performed according to the departmental dose-optimization program which includes automated exposure control, adjustment of the mA and/or kV according to patient size and/or use of iterative reconstruction technique. CONTRAST:  75mL OMNIPAQUE IOHEXOL 350 MG/ML SOLN COMPARISON:  Chest CT 06/30/2022.  Chest x-ray same day. FINDINGS: Cardiovascular: Satisfactory opacification of the pulmonary arteries to the segmental level. There is pulmonary embolism in the left main pulmonary artery extending into left lower lobar branch. Normal heart size. No pericardial effusion. There are atherosclerotic calcifications of the aorta and coronary arteries. Mediastinum/Nodes: No enlarged mediastinal, hilar, or axillary lymph nodes. Thyroid gland, trachea, and esophagus demonstrate no significant findings. Lungs/Pleura:  Severe emphysematous changes are again seen there are small bilateral pleural effusions. There is chronic appearing atelectasis in the left lower lobe. This is unchanged from prior. Upper Abdomen: No acute abnormality. Musculoskeletal: No chest wall abnormality. No acute or significant osseous findings. Review of the MIP images confirms the above findings. IMPRESSION: 1. Pulmonary embolism in the left main pulmonary artery extending into the left lower lobar branch. Positive for acute PE with CTevidence of right heart strain (RV/LV Ratio = 1.36) consistent with at least submassive (intermediate risk) PE. The presence of right heart strain has been associated with an increased risk of morbidity and mortality. 2. Small bilateral pleural effusions. 3. Stable chronic appearing atelectasis in the left lower lobe. Aortic Atherosclerosis (ICD10-I70.0). Electronically Signed: By: Darliss Cheney M.D. On: 11/28/2022 23:24   DG Chest 2 View  Result Date: 11/28/2022 CLINICAL DATA:  Shortness of breath, hypoxia EXAM: CHEST - 2 VIEW COMPARISON:  11/25/2021 FINDINGS: Cardiac size is within normal limits. Left hemidiaphragm is elevated which may be due to eventration or paralysis. Linear densities in the left lower lung field may be related to elevation of left hemidiaphragm. There is blunting of both lateral CP angles. Small linear densities are seen in right lower lung field. There is poor inspiration. There is prominence of both hilar regions. There is no pneumothorax. IMPRESSION: There are no signs of pulmonary edema or focal pulmonary consolidation. Increased markings are seen in both lower lung fields which may be due to poor inspiration or suggest atelectasis/pneumonia. There is blunting of both lateral CP angles suggesting small bilateral pleural effusions. Both hilar regions are prominent. This may be due to pulmonary arterial hypertension or suggest lymphadenopathy. Short-term follow-up chest radiographs along with CT  if warranted may be considered. Electronically Signed   By: Ernie Avena M.D.   On: 11/28/2022 15:26    EKG: I have personally reviewed  EKG: NSR, no acute changes  Assessment/Plan Active Problems:   Acute pulmonary embolism (HCC)   Protein calorie malnutrition (HCC)   Chronic obstructive pulmonary disease, unspecified (HCC)   Breast cancer (HCC)   Type 2 diabetes mellitus without complications (HCC)   Heart failure (HCC)    Assessment and Plan: Acute pulmonary embolism (HCC) Patient with PE left main pulmonary artery extending to the left lower lobe with right heart strain. Likely etiology is malignance related hypercoagulability.  Plan Continue IV heparin for 24-48 hrs  Start NOAC, xarelto, for long term mangement  Medical telemetry admit 2/2 right heart strain.  Protein calorie malnutrition (HCC) Patient very thin - appears emaciated.  Plan Total protein and prealbumin  RD consult  Heart failure [facility]) Patient has seen Dr. Juliann Pares for pulmonary hypertension and abnormal Echo done 06/03/22 which revealed EF 60-65% and indeterminant diastolic function. Rrecommendation was for continued medical management.   Plan Continue home regimen  Type 2 diabetes mellitus without complications (HCC) Chart reveiwed-serum glucose levels 111 to 140's. Patient on no antiglycemic meds  Plan A1C  Low carb diet  Breast cancer [facility]) Patient s/p mastectomy, adjuvant XRT and a course of carbo-taxol chemotherapy with addition of Keytruda.  Plan Per oncology  Chronic obstructive pulmonary disease, unspecified (HCC) Patient followed by Dr. Mayo Ao, pulmonologist. She has severe emphysema. Last OV 10/14/22 - GOLD stage  III-IV stble on her current regimen and O2 at 2L:Marland Kitchen  Plan Continue home regimen of inhalational meds  Oxygen at 2-4 l [place] to keep sats > 88%  Consult from pulmonary service due to large left main PE with strain       DVT prophylaxis: IV heparin gtts Code Status: Full  Code Family Communication: deferred to later in the day  Disposition Plan: return to long term care when stable  Consults called: Pulmonary - staff message to Dr. Meredeth Ide  Admission status: Inpatient, Telemetry bed   Illene Regulus, MD [facility] 11/29/2022, 1:42 AM

## 2022-11-30 ENCOUNTER — Encounter: Payer: Self-pay | Admitting: Oncology

## 2022-11-30 ENCOUNTER — Inpatient Hospital Stay: Payer: Medicare HMO

## 2022-11-30 ENCOUNTER — Encounter: Payer: Self-pay | Admitting: Vascular Surgery

## 2022-11-30 ENCOUNTER — Inpatient Hospital Stay (HOSPITAL_COMMUNITY)
Admit: 2022-11-30 | Discharge: 2022-11-30 | Disposition: A | Discharge status: Home / Self Care | Payer: Medicare HMO | Attending: Student | Admitting: Student

## 2022-11-30 ENCOUNTER — Telehealth (HOSPITAL_COMMUNITY): Payer: Self-pay | Admitting: Pharmacy Technician

## 2022-11-30 ENCOUNTER — Other Ambulatory Visit (HOSPITAL_COMMUNITY): Payer: Self-pay

## 2022-11-30 DIAGNOSIS — I5021 Acute systolic (congestive) heart failure: Secondary | ICD-10-CM | POA: Diagnosis not present

## 2022-11-30 DIAGNOSIS — Z9889 Other specified postprocedural states: Secondary | ICD-10-CM

## 2022-11-30 DIAGNOSIS — Z7901 Long term (current) use of anticoagulants: Secondary | ICD-10-CM

## 2022-11-30 DIAGNOSIS — I2699 Other pulmonary embolism without acute cor pulmonale: Secondary | ICD-10-CM | POA: Diagnosis not present

## 2022-11-30 DIAGNOSIS — I2609 Other pulmonary embolism with acute cor pulmonale: Secondary | ICD-10-CM | POA: Diagnosis not present

## 2022-11-30 DIAGNOSIS — J9621 Acute and chronic respiratory failure with hypoxia: Secondary | ICD-10-CM

## 2022-11-30 DIAGNOSIS — Z9981 Dependence on supplemental oxygen: Secondary | ICD-10-CM | POA: Diagnosis not present

## 2022-11-30 LAB — CBC
HCT: 36.7 % (ref 36.0–46.0)
Hemoglobin: 11.5 g/dL — ABNORMAL LOW (ref 12.0–15.0)
MCH: 31.3 pg (ref 26.0–34.0)
MCHC: 31.3 g/dL (ref 30.0–36.0)
MCV: 99.7 fL (ref 80.0–100.0)
Platelets: 214 10*3/uL (ref 150–400)
RBC: 3.68 MIL/uL — ABNORMAL LOW (ref 3.87–5.11)
RDW: 15.4 % (ref 11.5–15.5)
WBC: 12.6 10*3/uL — ABNORMAL HIGH (ref 4.0–10.5)
nRBC: 0 % (ref 0.0–0.2)

## 2022-11-30 LAB — BASIC METABOLIC PANEL
Anion gap: 7 (ref 5–15)
BUN: 24 mg/dL — ABNORMAL HIGH (ref 8–23)
CO2: 32 mmol/L (ref 22–32)
Calcium: 8.7 mg/dL — ABNORMAL LOW (ref 8.9–10.3)
Chloride: 102 mmol/L (ref 98–111)
Creatinine, Ser: 0.55 mg/dL (ref 0.44–1.00)
GFR, Estimated: >60 mL/min (ref >60)
Glucose, Bld: 109 mg/dL — ABNORMAL HIGH (ref 70–99)
Potassium: 3.6 mmol/L (ref 3.5–5.1)
Sodium: 141 mmol/L (ref 135–145)

## 2022-11-30 LAB — BLOOD GAS, VENOUS
Acid-Base Excess: 11.9 mmol/L — ABNORMAL HIGH (ref 0.0–2.0)
Bicarbonate: 38.2 mmol/L — ABNORMAL HIGH (ref 20.0–28.0)
O2 Saturation: 29.1 %
Patient temperature: 37
pCO2, Ven: 55 mmHg (ref 44–60)

## 2022-11-30 LAB — MAGNESIUM: Magnesium: 1.7 mg/dL (ref 1.7–2.4)

## 2022-11-30 LAB — ECHOCARDIOGRAM COMPLETE: Height: 61 in

## 2022-11-30 LAB — PHOSPHORUS: Phosphorus: 3.9 mg/dL (ref 2.5–4.6)

## 2022-11-30 LAB — HEPARIN LEVEL (UNFRACTIONATED)
Heparin Unfractionated: 0.36 IU/mL (ref 0.30–0.70)
Heparin Unfractionated: 0.35 IU/mL (ref 0.30–0.70)

## 2022-11-30 MED ORDER — APIXABAN 5 MG PO TABS: 5.0000 mg | ORAL_TABLET | Freq: Two times a day (BID) | ORAL | Status: DC

## 2022-11-30 MED ORDER — APIXABAN 5 MG PO TABS
10.0000 mg | ORAL_TABLET | Freq: Two times a day (BID) | ORAL | Status: DC
  Administered 2022-11-30 – 2022-12-03 (×7): 10 mg via ORAL
  Filled 2022-11-30 (×7): qty 2

## 2022-11-30 NOTE — Telephone Encounter (Signed)
Pharmacy Patient Advocate Encounter  Insurance verification completed.    The patient is insured through [employer] Medicare Part D   The patient is currently admitted and ran test claims for the following: Eliquis .  Copays and coinsurance results were relayed to Inpatient clinical team.  

## 2022-11-30 NOTE — Progress Notes (Signed)
[facility] Progress Note  Patient: Nancy Berg    ZOX:[id number]  DOA: 11/28/2022     Date of Service: the patient was seen and examined on 11/30/2022  Chief Complaint  Patient presents with   Low O2   Brief hospital course: Ms. 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 She is continue on immunologic therapy for her breast cancer. At the long-term care facility where she resides she was noted to have increased hypoxemia with O2 sats in the 70-80's. Due to persistent hypoxemia she was transported to [facility] for evaluation.     ED Course: Afebrile, 119/58  82  RR 18. Chroncally ill, emaciated woman presenting SOB with hypoxemia  on 2L [place] oxygen. EDP exam unremarkable. Lab: VBG 7.45/55/<31, Glucose 111, lactic acid 1.2  WBC 11.8, Hgb 13.2, Plt 245. CXR increased basilar markings c/w atelectasis vs PNA. CTA - PE Left main pulmnary artery extending to left lower lobe with evidence of right heart strain; severe emphysematous changes. In ED patient started on IV heparin. TRH called to admit for continued management.   Assessment and Plan:  #Acute pulmonary embolism with right heart strain CTA reviewed as above S/p heparin IV infusion, 5/1 started Eliquis 10 mg p.o. twice daily for 7 days followed by 5 mg p.o. twice daily Continue supplemental O2 inhalation and gradually wean down to 2L baseline Follow 2D echocardiogram venous duplex lower extremity negative for DVT Vascular surgery consulted, s/p successful thrombectomy was done on 4/30, patient tolerated procedure well.     [place], HLD, Chronic diastolic CHF, no exacerbation noticed Continue to monitor  PVD, continue aspirin and statin  COPD, no exacerbation noticed Patient follows Dr. Meredeth Ide pulmonologist for severe emphysema, last OV 10/14/2022-goal discharge 3-4 stable on current regimen and 2  L oxygen Continue Pulmicort inhaler daily, Incruse Ellipta inhaler daily and albuterol nebulizer as needed  Breast cancer  Patient s/p mastectomy, adjuvant XRT and a course of carbo-taxol chemotherapy with addition of Keytruda. F/u out pt as Per oncology   Severe protein calorie malnutrition  Patient very thin - appears emaciated. Plan     Total protein and prealbumin RD consult   Body mass index is 16.2 kg/m.  Interventions:     Diet: Regular diet DVT Prophylaxis: Therapeutic Anticoagulation with Eliquis    Advance goals of care discussion: Full code  Family Communication: family was present at bedside, at the time of interview.  The pt provided permission to discuss medical plan with the family. Opportunity was given to ask question and all questions were answered satisfactorily.   Disposition:  Pt is from SNF, admitted with pulmonary embolism with right heart strain, vascular surgery consulted s/p thrombectomy done on 4/30, s/p heparin IV infusion, awaiting for PT and OT eval, plan is to discharge most likely tomorrow a.m.   Discharge to ALF vs SNF TBD after PT/OT eval. TOC consulted for discharge planning   Subjective: No significant events overnight, patient's shortness of breath is improving, denies any palpitations and chest pain, no any other active issues.  Resting comfortably on the bed.   Physical Exam: General: NAD, lying comfortably Appear in no distress, affect appropriate Eyes: PERRLA ENT: Oral Mucosa Clear, moist  Neck: no JVD,  Cardiovascular: S1 and S2 Present, no Murmur,  Respiratory: good respiratory effort, Bilateral Air entry equal and Decreased, no Crackles, no wheezes Abdomen: Bowel Sound present, Soft and no  tenderness,  Skin: no rashes Extremities: no Pedal edema, no calf tenderness Neurologic: without any new focal findings Gait not checked due to patient safety concerns  Vitals:   11/29/22 2053 11/30/22 0420 11/30/22 0808 11/30/22 1141   BP: 102/63 109/62 121/64 97/60  Pulse: 79 89 89 83  Resp: 18 18 19 18   Temp: 97.8 F (36.6 C) (!) 97.4 F (36.3 C) 97.7 F (36.5 C) 98.4 F (36.9 C)  TempSrc: Oral Oral    SpO2: 96% 100% 97% 98%  Weight:      Height:        Intake/Output Summary (Last 24 hours) at 11/30/2022 1553 Last data filed at 11/30/2022 1433 Gross per 24 hour  Intake 811.56 ml  Output 200 ml  Net 611.56 ml   Filed Weights   11/28/22 2300 11/29/22 0410  Weight: 38.6 kg 38.9 kg    Data Reviewed: I have personally reviewed and interpreted daily labs, tele strips, imagings as discussed above. I reviewed all nursing notes, pharmacy notes, vitals, pertinent old records I have discussed plan of care as described above with RN and patient/family.  CBC: Recent Labs  Lab 11/28/22 1451 11/29/22 0829 11/30/22 0055  WBC 11.8* 10.6* 12.6*  HGB 13.2 12.4 11.5*  HCT 42.6 39.6 36.7  MCV 100.0 99.0 99.7  PLT 245 213 214   Basic Metabolic Panel: Recent Labs  Lab 11/28/22 1451 11/30/22 0055  NA 140 141  K 3.9 3.6  CL 100 102  CO2 31 32  GLUCOSE 111* 109*  BUN 20 24*  CREATININE 0.77 0.55  CALCIUM 9.6 8.7*  MG  --  1.7  PHOS  --  3.9    Studies: US Venous Img Lower Bilateral (DVT)  Result Date: 11/30/2022 CLINICAL DATA:  Pulmonary embolism.  Evaluate for residual DVT. EXAM: BILATERAL LOWER EXTREMITY VENOUS DOPPLER ULTRASOUND TECHNIQUE: Gray-scale sonography with graded compression, as well as color Doppler and duplex ultrasound were performed to evaluate the lower extremity deep venous systems from the level of the common femoral vein and including the common femoral, femoral, profunda femoral, popliteal and calf veins including the posterior tibial, peroneal and gastrocnemius veins when visible. The superficial great saphenous vein was also interrogated. Spectral Doppler was utilized to evaluate flow at rest and with distal augmentation maneuvers in the common femoral, femoral and popliteal veins.  COMPARISON:  None Available. FINDINGS: RIGHT LOWER EXTREMITY Common Femoral Vein: No evidence of thrombus. Normal compressibility, respiratory phasicity and response to augmentation. Saphenofemoral Junction: No evidence of thrombus. Normal compressibility and flow on color Doppler imaging. Profunda Femoral Vein: No evidence of thrombus. Normal compressibility and flow on color Doppler imaging. Femoral Vein: No evidence of thrombus. Normal compressibility, respiratory phasicity and response to augmentation. Popliteal Vein: No evidence of thrombus. Normal compressibility, respiratory phasicity and response to augmentation. Calf Veins: No evidence of thrombus. Normal compressibility and flow on color Doppler imaging. Superficial Great Saphenous Vein: No evidence of thrombus. Normal compressibility. Venous Reflux:  None. Other Findings:  None. LEFT LOWER EXTREMITY Common Femoral Vein: No evidence of thrombus. Normal compressibility, respiratory phasicity and response to augmentation. Saphenofemoral Junction: No evidence of thrombus. Normal compressibility and flow on color Doppler imaging. Profunda Femoral Vein: No evidence of thrombus. Normal compressibility and flow on color Doppler imaging. Femoral Vein: No evidence of thrombus. Normal compressibility, respiratory phasicity and response to augmentation. Popliteal Vein: No evidence of thrombus. Normal compressibility, respiratory phasicity and response to augmentation. Calf Veins: No evidence of thrombus. Normal compressibility and flow on  color Doppler imaging. Superficial Great Saphenous Vein: No evidence of thrombus. Normal compressibility. Venous Reflux:  None. Other Findings:  None. IMPRESSION: No evidence of deep venous thrombosis in either lower extremity. Electronically Signed   By: Malachy Moan M.D.   On: 11/30/2022 10:29   PERIPHERAL VASCULAR CATHETERIZATION  Result Date: 11/29/2022 See surgical note for result.   Scheduled Meds:  acetaminophen   650 mg Oral TID   ammonium lactate  1 Application Topical Daily   apixaban  10 mg Oral BID   Followed by   Melene Muller ON 12/07/2022] apixaban  5 mg Oral BID   aspirin EC  81 mg Oral Daily   atorvastatin  20 mg Oral Daily   budesonide  0.5 mg Inhalation Daily   calcium carbonate  1,000 mg Oral BID WC   cholecalciferol  2,000 Units Oral Daily   feeding supplement  237 mL Oral TID BM   latanoprost  1 drop Both Eyes QHS   magnesium oxide  200 mg Oral Daily   mirtazapine  15 mg Oral QHS   potassium chloride SA  20 mEq Oral Daily   senna  1 tablet Oral BID   sodium chloride flush  3 mL Intravenous Q12H   umeclidinium bromide  1 puff Inhalation Daily   Continuous Infusions:  sodium chloride     PRN Meds: sodium chloride, acetaminophen **AND** acetaminophen, albuterol, ondansetron **OR** ondansetron (ZOFRAN) IV, sodium chloride flush, traZODone  Time spent: 35 minutes  Author: Gillis Santa. MD [facility] 11/30/2022 3:53 PM  To reach On-call, see care teams to locate the attending and reach out to them via [email or web address]. If 7PM-7AM, please contact night-coverage If you still have difficulty reaching the attending provider, please page the [facility] (Director on Call) for [facility] on amion for assistance.

## 2022-11-30 NOTE — Progress Notes (Signed)
  Progress Note    11/30/2022 9:36 AM 1 Day Post-Op  Subjective: Nancy Berg is a 77 year old female who presented to [facility] emergency department with shortness of breath.  Upon workup she was found to have pulmonary embolism.  She is now postop day 1 from a mechanical thrombectomy of her left main and left lower lobe pulmonary arteries.  On exam this morning patient is resting comfortably in bed and breathing better.  She endorses not having to work as hard to breathe.  She normally is on 2 L nasal [place] oxygen at home and appears to be back at her baseline.  He denies any chest pain or recurrent shortness of breath.  She denies any dizziness blurred vision.  Complaints overnight.  Vitals all remained stable.   Vitals:   11/30/22 0420 11/30/22 0808  BP: 109/62 121/64  Pulse: 89 89  Resp: 18 19  Temp: (!) 97.4 F (36.3 C) 97.7 F (36.5 C)  SpO2: 100% 97%   Physical Exam: Cardiac:  RRR, without  Murmurs, rubs or gallops; without carotid bruits  Lungs:  normal non-labored breathing, without Rales, rhonchi, wheezing  Incisions:  Right Groin, Dressing clean dry and intact.  Extremities:  Palpable pulses throughout Abdomen:  Positive bowel sounds, soft, NT/ND, no masses  Neurologic: A&O X 3;  No focal weakness or paresthesias are detected; speech is fluent/normal Psychiatric:  The pt has Abnormal- Flat  affect. Per Shara Blazing who is POA patient has moderate dementia.  CBC    Component Value Date/Time   WBC 12.6 (H) 11/30/2022 0055   RBC 3.68 (L) 11/30/2022 0055   HGB 11.5 (L) 11/30/2022 0055   HCT 36.7 11/30/2022 0055   PLT 214 11/30/2022 0055   MCV 99.7 11/30/2022 0055   MCH 31.3 11/30/2022 0055   MCHC 31.3 11/30/2022 0055   RDW 15.4 11/30/2022 0055   LYMPHSABS 1.1 08/30/2022 1304   MONOABS 1.0 08/30/2022 1304   EOSABS 0.3 08/30/2022 1304   BASOSABS 0.1 08/30/2022 1304    BMET    Component Value Date/Time   NA 141 11/30/2022 0055   K 3.6 11/30/2022 0055   CL 102  11/30/2022 0055   CO2 32 11/30/2022 0055   GLUCOSE 109 (H) 11/30/2022 0055   BUN 24 (H) 11/30/2022 0055   CREATININE 0.55 11/30/2022 0055   CALCIUM 8.7 (L) 11/30/2022 0055   GFRNONAA >60 11/30/2022 0055    INR    Component Value Date/Time   INR 1.0 11/28/2022 2344     Intake/Output Summary (Last 24 hours) at 11/30/2022 0936 Last data filed at 11/30/2022 1610 Gross per 24 hour  Intake 451.56 ml  Output 200 ml  Net 251.56 ml     Assessment/Plan:  77 y.o. female is s/p radical thrombectomy for left main and left lower lobe pulmonary arteries.  1 Day Post-Op   PLAN: Stop heparin infusion today. Start Eliquis 10 mg p.o. twice daily x 7 days then convert to 5 mg p.o. twice daily PT/OT eval Per vascular surgery patient is okay for discharge once converted to p.o. Eliquis.  DVT prophylaxis:  Heparin Infusion conversion to [facility] 11/30/2022 9:36 AM

## 2022-11-30 NOTE — Progress Notes (Addendum)
ANTICOAGULATION CONSULT NOTE  Pharmacy Consult for heparin to apixaban transition Indication: pulmonary embolus  Allergies  Allergen Reactions   Penicillins Rash and Other (See Comments)    Patient Measurements: Height: 5\' 1"  (154.9 cm) Weight: 38.9 kg (85 lb 12.1 oz) IBW/kg (Calculated) : 47.8 Heparin Dosing Weight: 38.6 kg  Vital Signs: Temp: 97.7 F (36.5 C) (05/01 0808) Temp Source: Oral (05/01 0420) BP: 121/64 (05/01 0808) Pulse Rate: 89 (05/01 0808)  Labs: Recent Labs    11/28/22 1451 11/28/22 2344 11/29/22 0829 11/30/22 0055 11/30/22 0845  HGB 13.2  --  12.4 11.5*  --   HCT 42.6  --  39.6 36.7  --   PLT 245  --  213 214  --   APTT  --  28  --   --   --   LABPROT  --  12.7  --   --   --   INR  --  1.0  --   --   --   HEPARINUNFRC  --   --  0.29* 0.36 0.35  CREATININE 0.77  --   --  0.55  --      Estimated Creatinine Clearance: 36.2 mL/min (by C-G formula based on SCr of 0.55 mg/dL).   Medical History: Past Medical History:  Diagnosis Date   Arthritis    Cancer (HCC)    CHF (congestive heart failure) (HCC)    COPD (chronic obstructive pulmonary disease) (HCC)    Dementia (HCC)    Hyperlipidemia     Assessment: Pt is a 77 yo female presenting to ED d/t low SpO2 levels, found with "pulmonary embolism in the left main pulmonary artery extending into the left lower lobar branch."   S/p thrombectomy. No prior anticoagulation noted  Goal of Therapy:  Heparin level 0.3-0.7 units/ml Monitor platelets by anticoagulation protocol: Yes   Plan: transitioning IV heparin to Eliquis (apixaban) Discontinue IV heparin and give Eliquis 10 mg. Eliquis 10mg  BID x 7 days followed by 5 mg BID thereafter  Elliot Gurney, PharmD, BCPS Clinical Pharmacist  11/30/2022 10:07 AM'

## 2022-11-30 NOTE — Evaluation (Addendum)
Occupational Therapy Evaluation Patient Details Name: Nancy Berg MRN: [id number] DOB: Jun 17, 1946 Today's Date: 11/30/2022   History of Present Illness Nancy Berg is a 77 y.o. 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  Had another episode where she was hypoxic at rest today and was sent to the ER for further evaluation.   Clinical Impression   Patient agreeable to OT evaluation. Son present. Pt presenting with decreased independence in self care, balance, functional mobility/transfers, and endurance. PTA pt reports being Mod I for ADLs, receiving assistance for IADLs, and using a RW for functional mobility. Pt on 2L O2 at baseline. Pt currently functioning at Min A for bed mobility, Min A for BSC transfer, set up-supervision for peri care in sitting, and Min A for clothing management in standing. Pt will benefit from skilled acute OT services to address deficits noted below. OT recommends ongoing therapy upon discharge to maximize safety and independence with ADLs, decrease fall risk, decrease caregiver burden, and promote return to PLOF.        Recommendations for follow up therapy are one component of a multi-disciplinary discharge planning process, led by the attending physician.  Recommendations may be updated based on patient status, additional functional criteria and insurance authorization.   Assistance Recommended at Discharge Frequent or constant Supervision/Assistance  Patient can return home with the following Assistance with cooking/housework;Assist for transportation;Help with stairs or ramp for entrance;Direct supervision/assist for medications management;Direct supervision/assist for financial management;A little help with  bathing/dressing/bathroom;A little help with walking and/or transfers    Functional Status Assessment  Patient has had a recent decline in their functional status and demonstrates the ability to make significant improvements in function in a reasonable and predictable amount of time.  Equipment Recommendations  Other (comment): defer to next venue of care   Recommendations for Other Services       Precautions / Restrictions Precautions Precautions: Fall Restrictions Weight Bearing Restrictions: No      Mobility Bed Mobility Overal bed mobility: Needs Assistance Bed Mobility: Supine to Sit, Sit to Supine     Supine to sit: Min assist (assist for trunk elevation) Sit to supine: Min assist (assist for BLE managment)   General bed mobility comments: Min A to scoot hips forward at EOB    Transfers Overall transfer level: Needs assistance Equipment used: 1 person hand held assist Transfers: Sit to/from Stand, Bed to chair/wheelchair/BSC Sit to Stand: Min assist     Step pivot transfers: Min assist            Balance Overall balance assessment: Needs assistance Sitting-balance support: Feet supported Sitting balance-Leahy Scale: Good     Standing balance support: Single extremity supported, During functional activity Standing balance-Leahy Scale: Fair         ADL either performed or assessed with clinical judgement   ADL Overall ADL's : Needs assistance/impaired       Toilet Transfer: Minimal assistance;BSC/3in1 Statistician Details (indicate cue type and reason): via HHA Toileting- Clothing Manipulation and Hygiene: Sitting/lateral lean;Sit to/from stand Toileting - Clothing Manipulation Details (indicate cue type and reason): set up-supervision for peri care after continent void on toilet, Min A for clothing management of gown in standing  Vision Baseline Vision/History: 1 Wears glasses (readers only) Patient Visual Report: No change  from baseline       Perception     Praxis      Pertinent Vitals/Pain Pain Assessment Pain Assessment: No/denies pain     Hand Dominance     Extremity/Trunk Assessment Upper Extremity Assessment Upper Extremity Assessment: Generalized weakness   Lower Extremity Assessment Lower Extremity Assessment: Generalized weakness   Cervical / Trunk Assessment Cervical / Trunk Assessment: Kyphotic   Communication Communication Communication: No difficulties   Cognition Arousal/Alertness: Awake/alert Behavior During Therapy: WFL for tasks assessed/performed Overall Cognitive Status: History of cognitive impairments - at baseline         General Comments: Per chart review, pt with h/o dementia at baseline. Pleasant & cooperative, followed commands wells, able to provide history with son filling in small details     General Comments  Pt received on 2L O2 via [place], SpO2 85% at rest. O2 titrated to 4L for activity and improved to 93%. SpO2 after activity 87% (likely due to poor pleth). VC required for PLB.    Exercises Other Exercises Other Exercises: OT provided education re: role of OT, OT POC, post acute recs, sitting up for all meals, EOB/OOB mobility with assistance, home/fall safety, energy conservation techniques (PLB, rest breaks)   Shoulder Instructions      Home Living Family/patient expects to be discharged to:: Assisted living ([facility] in [place])         Home Equipment: Agricultural consultant (2 wheels);Rollator (4 wheels);Wheelchair - manual;Shower seat;Grab bars - tub/shower   Additional Comments: 2L O2 at baseline      Prior Functioning/Environment Prior Level of Function : Needs assist       Physical Assist : ADLs (physical)   ADLs (physical): IADLs Mobility Comments: Mod I using RW for household distances, son reports pt can normally walk to dining hall/bathroom/living room but that she "fatigues quickly", uses w/c at appointments, denies history of  falls ADLs Comments: Pt reports Mod I for ADLs, facility provides assistance for med mgmt and meals. Son provides transportation to medical appointments.        OT Problem List: Decreased strength;Decreased range of motion;Decreased activity tolerance;Impaired balance (sitting and/or standing);Decreased cognition;Cardiopulmonary status limiting activity;Decreased safety awareness      OT Treatment/Interventions: Self-care/ADL training;Therapeutic exercise;Energy conservation;DME and/or AE instruction;Therapeutic activities;Cognitive remediation/compensation;Patient/family education;Balance training    OT Goals(Current goals can be found in the care plan section) Acute Rehab OT Goals Patient Stated Goal: return to ALF OT Goal Formulation: With patient/family Time For Goal Achievement: 12/14/22 Potential to Achieve Goals: Fair   OT Frequency: Min 3X/week    Co-evaluation              AM-PAC OT "6 Clicks" Daily Activity     Outcome Measure Help from another person eating meals?: A Little Help from another person taking care of personal grooming?: A Little Help from another person toileting, which includes using toliet, bedpan, or urinal?: A Little Help from another person bathing (including washing, rinsing, drying)?: A Lot Help from another person to put on and taking off regular upper body clothing?: A Little Help from another person to put on and taking off regular lower body clothing?: A Lot 6 Click Score: 16   End of Session Equipment Utilized During Treatment: Gait belt Nurse Communication: Mobility status;Other (comment) (O2)  Activity Tolerance: Patient limited by fatigue;Other (comment) (SOB) Patient left: in bed;with call bell/phone within reach;with bed alarm set;with family/visitor present  OT Visit Diagnosis: Other abnormalities of gait and mobility (R26.89);Muscle weakness (generalized) (M62.81)                Time: 6045-4098 OT Time Calculation (min): 21  min Charges:  OT General Charges $OT Visit: 1 Visit OT Evaluation $OT Eval Low Complexity: 1 Low  [facility] MS, OTR/L ascom [phone]  11/30/22, 2:47 PM

## 2022-11-30 NOTE — Progress Notes (Signed)
ANTICOAGULATION CONSULT NOTE  Pharmacy Consult for heparin infusion Indication: pulmonary embolus  Allergies  Allergen Reactions   Penicillins Rash and Other (See Comments)    Patient Measurements: Height: 5\' 1"  (154.9 cm) Weight: 38.9 kg (85 lb 12.1 oz) IBW/kg (Calculated) : 47.8 Heparin Dosing Weight: 38.6 kg  Vital Signs: Temp: 97.8 F (36.6 C) (04/30 2053) Temp Source: Oral (04/30 2053) BP: 102/63 (04/30 2053) Pulse Rate: 79 (04/30 2053)  Labs: Recent Labs    11/28/22 1451 11/28/22 2344 11/29/22 0829 11/30/22 0055  HGB 13.2  --  12.4 11.5*  HCT 42.6  --  39.6 36.7  PLT 245  --  213 214  APTT  --  28  --   --   LABPROT  --  12.7  --   --   INR  --  1.0  --   --   HEPARINUNFRC  --   --  0.29* 0.36  CREATININE 0.77  --   --  0.55     Estimated Creatinine Clearance: 36.2 mL/min (by C-G formula based on SCr of 0.55 mg/dL).   Medical History: Past Medical History:  Diagnosis Date   Arthritis    Cancer (HCC)    CHF (congestive heart failure) (HCC)    COPD (chronic obstructive pulmonary disease) (HCC)    Dementia (HCC)    Hyperlipidemia     Assessment: Pt is a 77 yo female presenting to ED d/t low SpO2 levels, found with "pulmonary embolism in the left main pulmonary artery extending into the left lower lobar branch."  Goal of Therapy:  Heparin level 0.3-0.7 units/ml Monitor platelets by anticoagulation protocol: Yes   Plan: heparin level 0.36, therapeutic x 1 Continue heparin infusion at 650 units/hr Will recheck HL in 8 hr to confirm then daily CBC daily while on heparin  Otelia Sergeant, PharmD, [facility] 11/30/2022 1:41 AM

## 2022-11-30 NOTE — Evaluation (Addendum)
Physical Therapy Evaluation Patient Details Name: Nancy Berg MRN: [id number] DOB: Jan 02, 1946 Today's Date: 11/30/2022  History of Present Illness  Nancy Berg is a 77 y.o. 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  Had another episode where she was hypoxic at rest today and was sent to the ER for further evaluation.   Clinical Impression  Patient A&Ox4 agreeable to PT, denied pain. At baseline pt stated she lives at ALF with a roommate, normally modI for ADLs and ambulation, including to dining hall, and is on 2L of O2 via [place]. She was able to perform bed mobility with CGA, extended time, somewhat effortful to complete. Sit <> stand several times with RW and CGA, and pt able to perform pericare in sitting on BSC. She ambulated ~34ft with RW and CGA. Exhibited decreased step length/height and almost step to pattern. Pt fatigued very quickly and complained of SOB, spO2 88%-92% throughout session.  Overall the patient demonstrated deficits (see "PT Problem List") that impede the patient's functional abilities, safety, and mobility and would benefit from skilled PT intervention. Recommendation is to continue skilled PT intervention to maximize function and safety.        Recommendations for follow up therapy are one component of a multi-disciplinary discharge planning process, led by the attending physician.  Recommendations may be updated based on patient status, additional functional criteria and insurance authorization.  Follow Up Recommendations Can patient physically be transported by private vehicle: Yes     Assistance Recommended at Discharge Intermittent Supervision/Assistance  Patient can return home with the following  A little help  with walking and/or transfers;Assistance with cooking/housework;Assist for transportation;Direct supervision/assist for medications management;Help with stairs or ramp for entrance;Direct supervision/assist for financial management;A little help with bathing/dressing/bathroom    Equipment Recommendations None recommended by PT  Recommendations for Other Services       Functional Status Assessment Patient has had a recent decline in their functional status and demonstrates the ability to make significant improvements in function in a reasonable and predictable amount of time.     Precautions / Restrictions Precautions Precautions: Fall Restrictions Weight Bearing Restrictions: No      Mobility  Bed Mobility Overal bed mobility: Needs Assistance Bed Mobility: Supine to Sit, Sit to Supine     Supine to sit: Min guard Sit to supine: Min guard        Transfers Overall transfer level: Needs assistance Equipment used: 1 person hand held assist, Rolling walker (2 wheels) Transfers: Sit to/from Stand, Bed to chair/wheelchair/BSC     Step pivot transfers: Min guard            Ambulation/Gait Ambulation/Gait assistance: Min guard Gait Distance (Feet): 17 Feet Assistive device: Rolling walker (2 wheels)         General Gait Details: very slow step through gait pattern, pt fatigued quickly  Stairs            Wheelchair Mobility    Modified Rankin (Stroke Patients Only)       Balance Overall balance assessment: Needs assistance Sitting-balance support: Feet supported Sitting balance-Leahy Scale: Good Sitting balance - Comments: performed pericare in sitting   Standing balance support: Single extremity supported, During functional activity Standing balance-Leahy Scale: Fair  Pertinent Vitals/Pain Pain Assessment Pain Assessment: No/denies pain    Home Living Family/patient expects to be discharged to::  Assisted living ([facility] in [place])                 Home Equipment: Agricultural consultant (2 wheels);Rollator (4 wheels);Wheelchair - manual;Shower seat;Grab bars - tub/shower Additional Comments: 2L O2 at baseline    Prior Function Prior Level of Function : Needs assist       Physical Assist : ADLs (physical)   ADLs (physical): IADLs Mobility Comments: Mod I using RW for household distances, son reports pt can normally walk to dining hall/bathroom/living room but that she "fatigues quickly", uses w/c at appointments, denies history of falls ADLs Comments: Pt reports IND for ADLs, facility provides assistance for med mgmt and meals. Son provides transportation to medical appointments.     Hand Dominance        Extremity/Trunk Assessment   Upper Extremity Assessment Upper Extremity Assessment: Generalized weakness    Lower Extremity Assessment Lower Extremity Assessment: Generalized weakness    Cervical / Trunk Assessment Cervical / Trunk Assessment: Kyphotic  Communication   Communication: No difficulties  Cognition Arousal/Alertness: Awake/alert Behavior During Therapy: WFL for tasks assessed/performed Overall Cognitive Status: History of cognitive impairments - at baseline                                 General Comments: Per chart review, pt with h/o dementia at baseline. Pleasant & cooperative, followed commands wells, able to provide history with son filling in small details        General Comments General comments (skin integrity, edema, etc.): Pt received on 2L O2 via [place], SpO2 85% at rest. O2 titrated to 4L for activity and improved to 93%. SpO2 after activity 87% (likely due to poor pleth). VC required for PLB.    Exercises     Assessment/Plan    PT Assessment Patient needs continued PT services  PT Problem List Decreased strength;Decreased mobility;Decreased range of motion;Decreased activity tolerance;Decreased balance;Decreased  knowledge of use of DME;Decreased knowledge of precautions       PT Treatment Interventions Therapeutic activities;DME instruction;Gait training;Therapeutic exercise;Patient/family education;Stair training;Balance training;Functional mobility training;Neuromuscular re-education    PT Goals (Current goals can be found in the Care Plan section)  Acute Rehab PT Goals Patient Stated Goal: to get stronger PT Goal Formulation: With patient Time For Goal Achievement: 12/14/22 Potential to Achieve Goals: Good    Frequency Min 4X/week     Co-evaluation               AM-PAC PT "6 Clicks" Mobility  Outcome Measure Help needed turning from your back to your side while in a flat bed without using bedrails?: A Little Help needed moving from lying on your back to sitting on the side of a flat bed without using bedrails?: A Little Help needed moving to and from a bed to a chair (including a wheelchair)?: A Little Help needed standing up from a chair using your arms (e.g., wheelchair or bedside chair)?: A Little Help needed to walk in hospital room?: A Little Help needed climbing 3-5 steps with a railing? : A Lot 6 Click Score: 17    End of Session Equipment Utilized During Treatment: Gait belt;Oxygen (4L) Activity Tolerance: Patient tolerated treatment well Patient left: in bed;with call bell/phone within reach;with bed alarm set Nurse Communication: Mobility status PT Visit Diagnosis: Other abnormalities of  gait and mobility (R26.89);Muscle weakness (generalized) (M62.81);Difficulty in walking, not elsewhere classified (R26.2)    Time: 1610-9604 PT Time Calculation (min) (ACUTE ONLY): 21 min   Charges:   PT Evaluation $PT Eval Low Complexity: 1 Low PT Treatments $Therapeutic Activity: 8-22 mins        Olga Coaster PT, DPT 2:53 PM,11/30/22

## 2022-11-30 NOTE — TOC Benefit Eligibility Note (Signed)
Patient Product/process development scientist completed.    The patient is currently admitted and upon discharge could be taking Eliquis Starter pack.  The current 30 day co-pay is $0.00.   The patient is insured through [employer] Part D   This test claim was processed through [facility]- copay amounts may vary at other pharmacies due to pharmacy/plan contracts, or as the patient moves through the different stages of their insurance plan.  Roland Earl, CPHT Pharmacy Patient Advocate Specialist [facility] Patient Advocate Team Direct Number: [phone]  Fax: [phone]

## 2022-11-30 NOTE — Progress Notes (Signed)
OT Cancellation Note  Patient Details Name: Issis Lindseth MRN: [id number] DOB: 07/17/1946   Cancelled Treatment:    Reason Eval/Treat Not Completed: Patient at procedure or test/ unavailable. OT orders received, chart reviewed. Pt currently off the floor at ultrasound. Will re-attempt OT evaluation as able.   Dorene Grebe  [facility] 11/30/2022, 9:27 AM

## 2022-12-01 DIAGNOSIS — I2609 Other pulmonary embolism with acute cor pulmonale: Secondary | ICD-10-CM | POA: Diagnosis not present

## 2022-12-01 LAB — CBC
HCT: 37.6 % (ref 36.0–46.0)
Hemoglobin: 11.7 g/dL — ABNORMAL LOW (ref 12.0–15.0)
MCH: 31.4 pg (ref 26.0–34.0)
MCHC: 31.1 g/dL (ref 30.0–36.0)
MCV: 100.8 fL — ABNORMAL HIGH (ref 80.0–100.0)
Platelets: 211 10*3/uL (ref 150–400)
RBC: 3.73 MIL/uL — ABNORMAL LOW (ref 3.87–5.11)
RDW: 15.3 % (ref 11.5–15.5)
WBC: 11.7 10*3/uL — ABNORMAL HIGH (ref 4.0–10.5)
nRBC: 0 % (ref 0.0–0.2)

## 2022-12-01 LAB — ECHOCARDIOGRAM COMPLETE
Area-P 1/2: 3.95 cm2
S' Lateral: 1.6 cm
Weight: 1372.14 oz

## 2022-12-01 LAB — BASIC METABOLIC PANEL
Anion gap: 7 (ref 5–15)
BUN: 23 mg/dL (ref 8–23)
CO2: 32 mmol/L (ref 22–32)
Calcium: 8.8 mg/dL — ABNORMAL LOW (ref 8.9–10.3)
Chloride: 101 mmol/L (ref 98–111)
Creatinine, Ser: 0.35 mg/dL — ABNORMAL LOW (ref 0.44–1.00)
GFR, Estimated: >60 mL/min (ref >60)
Glucose, Bld: 92 mg/dL (ref 70–99)
Potassium: 4 mmol/L (ref 3.5–5.1)
Sodium: 140 mmol/L (ref 135–145)

## 2022-12-01 LAB — MAGNESIUM: Magnesium: 1.7 mg/dL (ref 1.7–2.4)

## 2022-12-01 LAB — BRAIN NATRIURETIC PEPTIDE: B Natriuretic Peptide: 115.7 pg/mL — ABNORMAL HIGH (ref 0.0–100.0)

## 2022-12-01 LAB — PHOSPHORUS: Phosphorus: 3.8 mg/dL (ref 2.5–4.6)

## 2022-12-01 MED ORDER — FUROSEMIDE 10 MG/ML IJ SOLN
20.0000 mg | Freq: Two times a day (BID) | INTRAMUSCULAR | Status: AC
  Administered 2022-12-01: 20 mg via INTRAVENOUS
  Filled 2022-12-01 (×2): qty 2

## 2022-12-01 MED ORDER — MIDODRINE HCL 5 MG PO TABS
5.0000 mg | ORAL_TABLET | Freq: Once | ORAL | Status: AC
  Administered 2022-12-01: 5 mg via ORAL
  Filled 2022-12-01: qty 1

## 2022-12-01 MED ORDER — MIDODRINE HCL 5 MG PO TABS
10.0000 mg | ORAL_TABLET | Freq: Three times a day (TID) | ORAL | Status: DC
  Administered 2022-12-02 – 2022-12-04 (×9): 10 mg via ORAL
  Filled 2022-12-01 (×10): qty 2

## 2022-12-01 MED ORDER — MIDODRINE HCL 5 MG PO TABS
5.0000 mg | ORAL_TABLET | Freq: Three times a day (TID) | ORAL | Status: DC
  Administered 2022-12-01: 5 mg via ORAL
  Filled 2022-12-01: qty 1

## 2022-12-01 MED ORDER — BISACODYL 10 MG RE SUPP: 10.0000 mg | Freq: Every day | RECTAL | Status: DC | PRN

## 2022-12-01 MED ORDER — MIDODRINE HCL 5 MG PO TABS: 5.0000 mg | ORAL_TABLET | Freq: Three times a day (TID) | ORAL | Status: DC

## 2022-12-01 MED ORDER — MINERAL OIL RE ENEM: 1.0000 | ENEMA | Freq: Once | RECTAL | Status: DC

## 2022-12-01 MED ORDER — BISACODYL 5 MG PO TBEC
10.0000 mg | DELAYED_RELEASE_TABLET | Freq: Once | ORAL | Status: AC
  Administered 2022-12-01: 10 mg via ORAL
  Filled 2022-12-01: qty 2

## 2022-12-01 MED ORDER — POLYETHYLENE GLYCOL 3350 17 G PO PACK
17.0000 g | PACK | Freq: Two times a day (BID) | ORAL | Status: DC
  Administered 2022-12-03 – 2022-12-04 (×2): 17 g via ORAL
  Filled 2022-12-01 (×6): qty 1

## 2022-12-01 MED ORDER — BISACODYL 5 MG PO TBEC: 10.0000 mg | DELAYED_RELEASE_TABLET | Freq: Every day | ORAL | Status: DC | PRN

## 2022-12-01 MED ORDER — BISACODYL 10 MG RE SUPP
10.0000 mg | Freq: Every day | RECTAL | Status: AC
  Administered 2022-12-01: 10 mg via RECTAL
  Filled 2022-12-01: qty 1

## 2022-12-01 NOTE — Progress Notes (Signed)
  Progress Note    12/01/2022 11:11 AM 2 Days Post-Op  Subjective:   Nancy Berg is a 77 year old female who presented to [facility] emergency department with shortness of breath.  Upon workup she was found to have pulmonary embolism.  She is now postop day 1 from a mechanical thrombectomy of her left main and left lower lobe pulmonary arteries.   On exam this morning patient is resting comfortably in bed and breathing better.  She endorses not having to work as hard to breathe.  She normally is on 2 L nasal Cannula oxygen at home but she continues to need 4 liters nasal cannula O2   She denies any chest pain or recurrent shortness of breath.  She denies any dizziness blurred vision.  Complaints overnight.  Vitals all remained stable.   Vitals:   12/01/22 0751 12/01/22 0800  BP: (!) 88/76 101/60  Pulse: 99 88  Resp: 18   Temp: 98.2 F (36.8 C)   SpO2: (!) 85% 95%   Physical Exam: Cardiac:  RRR, without  Murmurs, rubs or gallops; without carotid bruits  Lungs:   normal non-labored breathing, without Rales, rhonchi, wheezing  Incisions:  Right Groin, Dressing clean dry and intact.  Extremities:  Palpable pulses throughout  Abdomen:   Positive bowel sounds, soft, NT/ND, no masses  Neurologic: A&O X 3;  No focal weakness or paresthesias are detected; speech is fluent/normal Psychiatric:  The pt has Abnormal- Flat  affect. Per Nancy Berg who is POA patient has moderate dementia  CBC    Component Value Date/Time   WBC 11.7 (H) 12/01/2022 0500   RBC 3.73 (L) 12/01/2022 0500   HGB 11.7 (L) 12/01/2022 0500   HCT 37.6 12/01/2022 0500   PLT 211 12/01/2022 0500   MCV 100.8 (H) 12/01/2022 0500   MCH 31.4 12/01/2022 0500   MCHC 31.1 12/01/2022 0500   RDW 15.3 12/01/2022 0500   LYMPHSABS 1.1 08/30/2022 1304   MONOABS 1.0 08/30/2022 1304   EOSABS 0.3 08/30/2022 1304   BASOSABS 0.1 08/30/2022 1304    BMET    Component Value Date/Time   NA 140 12/01/2022 0500   K 4.0 12/01/2022 0500    CL 101 12/01/2022 0500   CO2 32 12/01/2022 0500   GLUCOSE 92 12/01/2022 0500   BUN 23 12/01/2022 0500   CREATININE 0.35 (L) 12/01/2022 0500   CALCIUM 8.8 (L) 12/01/2022 0500   GFRNONAA >60 12/01/2022 0500    INR    Component Value Date/Time   INR 1.0 11/28/2022 2344     Intake/Output Summary (Last 24 hours) at 12/01/2022 1111 Last data filed at 12/01/2022 1010 Gross per 24 hour  Intake 480 ml  Output --  Net 480 ml     Assessment/Plan:  77 y.o. female is s/p  radical thrombectomy for left main and left lower lobe pulmonary arteries  2 Days Post-Op   Plan: Per vascular surgery patient is okay for discharge once converted to p.o. Eliquis and returns to her baseline Oxygen requirement. Continue PT/OT evaluation.  DVT prophylaxis:  Eliquis 10 mg X 7 Days then convert to 5 mg Twice Daily   N[facility] 12/01/2022 11:11 AM

## 2022-12-01 NOTE — Progress Notes (Signed)
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 She is continue on immunologic therapy for her breast cancer. At the long-term care facility where she resides she was noted to have increased hypoxemia with O2 sats in the 70-80's. Due to persistent hypoxemia she was transported to [facility]ED for evaluation.     ED Course: Afebrile, 119/58  82  RR 18. Chroncally ill, emaciated woman presenting SOB with hypoxemia  on 2L [place] oxygen. EDP exam unremarkable. Lab: VBG 7.45/55/<31, Glucose 111, lactic acid 1.2  WBC 11.8, Hgb 13.2, Plt 245. CXR increased basilar markings c/w atelectasis vs PNA. CTA - PE Left main pulmnary artery extending to left lower lobe with evidence of right heart strain; severe emphysematous changes. In ED patient started on IV heparin. TRH called to admit for continued management.   Assessment and Plan:  #Acute pulmonary embolism with right heart strain CTA reviewed as above S/p heparin IV infusion, 5/1 started Eliquis 10 mg p.o. twice daily for 7 days followed by 5 mg p.o. twice daily Continue supplemental O2 inhalation and gradually wean down to 2L baseline TTE LVEF 60 to 65%, grade 1 diastolic dysfunction, moderate pulmonary hypertension Venous duplex lower extremity negative for DVT Vascular surgery consulted, s/p successful thrombectomy was done on 4/30, patient tolerated procedure well.     Acute on chronic hypoxic respiratory failure Patient is on oxygen 2 L at baseline Patient is desatting with little movement BNP 115 slightly elevated Lasix 20 mg IV BID x 2 doses ordered  F/u TTE    Hypotension H/o  hypertension, held metoprolol for now On 5/2, noticed low blood pressure, started midodrine 10 mg p.o. 3 times daily Monitor BP and titrate medications accordingly  HLD, Chronic diastolic CHF BNP 638 slightly elevated Lasix as above F/u TTE and chest x-ray tomorrow a.m. Continue to monitor  PVD, continue aspirin and statin  COPD, no exacerbation noticed Patient follows Dr. Meredeth Ide pulmonologist for severe emphysema, last OV 10/14/2022-goal discharge 3-4 stable on current regimen and 2 L oxygen Continue Pulmicort inhaler daily, Incruse Ellipta inhaler daily and albuterol nebulizer as needed  Breast cancer  Patient s/p mastectomy, adjuvant XRT and a course of carbo-taxol chemotherapy with addition of Keytruda. F/u out pt as Per oncology   Severe protein calorie malnutrition  Patient very thin - appears emaciated. Plan     Total protein and prealbumin RD consult   Body mass index is 16.2 kg/m.  Interventions:     Diet: Regular diet DVT Prophylaxis: Therapeutic Anticoagulation with Eliquis    Advance goals of care discussion: Full code  Family Communication: family was present at bedside, at the time of interview.  The pt provided permission to discuss medical plan with the family. Opportunity was given to ask question and all questions were answered satisfactorily.   Disposition:  Pt is from ALF, admitted with pulmonary embolism with right heart strain, vascular surgery consulted s/p thrombectomy done on 4/30, s/p heparin IV infusion, PT and OT eval done, may need SNF placement, TOC is working on discharge planning. Patient is still very hypoxic and hypotensive so we may have to monitor her  for 1-2 more days.   Subjective: No significant events overnight, patient denies any chest pain or palpitations, patient is still very short of breath and O2 sats drop significantly on lateral movement.  Patient's blood pressure is low but she is asymptomatic.  We will continue to monitor  today and plan for disposition tomorrow a.m. if remains stable.   Physical Exam: General: NAD, lying comfortably, mild SOB Appear in no distress, affect appropriate Eyes: PERRLA ENT: Oral Mucosa Clear, moist  Neck: no JVD,  Cardiovascular: S1 and S2 Present, no Murmur,  Respiratory: good respiratory effort, Bilateral Air entry equal, mild bibasilar crackles, no wheezing  Abdomen: Bowel Sound present, Soft and no tenderness,  Skin: no rashes Extremities: no Pedal edema, no calf tenderness Neurologic: without any new focal findings Gait not checked due to patient safety concerns  Vitals:   12/01/22 0741 12/01/22 0751 12/01/22 0800 12/01/22 1200  BP:  (!) 88/76 101/60 90/60  Pulse:  99 88 86  Resp:  18    Temp:  98.2 F (36.8 C)    TempSrc:      SpO2: 91% (!) 85% 95%   Weight:      Height:        Intake/Output Summary (Last 24 hours) at 12/01/2022 1408 Last data filed at 12/01/2022 1010 Gross per 24 hour  Intake 480 ml  Output --  Net 480 ml   Filed Weights   11/28/22 2300 11/29/22 0410  Weight: 38.6 kg 38.9 kg    Data Reviewed: I have personally reviewed and interpreted daily labs, tele strips, imagings as discussed above. I reviewed all nursing notes, pharmacy notes, vitals, pertinent old records I have discussed plan of care as described above with RN and patient/family.  CBC: Recent Labs  Lab 11/28/22 1451 11/29/22 0829 11/30/22 0055 12/01/22 0500  WBC 11.8* 10.6* 12.6* 11.7*  HGB 13.2 12.4 11.5* 11.7*  HCT 42.6 39.6 36.7 37.6  MCV 100.0 99.0 99.7 100.8*  PLT 245 213 214 211   Basic Metabolic Panel: Recent Labs  Lab 11/28/22 1451 11/30/22 0055 12/01/22 0500  NA 140 141 140  K 3.9 3.6 4.0  CL 100 102 101  CO2 31 32 32  GLUCOSE 111* 109* 92  BUN 20 24* 23  CREATININE 0.77 0.55 0.35*  CALCIUM 9.6 8.7* 8.8*  MG  --  1.7 1.7  PHOS  --  3.9 3.8    Studies: ECHOCARDIOGRAM COMPLETE  Result Date: 12/01/2022    ECHOCARDIOGRAM REPORT   Patient Name:    Nancy Berg Date of Exam: 11/30/2022 Medical Rec #:  [id number]       Height:       61.0 in Accession #:    [id number]      Weight:       85.8 lb Date of Birth:  03/25/1946       BSA:          1.318 m Patient Age:    77 years        BP:           136/48 mmHg Patient Gender: F               HR:           85 bpm. Exam Location:  [facility] Procedure: 2D Echo, Cardiac Doppler and Color Doppler Indications:     I50.21 Acute Systolic Heart Failure  History:         Patient has no prior history of Echocardiogram examinations.  CHF, COPD; Risk Factors:Dyslipidemia.  Sonographer:     Daphine Deutscher RDCS Referring Phys:  [id number] Gillis Santa Diagnosing Phys: Julien Nordmann MD IMPRESSIONS  1. Left ventricular ejection fraction, by estimation, is 60 to 65%. The left ventricle has normal function. The left ventricle has no regional wall motion abnormalities. Left ventricular diastolic parameters are consistent with Grade I diastolic dysfunction (impaired relaxation).  2. Right ventricular systolic function is mildly reduced. The right ventricular size is mildly enlarged. There is moderately elevated pulmonary artery systolic pressure.  3. The mitral valve is normal in structure. No evidence of mitral valve regurgitation. No evidence of mitral stenosis.  4. The aortic valve is normal in structure. Aortic valve regurgitation is not visualized. Aortic valve sclerosis is present, with no evidence of aortic valve stenosis.  5. The inferior vena cava is normal in size with greater than 50% respiratory variability, suggesting right atrial pressure of 3 mmHg. FINDINGS  Left Ventricle: Left ventricular ejection fraction, by estimation, is 60 to 65%. The left ventricle has normal function. The left ventricle has no regional wall motion abnormalities. The left ventricular internal cavity size was normal in size. There is  no left ventricular hypertrophy. Left ventricular diastolic parameters are consistent with Grade I  diastolic dysfunction (impaired relaxation). Right Ventricle: The right ventricular size is mildly enlarged. No increase in right ventricular wall thickness. Right ventricular systolic function is mildly reduced. There is moderately elevated pulmonary artery systolic pressure. The tricuspid regurgitant velocity is 3.58 m/s, and with an assumed right atrial pressure of 5 mmHg, the estimated right ventricular systolic pressure is 56.3 mmHg. Left Atrium: Left atrial size was normal in size. Right Atrium: Right atrial size was normal in size. Pericardium: There is no evidence of pericardial effusion. Mitral Valve: The mitral valve is normal in structure. No evidence of mitral valve regurgitation. No evidence of mitral valve stenosis. Tricuspid Valve: The tricuspid valve is normal in structure. Tricuspid valve regurgitation is mild . No evidence of tricuspid stenosis. Aortic Valve: The aortic valve is normal in structure. Aortic valve regurgitation is not visualized. Aortic valve sclerosis is present, with no evidence of aortic valve stenosis. Pulmonic Valve: The pulmonic valve was normal in structure. Pulmonic valve regurgitation is not visualized. No evidence of pulmonic stenosis. Aorta: The aortic root is normal in size and structure. Venous: The inferior vena cava is normal in size with greater than 50% respiratory variability, suggesting right atrial pressure of 3 mmHg. IAS/Shunts: No atrial level shunt detected by color flow Doppler.  LEFT VENTRICLE PLAX 2D LVIDd:         2.10 cm Diastology LVIDs:         1.60 cm LV e' medial:    9.57 cm/s LV PW:         0.60 cm LV E/e' medial:  5.1 LV IVS:        0.60 cm LV e' lateral:   12.90 cm/s                        LV E/e' lateral: 3.8  RIGHT VENTRICLE            IVC RV Basal diam:  3.20 cm    IVC diam: 1.10 cm RV S prime:     9.17 cm/s TAPSE (M-mode): 1.4 cm LEFT ATRIUM             Index        RIGHT ATRIUM  Index LA diam:        2.80 cm 2.12 cm/m   RA Area:      8.27 cm LA Vol (A2C):   24.2 ml 18.36 ml/m  RA Volume:   15.00 ml 11.38 ml/m LA Vol (A4C):   16.7 ml 12.67 ml/m LA Biplane Vol: 21.8 ml 16.54 ml/m  AORTIC VALVE LVOT Vmax:   115.33 cm/s LVOT Vmean:  77.300 cm/s LVOT VTI:    0.200 m  AORTA Ao Root diam: 3.10 cm MITRAL VALVE               TRICUSPID VALVE MV Area (PHT): 3.95 cm    TR Peak grad:   51.3 mmHg MV Decel Time: 192 msec    TR Vmax:        358.00 cm/s MV E velocity: 48.50 cm/s MV A velocity: 77.05 cm/s  SHUNTS MV E/A ratio:  0.63        Systemic VTI: 0.20 m Julien Nordmann MD Electronically signed by Julien Nordmann MD Signature Date/Time: 12/01/2022/11:53:16 AM    Final     Scheduled Meds:  acetaminophen  650 mg Oral TID   ammonium lactate  1 Application Topical Daily   apixaban  10 mg Oral BID   Followed by   Melene Muller ON 12/07/2022] apixaban  5 mg Oral BID   aspirin EC  81 mg Oral Daily   atorvastatin  20 mg Oral Daily   budesonide  0.5 mg Inhalation Daily   calcium carbonate  1,000 mg Oral BID WC   cholecalciferol  2,000 Units Oral Daily   feeding supplement  237 mL Oral TID BM   furosemide  20 mg Intravenous Q12H   latanoprost  1 drop Both Eyes QHS   magnesium oxide  200 mg Oral Daily   midodrine  10 mg Oral TID WC   midodrine  5 mg Oral Once   mirtazapine  15 mg Oral QHS   potassium chloride SA  20 mEq Oral Daily   senna  1 tablet Oral BID   sodium chloride flush  3 mL Intravenous Q12H   umeclidinium bromide  1 puff Inhalation Daily   Continuous Infusions:  sodium chloride     PRN Meds: sodium chloride, acetaminophen **AND** acetaminophen, albuterol, ondansetron **OR** ondansetron (ZOFRAN) IV, sodium chloride flush, traZODone  Time spent: 50 minutes  Author: Gillis Santa. MD [facility] 12/01/2022 2:08 PM  To reach On-call, see care teams to locate the attending and reach out to them via [email or web address]. If 7PM-7AM, please contact night-coverage If you still have difficulty reaching the attending provider, please page  the [facility] (Director on Call) for [facility] on amion for assistance.

## 2022-12-01 NOTE — TOC Initial Note (Signed)
Transition of Care [facility]) - Initial/Assessment Note    Patient Details  Name: Nancy Berg MRN: [id number] Date of Birth: Jan 15, 1946  Transition of Care [facility]) CM/SW Contact:    Allena Katz, LCSW Phone Number: 12/01/2022, 1:43 PM  Clinical Narrative:  CSW spoke with patients son who reports that she is a facility resident at [facility]. Son agreeable to rehab recommendations and states she has been to rehab before but not around [place]. Son would like referrals sent out in [facility] for rehab.                  Expected Discharge Plan: Skilled Nursing Facility     Patient Goals and CMS Choice   CMS Medicare.gov Compare Post Acute Care list provided to:: Patient Represenative (must comment) (Son)        Expected Discharge Plan and Services                                              Prior Living Arrangements/Services   Lives with:: Facility Resident Patient language and need for interpreter reviewed:: Yes Do you feel safe going back to the place where you live?: Yes      Need for Family Participation in Patient Care: Yes (Comment) Care giver support system in place?: Yes (comment)   Criminal Activity/Legal Involvement Pertinent to Current Situation/Hospitalization: No - Comment as needed  Activities of Daily Living Home Assistive Devices/Equipment: Walker (specify type) ADL Screening (condition at time of admission) Patient's cognitive ability adequate to safely complete daily activities?: Yes Is the patient deaf or have difficulty hearing?: No Does the patient have difficulty seeing, even when wearing glasses/contacts?: No Does the patient have difficulty concentrating, remembering, or making decisions?: Yes Patient able to express need for assistance with ADLs?: Yes Does the patient have difficulty dressing or bathing?: Yes Independently performs ADLs?: No Communication: Independent Dressing (OT): Needs assistance Is this a change from  baseline?: Pre-admission baseline Grooming: Needs assistance Is this a change from baseline?: Pre-admission baseline Feeding: Independent Bathing: Needs assistance Is this a change from baseline?: Pre-admission baseline Toileting: Needs assistance Is this a change from baseline?: Pre-admission baseline In/Out Bed: Needs assistance Is this a change from baseline?: Pre-admission baseline Walks in Home: Needs assistance Is this a change from baseline?: Pre-admission baseline Does the patient have difficulty walking or climbing stairs?: Yes Weakness of Legs: Both Weakness of Arms/Hands: None  Permission Sought/Granted Permission sought to share information with : Facility Medical sales representative, Family Supports                Emotional Assessment       Orientation: : Oriented to Self      Admission diagnosis:  Pulmonary embolism (HCC) [I26.99] Acute on chronic respiratory failure with hypoxia (HCC) [J96.21] Other acute pulmonary embolism, unspecified whether acute cor pulmonale present (HCC) [I26.99] Patient Active Problem List   Diagnosis Date Noted   Acute on chronic respiratory failure with hypoxia (HCC) 11/30/2022   Acute pulmonary embolism (HCC) 11/29/2022   Protein calorie malnutrition (HCC) 11/29/2022   Pulmonary embolism (HCC) 11/29/2022   Protein-calorie malnutrition, severe 11/29/2022   Genetic testing 07/12/2022   Type 2 diabetes mellitus without complications (HCC) 02/21/2022   Heart failure (HCC) 02/21/2022   Solitary pulmonary nodule 02/21/2022   Breast cancer (HCC) 12/28/2021   Other vitamin B12 deficiency anemias 11/15/2021   Osteoarthritis  11/15/2021   Malignant neoplasm of upper-outer quadrant of right breast in female, estrogen receptor negative (HCC) 10/27/2021   Goals of care, counseling/discussion 10/27/2021   Vitamin D deficiency 02/06/2020   Mass of neck 02/06/2020   Age-related osteoporosis without current pathological fracture 02/06/2020    Chronic obstructive pulmonary disease, unspecified (HCC) 02/06/2020   Hyperlipidemia 02/06/2020   Periodontal disease 02/06/2020   Primary osteoarthritis of both knees 02/06/2020   PCP:  Ellan Lambert, NP Pharmacy:   [facility] - [place], [place] - [address] [place] [address] Phone: [phone] Fax: [phone]     Social Determinants of Health (SDOH) Social History: SDOH Screenings   Food Insecurity: No Food Insecurity (11/29/2022)  Housing: Low Risk  (11/29/2022)  Transportation Needs: No Transportation Needs (11/29/2022)  Utilities: Not At Risk (11/29/2022)  Tobacco Use: Medium Risk (11/30/2022)   SDOH Interventions:     Readmission Risk Interventions     No data to display

## 2022-12-01 NOTE — NC FL2 (Signed)
[place] MEDICAID FL2 LEVEL OF CARE FORM     IDENTIFICATION  Patient Name: Nancy Berg Birthdate: 10-Jul-1946 Sex: female Admission Date (Current Location): 11/28/2022  [facility] and [place] Number:  Chiropodist and Address:  [facility], [address] [place], [place] [address]      Provider Number: [id number]  Attending Physician Name and Address:  Gillis Santa, MD  Relative Name and Phone Number:  [name], [name] [phone]    Current Level of Care: Hospital Recommended Level of Care: Skilled Nursing Facility Prior Approval Number:    Date Approved/Denied: 12/01/22 PASRR Number: [id number]  Discharge Plan: Home    Current Diagnoses: Patient Active Problem List   Diagnosis Date Noted   Acute on chronic respiratory failure with hypoxia (HCC) 11/30/2022   Acute pulmonary embolism (HCC) 11/29/2022   Protein calorie malnutrition (HCC) 11/29/2022   Pulmonary embolism (HCC) 11/29/2022   Protein-calorie malnutrition, severe 11/29/2022   Genetic testing 07/12/2022   Type 2 diabetes mellitus without complications (HCC) 02/21/2022   Heart failure (HCC) 02/21/2022   Solitary pulmonary nodule 02/21/2022   Breast cancer (HCC) 12/28/2021   Other vitamin B12 deficiency anemias 11/15/2021   Osteoarthritis 11/15/2021   Malignant neoplasm of upper-outer quadrant of right breast in female, estrogen receptor negative (HCC) 10/27/2021   Goals of care, counseling/discussion 10/27/2021   Vitamin D deficiency 02/06/2020   Mass of neck 02/06/2020   Age-related osteoporosis without current pathological fracture 02/06/2020   Chronic obstructive pulmonary disease, unspecified (HCC) 02/06/2020   Hyperlipidemia 02/06/2020   Periodontal disease 02/06/2020   Primary osteoarthritis of both knees 02/06/2020    Orientation RESPIRATION BLADDER Height & Weight     Self, Situation, Place  Normal Continent Weight: 85 lb 12.1 oz (38.9 kg) Height:   5\' 1"  (154.9 cm)  BEHAVIORAL SYMPTOMS/MOOD NEUROLOGICAL BOWEL NUTRITION STATUS      Continent Diet  AMBULATORY STATUS COMMUNICATION OF NEEDS Skin   Limited Assist Verbally Normal                       Personal Care Assistance Level of Assistance  Bathing, Feeding, Dressing Bathing Assistance: Limited assistance Feeding assistance: Limited assistance Dressing Assistance: Limited assistance     Functional Limitations Info  Sight, Hearing, Speech Sight Info: Adequate Hearing Info: Adequate Speech Info: Adequate    SPECIAL CARE FACTORS FREQUENCY  PT (By licensed PT), OT (By licensed OT)     PT Frequency: 5 times a week OT Frequency: 5 times a week            Contractures Contractures Info: Not present    Additional Factors Info  Code Status, Allergies Code Status Info: FULL Allergies Info: Penicillins           Current Medications (12/01/2022):  This is the current hospital active medication list Current Facility-Administered Medications  Medication Dose Route Frequency Provider Last Rate Last Admin   0.9 %  sodium chloride infusion  250 mL Intravenous PRN Annice Needy, MD       acetaminophen (TYLENOL) tablet 650 mg  650 mg Oral TID Annice Needy, MD   650 mg at 12/01/22 8413   And   acetaminophen (TYLENOL) tablet 650 mg  650 mg Oral BID PRN Annice Needy, MD       albuterol (PROVENTIL) (2.5 MG/3ML) 0.083% nebulizer solution 2.5 mg  2.5 mg Nebulization Q6H PRN Annice Needy, MD       ammonium lactate (LAC-HYDRIN) 12 %  lotion 1 Application  1 Application Topical Daily Annice Needy, MD   1 Application at 12/01/22 0840   apixaban (ELIQUIS) tablet 10 mg  10 mg Oral BID Jaynie Bream, RPH   10 mg at 12/01/22 1610   Followed by   Melene Muller ON 12/07/2022] apixaban (ELIQUIS) tablet 5 mg  5 mg Oral BID Jaynie Bream [facility]       aspirin EC tablet 81 mg  81 mg Oral Daily Annice Needy, MD   81 mg at 12/01/22 9604   atorvastatin (LIPITOR) tablet 20 mg  20 mg Oral Daily Annice Needy, MD   20 mg at 12/01/22 0828   budesonide (PULMICORT) nebulizer solution 0.5 mg  0.5 mg Inhalation Daily Annice Needy, MD   0.5 mg at 12/01/22 5409   calcium carbonate (TUMS - dosed in mg elemental calcium) chewable tablet 1,000 mg  1,000 mg Oral BID WC Annice Needy, MD   1,000 mg at 12/01/22 0830   cholecalciferol (VITAMIN D3) 25 MCG (1000 UNIT) tablet 2,000 Units  2,000 Units Oral Daily Annice Needy, MD   2,000 Units at 12/01/22 0830   feeding supplement (ENSURE ENLIVE / ENSURE PLUS) liquid 237 mL  237 mL Oral TID BM Annice Needy, MD   237 mL at 12/01/22 0836   furosemide (LASIX) injection 20 mg  20 mg Intravenous Q12H Gillis Santa, MD       latanoprost (XALATAN) 0.005 % ophthalmic solution 1 drop  1 drop Both Eyes QHS Annice Needy, MD   1 drop at 11/30/22 2021   magnesium oxide (MAG-OX) tablet 200 mg  200 mg Oral Daily Annice Needy, MD   200 mg at 12/01/22 0829   midodrine (PROAMATINE) tablet 10 mg  10 mg Oral TID WC Gillis Santa, MD       midodrine (PROAMATINE) tablet 5 mg  5 mg Oral Once Gillis Santa, MD       mirtazapine (REMERON) tablet 15 mg  15 mg Oral QHS Annice Needy, MD   15 mg at 11/30/22 2020   ondansetron (ZOFRAN) tablet 4 mg  4 mg Oral Q6H PRN Annice Needy, MD       Or   ondansetron (ZOFRAN) injection 4 mg  4 mg Intravenous Q6H PRN Annice Needy, MD       potassium chloride SA (KLOR-CON M) CR tablet 20 mEq  20 mEq Oral Daily Annice Needy, MD   20 mEq at 12/01/22 8119   senna (SENOKOT) tablet 8.6 mg  1 tablet Oral BID Annice Needy, MD   8.6 mg at 12/01/22 0830   sodium chloride flush (NS) 0.9 % injection 3 mL  3 mL Intravenous Q12H Annice Needy, MD   3 mL at 12/01/22 0836   sodium chloride flush (NS) 0.9 % injection 3 mL  3 mL Intravenous PRN Annice Needy, MD       traZODone (DESYREL) tablet 25 mg  25 mg Oral QHS PRN Annice Needy, MD       umeclidinium bromide (INCRUSE ELLIPTA) 62.5 MCG/ACT 1 puff  1 puff Inhalation Daily Annice Needy, MD   1 puff at 12/01/22 1478    Facility-Administered Medications Ordered in Other Encounters  Medication Dose Route Frequency Provider Last Rate Last Admin   diphenhydrAMINE (BENADRYL) 50 MG/ML injection            famotidine (PEPCID) 20-0.9 MG/50ML-% IVPB  Discharge Medications: Please see discharge summary for a list of discharge medications.  Relevant Imaging Results:  Relevant Lab Results:   Additional Information SS [id number]  Allena Katz, LCSW

## 2022-12-02 ENCOUNTER — Inpatient Hospital Stay: Payer: Medicare HMO

## 2022-12-02 DIAGNOSIS — I2609 Other pulmonary embolism with acute cor pulmonale: Secondary | ICD-10-CM | POA: Diagnosis not present

## 2022-12-02 LAB — BASIC METABOLIC PANEL
Anion gap: 9 (ref 5–15)
BUN: 24 mg/dL — ABNORMAL HIGH (ref 8–23)
CO2: 33 mmol/L — ABNORMAL HIGH (ref 22–32)
Calcium: 8.6 mg/dL — ABNORMAL LOW (ref 8.9–10.3)
Chloride: 96 mmol/L — ABNORMAL LOW (ref 98–111)
Creatinine, Ser: 0.52 mg/dL (ref 0.44–1.00)
GFR, Estimated: >60 mL/min (ref >60)
Glucose, Bld: 91 mg/dL (ref 70–99)
Potassium: 3.7 mmol/L (ref 3.5–5.1)
Sodium: 138 mmol/L (ref 135–145)

## 2022-12-02 LAB — PHOSPHORUS: Phosphorus: 3.5 mg/dL (ref 2.5–4.6)

## 2022-12-02 LAB — CBC
HCT: 39.7 % (ref 36.0–46.0)
Hemoglobin: 12.5 g/dL (ref 12.0–15.0)
MCH: 31.3 pg (ref 26.0–34.0)
MCHC: 31.5 g/dL (ref 30.0–36.0)
MCV: 99.5 fL (ref 80.0–100.0)
Platelets: 238 10*3/uL (ref 150–400)
RBC: 3.99 MIL/uL (ref 3.87–5.11)
RDW: 15 % (ref 11.5–15.5)
WBC: 13.1 10*3/uL — ABNORMAL HIGH (ref 4.0–10.5)
nRBC: 0 % (ref 0.0–0.2)

## 2022-12-02 LAB — MAGNESIUM: Magnesium: 1.7 mg/dL (ref 1.7–2.4)

## 2022-12-02 MED ORDER — ENSURE ENLIVE PO LIQD
237.0000 mL | Freq: Two times a day (BID) | ORAL | Status: DC
  Administered 2022-12-02 – 2022-12-05 (×6): 237 mL via ORAL

## 2022-12-02 MED ORDER — ADULT MULTIVITAMIN W/MINERALS CH
1.0000 | ORAL_TABLET | Freq: Every day | ORAL | Status: DC
  Administered 2022-12-02 – 2022-12-05 (×4): 1 via ORAL
  Filled 2022-12-02 (×4): qty 1

## 2022-12-02 NOTE — Progress Notes (Signed)
Nutrition Follow-up  DOCUMENTATION CODES:   Severe malnutrition in context of chronic illness, Underweight  INTERVENTION:   -MVI with minerals daily -Ensure Enlive po BID, each supplement provides 350 kcal and 20 grams of protein -Continue with regular diet  NUTRITION DIAGNOSIS:   Severe Malnutrition related to chronic illness as evidenced by severe fat depletion, severe muscle depletion.  Ongoing  GOAL:   Patient will meet greater than or equal to 90% of their needs  Progressing   MONITOR:   Diet advancement  REASON FOR ASSESSMENT:   Consult Assessment of nutrition requirement/status  ASSESSMENT:   77 y.o. female admits related to persistent hypoxemia. PMH includes: arthritis, cancer, CHF, COPD, dementia, HLD. Pt is currently receiving medical management related to pulmonary embolism.  4/30- s/p thrombectomy  Reviewed I/O's: +440 ml x 24 hours and +722 ml since admission   Pt sitting up, talking on phone at time of visit.   Pt on a regular diet and with good oral intake. Noted meal completions 50-100%.   No new wt since last visit.   Per TOC notes, pt is a resident of [facility]. SNF being recommended for discharge; TOC assisting with placement.   Medications reviewed and include calcium carbonate, vitamin D3, magnesium oxide, remeron, miralax, and potassium chloride.   Labs reviewed.   Diet Order:   Diet Order             Diet regular Room service appropriate? Yes; Fluid consistency: Thin  Diet effective now                   EDUCATION NEEDS:   Not appropriate for education at this time  Skin:  Skin Assessment: Reviewed RN Assessment  Last BM:  12/02/22 (type 7)  Height:   Ht Readings from Last 1 Encounters:  11/29/22 5\' 1"  (1.549 m)    Weight:   Wt Readings from Last 1 Encounters:  11/29/22 38.9 kg    Ideal Body Weight:  47.7 kg  BMI:  Body mass index is 16.2 kg/m.  Estimated Nutritional Needs:   Kcal:   1350-1550  Protein:  65-80 grams  Fluid:  > 1.3 L    Levada Schilling, RD, LDN, CDCES Registered Dietitian II Certified Diabetes Care and Education Specialist Please refer to [facility] for RD and/or RD on-call/weekend/after hours pager

## 2022-12-02 NOTE — TOC Progression Note (Signed)
Transition of Care [facility]) - Progression Note    Patient Details  Name: Nancy Berg MRN: [id number] Date of Birth: 08/07/1945  Transition of Care [facility]) CM/SW Contact  Allena Katz, LCSW Phone Number: 12/02/2022, 3:13 PM  Clinical Narrative:   CSW received call from daughter who reports she would like to go to [facility]. CSW will start auth if [facility] if still able to take her.     Expected Discharge Plan: Skilled Nursing Facility    Expected Discharge Plan and Services                                               Social Determinants of Health (SDOH) Interventions SDOH Screenings   Food Insecurity: No Food Insecurity (11/29/2022)  Housing: Low Risk  (11/29/2022)  Transportation Needs: No Transportation Needs (11/29/2022)  Utilities: Not At Risk (11/29/2022)  Tobacco Use: Medium Risk (11/30/2022)    Readmission Risk Interventions     No data to display

## 2022-12-02 NOTE — Progress Notes (Signed)
PT Cancellation Note  Patient Details Name: Aniecia Saldate MRN: [id number] DOB: Oct 01, 1945   Cancelled Treatment:    Reason Eval/Treat Not Completed: Other (comment). Pt sleeping soundly, PT to re-attempt as able.   Olga Coaster PT, DPT 1:27 PM,12/02/22

## 2022-12-02 NOTE — Care Management Important Message (Signed)
Important Message  Patient Details  Name: Nancy Berg MRN: [id number] Date of Birth: [birth year]   Medicare Important Message Given:  Yes     [name] 12/02/2022, 11:44 AM

## 2022-12-02 NOTE — Progress Notes (Signed)
Occupational Therapy Treatment Patient Details Name: Nancy Berg MRN: [id number] DOB: August 13, 1945 Today's Date: 12/02/2022   History of present illness Nancy Berg is a 77 y.o. 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  Had another episode where she was hypoxic at rest today and was sent to the ER for further evaluation.   OT comments  Pt seen for OT tx. Pt denies pain or SOB at start of session. SpO2 at rest 96% on 3L. Pt required MIN A for BLE mgt with bed mobility. Seated EOB, pt required set up for grooming tasks and demonstrated intermittent LOB to L, requiring intermittent SBA - CGA- MIN A to correct. Ultimately placed 2 pillows to help pt prop LUE on and pt was able to maintain supported static sitting balance to complete tasks. SpO2 90%, denied SOB. Improved to 94% on 3L once returned to supine within a couple minutes. Pt instructed in PLB during session and activity pacing. Pt continues to benefit from skilled OT services. Continue POC.    Recommendations for follow up therapy are one component of a multi-disciplinary discharge planning process, led by the attending physician.  Recommendations may be updated based on patient status, additional functional criteria and insurance authorization.    Assistance Recommended at Discharge Frequent or constant Supervision/Assistance  Patient can return home with the following  Assistance with cooking/housework;Assist for transportation;Help with stairs or ramp for entrance;Direct supervision/assist for medications management;Direct supervision/assist for financial management;A little help with walking and/or transfers;A little help with bathing/dressing/bathroom   Equipment Recommendations   Other (comment) (defer)    Recommendations for Other Services      Precautions / Restrictions Precautions Precautions: Fall Restrictions Weight Bearing Restrictions: No       Mobility Bed Mobility Overal bed mobility: Needs Assistance Bed Mobility: Supine to Sit, Sit to Supine     Supine to sit: Min assist Sit to supine: Min assist   General bed mobility comments: MIN A for BLE mgt    Transfers Overall transfer level: Needs assistance   Transfers: Bed to chair/wheelchair/BSC            Lateral/Scoot Transfers: Min guard, Min assist       Balance Overall balance assessment: Needs assistance Sitting-balance support: Feet supported, No upper extremity supported Sitting balance-Leahy Scale: Poor Sitting balance - Comments: intermittent SBA-CGA-MIN A to correct for L lateral lean/LOB, ultimately placed 2 pillows to prop LUE on and pt able to maintain supported sitting balance for grooming tasks.                                   ADL either performed or assessed with clinical judgement   ADL Overall ADL's : Needs assistance/impaired     Grooming: Sitting;Set up;Min guard;Oral care;Wash/dry face Grooming Details (indicate cue type and reason): CGA-MIN A for static sitting balance to prevent L lateral LOB, difficulty noted with opening toothpaste requiring assist 2/2 arthritis in hands per pt report                                    Extremity/Trunk Assessment  Vision       Perception     Praxis      Cognition Arousal/Alertness: Awake/alert Behavior During Therapy: WFL for tasks assessed/performed Overall Cognitive Status: History of cognitive impairments - at baseline                                 General Comments: Per chart review, pt with h/o dementia at baseline. Pleasant & cooperative, followed commands wells        Exercises Other Exercises Other Exercises: Pt instructed in PLB and  activity pacing    Shoulder Instructions       General Comments      Pertinent Vitals/ Pain       Pain Assessment Pain Assessment: No/denies pain  Home Living                                          Prior Functioning/Environment              Frequency  Min 3X/week        Progress Toward Goals  OT Goals(current goals can now be found in the care plan section)  Progress towards OT goals: Progressing toward goals  Acute Rehab OT Goals Patient Stated Goal: return to ALF OT Goal Formulation: With patient/family Time For Goal Achievement: 12/14/22 Potential to Achieve Goals: Fair  Plan Discharge plan remains appropriate;Frequency remains appropriate    Co-evaluation                 AM-PAC OT "6 Clicks" Daily Activity     Outcome Measure   Help from another person eating meals?: A Little Help from another person taking care of personal grooming?: A Little Help from another person toileting, which includes using toliet, bedpan, or urinal?: A Little Help from another person bathing (including washing, rinsing, drying)?: A Lot Help from another person to put on and taking off regular upper body clothing?: A Little Help from another person to put on and taking off regular lower body clothing?: A Lot 6 Click Score: 16    End of Session Equipment Utilized During Treatment: Oxygen  OT Visit Diagnosis: Other abnormalities of gait and mobility (R26.89);Muscle weakness (generalized) (M62.81)   Activity Tolerance Patient tolerated treatment well   Patient Left in bed;with call bell/phone within reach;with bed alarm set;with nursing/sitter in room   Nurse Communication          Time: 1610-9604 OT Time Calculation (min): 18 min  Charges: OT General Charges $OT Visit: 1 Visit OT Treatments $Self Care/Home Management : 8-22 mins  Arman Filter., MPH, MS, OTR/L ascom [phone] 12/02/22, 1:00 PM

## 2022-12-02 NOTE — Progress Notes (Signed)
[facility] Progress Note  Patient: Nancy Berg    MVH:[id number]  DOA: 11/28/2022     Date of Service: the patient was seen and examined on 12/02/2022  Chief Complaint  Patient presents with   Low O2   Brief hospital course: Ms. 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 She is continue on immunologic therapy for her breast cancer. At the long-term care facility where she resides she was noted to have increased hypoxemia with O2 sats in the 70-80's. Due to persistent hypoxemia she was transported to [facility]ED for evaluation.     ED Course: Afebrile, 119/58  82  RR 18. Chroncally ill, emaciated woman presenting SOB with hypoxemia  on 2L [place] oxygen. EDP exam unremarkable. Lab: VBG 7.45/55/<31, Glucose 111, lactic acid 1.2  WBC 11.8, Hgb 13.2, Plt 245. CXR increased basilar markings c/w atelectasis vs PNA. CTA - PE Left main pulmnary artery extending to left lower lobe with evidence of right heart strain; severe emphysematous changes. In ED patient started on IV heparin. TRH called to admit for continued management.   5/3: Medically stable.  Awaiting disposition.  Assessment and Plan:  #Acute pulmonary embolism with right heart strain S/p mechanical thrombectomy by vascular surgery on 4/30th. S/p heparin IV infusion, 5/1 started Eliquis 10 mg p.o. twice daily for 7 days followed by 5 mg p.o. twice daily Continue supplemental O2 inhalation and gradually wean down to 2L baseline TTE LVEF 60 to 65%, grade 1 diastolic dysfunction, moderate pulmonary hypertension Venous duplex lower extremity negative for DVT -Continue with Eliquis  Acute on chronic hypoxic respiratory failure Patient is on oxygen 2 L at baseline Patient is desatting with little movement BNP 115 slightly elevated  Hypotension H/o hypertension, held metoprolol for now On 5/2, noticed  low blood pressure, started midodrine 10 mg p.o. 3 times daily Monitor BP and titrate medications accordingly  HLD, Chronic diastolic CHF BNP 841 slightly elevated Clinically appears euvolemic Continue to monitor  PVD, continue aspirin and statin  COPD, no exacerbation noticed Patient follows Dr. Meredeth Ide pulmonologist for severe emphysema, last OV 10/14/2022-goal discharge 3-4 stable on current regimen and 2 L oxygen Continue Pulmicort inhaler daily, Incruse Ellipta inhaler daily and albuterol nebulizer as needed  Breast cancer  Patient s/p mastectomy, adjuvant XRT and a course of carbo-taxol chemotherapy with addition of Keytruda. F/u out pt as Per oncology   Severe protein calorie malnutrition  Patient very thin - appears emaciated. Plan     Total protein and prealbumin RD consult   Body mass index is 16.2 kg/m.  Interventions:     Diet: Regular diet DVT Prophylaxis: Therapeutic Anticoagulation with Eliquis    Advance goals of care discussion: Full code  Family Communication:   Disposition:  Pt is from ALF, admitted with pulmonary embolism with right heart strain, vascular surgery consulted s/p thrombectomy done on 4/30, s/p heparin IV infusion, PT and OT eval done, may need SNF placement, TOC is working on discharge planning. Patient is becoming medically stable-awaiting disposition.  Most likely will spend the weekend as need to go to SNF  Subjective: Patient was little somnolent but easily arousable when seen today.  Stating that she did not get a good night sleep so would like to get take a nap.  Denies any pain.   Physical Exam: General.  Frail and emaciated elderly lady, in no acute distress. Pulmonary.  Lungs  clear bilaterally, normal respiratory effort. CV.  Regular rate and rhythm, no JVD, rub or murmur. Abdomen.  Soft, nontender, nondistended, BS positive. CNS.  Somnolent but arousable.  No focal neurologic deficit. Extremities.  No edema, no cyanosis,  pulses intact and symmetrical.  Vitals:   12/02/22 0435 12/02/22 0727 12/02/22 0736 12/02/22 1150  BP: 125/66  97/68 109/60  Pulse: (!) 104  93 77  Resp: 20  20 20   Temp: 98.4 F (36.9 C)  98.4 F (36.9 C) 98.5 F (36.9 C)  TempSrc:      SpO2: 94% 94% 98% 95%  Weight:      Height:        Intake/Output Summary (Last 24 hours) at 12/02/2022 1531 Last data filed at 12/01/2022 1911 Gross per 24 hour  Intake 100 ml  Output --  Net 100 ml    Filed Weights   11/28/22 2300 11/29/22 0410  Weight: 38.6 kg 38.9 kg    Data Reviewed: I have personally reviewed and interpreted daily labs, tele strips, imagings as discussed above. I reviewed all nursing notes, pharmacy notes, vitals, pertinent old records I have discussed plan of care as described above with RN and patient/family.  CBC: Recent Labs  Lab 11/28/22 1451 11/29/22 0829 11/30/22 0055 12/01/22 0500 12/02/22 0604  WBC 11.8* 10.6* 12.6* 11.7* 13.1*  HGB 13.2 12.4 11.5* 11.7* 12.5  HCT 42.6 39.6 36.7 37.6 39.7  MCV 100.0 99.0 99.7 100.8* 99.5  PLT 245 213 214 211 238    Basic Metabolic Panel: Recent Labs  Lab 11/28/22 1451 11/30/22 0055 12/01/22 0500 12/02/22 0604  NA 140 141 140 138  K 3.9 3.6 4.0 3.7  CL 100 102 101 96*  CO2 31 32 32 33*  GLUCOSE 111* 109* 92 91  BUN 20 24* 23 24*  CREATININE 0.77 0.55 0.35* 0.52  CALCIUM 9.6 8.7* 8.8* 8.6*  MG  --  1.7 1.7 1.7  PHOS  --  3.9 3.8 3.5     Studies: DG Chest Port 1 View  Result Date: 12/02/2022 CLINICAL DATA:  141880 SOB (shortness of breath) 141880 EXAM: PORTABLE CHEST 1 VIEW COMPARISON:  Radiograph 11/28/2022 FINDINGS: Unchanged cardiomediastinal silhouette. Unchanged elevated left hemidiaphragm. Unchanged small effusions and left basilar atelectasis. Emphysema with scattered lung scarring. No new airspace disease. No evidence of pneumothorax. Bones are unchanged. IMPRESSION: Unchanged elevated left hemidiaphragm with small pleural effusions and left  basilar atelectasis. No new airspace disease. Electronically Signed   By: Caprice Renshaw M.D.   On: 12/02/2022 08:54    Scheduled Meds:  acetaminophen  650 mg Oral TID   ammonium lactate  1 Application Topical Daily   apixaban  10 mg Oral BID   Followed by   Melene Muller ON 12/07/2022] apixaban  5 mg Oral BID   aspirin EC  81 mg Oral Daily   atorvastatin  20 mg Oral Daily   budesonide  0.5 mg Inhalation Daily   calcium carbonate  1,000 mg Oral BID WC   cholecalciferol  2,000 Units Oral Daily   feeding supplement  237 mL Oral BID BM   latanoprost  1 drop Both Eyes QHS   magnesium oxide  200 mg Oral Daily   midodrine  10 mg Oral TID WC   mineral oil  1 enema Rectal Once   mirtazapine  15 mg Oral QHS   multivitamin with minerals  1 tablet Oral Daily   polyethylene glycol  17 g Oral BID   potassium chloride SA  20 mEq Oral Daily   sodium chloride flush  3 mL Intravenous Q12H   umeclidinium bromide  1 puff Inhalation Daily   Continuous Infusions:  sodium chloride     PRN Meds: sodium chloride, acetaminophen **AND** acetaminophen, albuterol, bisacodyl, bisacodyl, ondansetron **OR** ondansetron (ZOFRAN) IV, sodium chloride flush, traZODone  Time spent: 40 minutes  This record has been created using Conservation officer, historic buildings. Errors have been sought and corrected,but may not always be located. Such creation errors do not reflect on the standard of care.   Author: Arnetha Courser. MD [facility] 12/02/2022 3:31 PM  To reach On-call, see care teams to locate the attending and reach out to them via [email or web address]. If 7PM-7AM, please contact night-coverage If you still have difficulty reaching the attending provider, please page the [facility] (Director on Call) for [facility] on amion for assistance.

## 2022-12-03 DIAGNOSIS — I2609 Other pulmonary embolism with acute cor pulmonale: Secondary | ICD-10-CM | POA: Diagnosis not present

## 2022-12-03 LAB — BASIC METABOLIC PANEL
Anion gap: 8 (ref 5–15)
BUN: 23 mg/dL (ref 8–23)
CO2: 31 mmol/L (ref 22–32)
Calcium: 8.9 mg/dL (ref 8.9–10.3)
Chloride: 97 mmol/L — ABNORMAL LOW (ref 98–111)
Creatinine, Ser: 0.43 mg/dL — ABNORMAL LOW (ref 0.44–1.00)
GFR, Estimated: >60 mL/min (ref >60)
Glucose, Bld: 98 mg/dL (ref 70–99)
Potassium: 4 mmol/L (ref 3.5–5.1)
Sodium: 136 mmol/L (ref 135–145)

## 2022-12-03 LAB — URINALYSIS, COMPLETE (UACMP) WITH MICROSCOPIC
RBC / HPF: >50 RBC/hpf (ref 0–5)
Specific Gravity, Urine: 1.018 (ref 1.005–1.030)
Squamous Epithelial / HPF: NONE SEEN /HPF (ref 0–5)
WBC, UA: >50 WBC/hpf (ref 0–5)

## 2022-12-03 LAB — CBC
HCT: 38.7 % (ref 36.0–46.0)
Hemoglobin: 12.2 g/dL (ref 12.0–15.0)
MCH: 31.2 pg (ref 26.0–34.0)
MCHC: 31.5 g/dL (ref 30.0–36.0)
MCV: 99 fL (ref 80.0–100.0)
Platelets: 236 10*3/uL (ref 150–400)
RBC: 3.91 MIL/uL (ref 3.87–5.11)
RDW: 15.2 % (ref 11.5–15.5)
WBC: 13 10*3/uL — ABNORMAL HIGH (ref 4.0–10.5)
nRBC: 0 % (ref 0.0–0.2)

## 2022-12-03 MED ORDER — CALCIUM CARBONATE ANTACID 500 MG PO CHEW
2.0000 | CHEWABLE_TABLET | Freq: Two times a day (BID) | ORAL | Status: DC
  Administered 2022-12-04 – 2022-12-05 (×3): 400 mg via ORAL
  Filled 2022-12-03 (×3): qty 2

## 2022-12-03 NOTE — TOC Progression Note (Signed)
Transition of Care [facility]) - Progression Note    Patient Details  Name: Loanne Lamarre MRN: [id number] Date of Birth: 1945/08/12  [facility] CM/SW Contact  Liliana Cline, LCSW Phone Number: 12/03/2022, 9:45 AM  Clinical Narrative:    Notified by [facility] Assistant that patient's insurance authorization is approved for [facility]. They do not take patients over the weekend at this time.    Expected Discharge Plan: Skilled Nursing Facility    Expected Discharge Plan and Services                                               Social Determinants of Health (SDOH) Interventions SDOH Screenings   Food Insecurity: No Food Insecurity (11/29/2022)  Housing: Low Risk  (11/29/2022)  Transportation Needs: No Transportation Needs (11/29/2022)  Utilities: Not At Risk (11/29/2022)  Tobacco Use: Medium Risk (11/30/2022)    Readmission Risk Interventions     No data to display

## 2022-12-03 NOTE — Progress Notes (Addendum)
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 She is continue on immunologic therapy for her breast cancer. At the long-term care facility where she resides she was noted to have increased hypoxemia with O2 sats in the 70-80's. Due to persistent hypoxemia she was transported to [facility]ED for evaluation.     ED Course: Afebrile, 119/58  82  RR 18. Chroncally ill, emaciated woman presenting SOB with hypoxemia  on 2L [place] oxygen. EDP exam unremarkable. Lab: VBG 7.45/55/<31, Glucose 111, lactic acid 1.2  WBC 11.8, Hgb 13.2, Plt 245. CXR increased basilar markings c/w atelectasis vs PNA. CTA - PE Left main pulmnary artery extending to left lower lobe with evidence of right heart strain; severe emphysematous changes. In ED patient started on IV heparin. TRH called to admit for continued management.   5/3: Medically stable.  Awaiting disposition.  5/4: Vital stable.  Chest x-ray yesterday with unchanged elevation of left hemidiaphragm with small pleural effusions and left basilar atelectasis.  No new airspace disease. CBC with persistent leukocytosis at 13. Nursing concern of seeing some blood in urine-UA ordered. Hemoglobin stable, UA with significant hematuria-holding Eliquis and aspirin. Likely will go to SNF on Monday.  Assessment and Plan:  #Acute pulmonary embolism with right heart strain S/p mechanical thrombectomy by vascular surgery on 4/30th. S/p heparin IV infusion, 5/1 started Eliquis 10 mg p.o. twice daily for 7 days followed by 5 mg p.o. twice daily Continue supplemental O2 inhalation and gradually wean down to 2L  baseline TTE LVEF 60 to 65%, grade 1 diastolic dysfunction, moderate pulmonary hypertension Venous duplex lower extremity negative for DVT -Continue with Eliquis  Acute on chronic hypoxic respiratory failure Patient is on oxygen 2 L at baseline Patient is desatting with little movement BNP 115 slightly elevated  Hypotension H/o hypertension, held metoprolol for now On 5/2, noticed low blood pressure, started midodrine 10 mg p.o. 3 times daily Monitor BP and titrate medications accordingly  HLD, Chronic diastolic CHF BNP 811 slightly elevated Clinically appears euvolemic Continue to monitor  PVD, continue aspirin and statin  COPD, no exacerbation noticed Patient follows Dr. Meredeth Ide pulmonologist for severe emphysema, last OV 10/14/2022-goal discharge 3-4 stable on current regimen and 2 L oxygen Continue Pulmicort inhaler daily, Incruse Ellipta inhaler daily and albuterol nebulizer as needed  Breast cancer  Patient s/p mastectomy, adjuvant XRT and a course of carbo-taxol chemotherapy with addition of Keytruda. F/u out pt as Per oncology   Severe protein calorie malnutrition  Patient very thin - appears emaciated. Plan     Total protein and prealbumin RD consult   Body mass index is 16.2 kg/m.  Interventions:     Diet: Regular diet DVT Prophylaxis: Therapeutic Anticoagulation with Eliquis    Advance goals of care discussion: Full code  Family Communication: Talked with son on phone.  Disposition:  Pt is from ALF, admitted with pulmonary embolism with right heart strain, vascular surgery consulted s/p thrombectomy done on 4/30, s/p heparin IV infusion, PT and OT eval done, may need SNF placement, TOC is working on discharge planning. Patient is becoming medically stable-awaiting disposition.  Most likely will  spend the weekend as need to go to SNF  Subjective: Patient was eating breakfast when seen today.  No new complaints.  Oxygen at baseline.   Physical  Exam: General.  Frail and emaciated elderly lady, in no acute distress. Pulmonary.  Lungs clear bilaterally, normal respiratory effort. CV.  Regular rate and rhythm, no JVD, rub or murmur. Abdomen.  Soft, nontender, nondistended, BS positive. CNS.  Alert and oriented .  No focal neurologic deficit. Extremities.  No edema, no cyanosis, pulses intact and symmetrical. Psychiatry.  Judgment and insight appears normal.   Vitals:   12/03/22 0026 12/03/22 0446 12/03/22 0740 12/03/22 0805  BP: 115/60 132/74  105/63  Pulse: 79 88 98 89  Resp: 18 18 16 19   Temp: 98.2 F (36.8 C) 98.2 F (36.8 C)  97.7 F (36.5 C)  TempSrc:      SpO2: 91% 96% 94% 90%  Weight:      Height:        Intake/Output Summary (Last 24 hours) at 12/03/2022 0858 Last data filed at 12/02/2022 1633 Gross per 24 hour  Intake 200 ml  Output --  Net 200 ml    Filed Weights   11/28/22 2300 11/29/22 0410  Weight: 38.6 kg 38.9 kg    Data Reviewed: I have personally reviewed and interpreted daily labs, tele strips, imagings as discussed above. I reviewed all nursing notes, pharmacy notes, vitals, pertinent old records I have discussed plan of care as described above with RN and patient/family.  CBC: Recent Labs  Lab 11/29/22 0829 11/30/22 0055 12/01/22 0500 12/02/22 0604 12/03/22 0522  WBC 10.6* 12.6* 11.7* 13.1* 13.0*  HGB 12.4 11.5* 11.7* 12.5 12.2  HCT 39.6 36.7 37.6 39.7 38.7  MCV 99.0 99.7 100.8* 99.5 99.0  PLT 213 214 211 238 236    Basic Metabolic Panel: Recent Labs  Lab 11/28/22 1451 11/30/22 0055 12/01/22 0500 12/02/22 0604 12/03/22 0522  NA 140 141 140 138 136  K 3.9 3.6 4.0 3.7 4.0  CL 100 102 101 96* 97*  CO2 31 32 32 33* 31  GLUCOSE 111* 109* 92 91 98  BUN 20 24* 23 24* 23  CREATININE 0.77 0.55 0.35* 0.52 0.43*  CALCIUM 9.6 8.7* 8.8* 8.6* 8.9  MG  --  1.7 1.7 1.7  --   PHOS  --  3.9 3.8 3.5  --      Studies: No results found.  Scheduled Meds:  acetaminophen  650 mg Oral TID    ammonium lactate  1 Application Topical Daily   apixaban  10 mg Oral BID   Followed by   Melene Muller ON 12/07/2022] apixaban  5 mg Oral BID   aspirin EC  81 mg Oral Daily   atorvastatin  20 mg Oral Daily   budesonide  0.5 mg Inhalation Daily   calcium carbonate  1,000 mg Oral BID WC   cholecalciferol  2,000 Units Oral Daily   feeding supplement  237 mL Oral BID BM   latanoprost  1 drop Both Eyes QHS   magnesium oxide  200 mg Oral Daily   midodrine  10 mg Oral TID WC   mineral oil  1 enema Rectal Once   mirtazapine  15 mg Oral QHS   multivitamin with minerals  1 tablet Oral Daily   polyethylene glycol  17 g Oral BID   potassium chloride SA  20 mEq Oral Daily   sodium chloride flush  3 mL Intravenous Q12H   umeclidinium bromide  1  puff Inhalation Daily   Continuous Infusions:  sodium chloride     PRN Meds: sodium chloride, acetaminophen **AND** acetaminophen, albuterol, bisacodyl, bisacodyl, ondansetron **OR** ondansetron (ZOFRAN) IV, sodium chloride flush, traZODone  Time spent: 40 minutes  This record has been created using Conservation officer, historic buildings. Errors have been sought and corrected,but may not always be located. Such creation errors do not reflect on the standard of care.   Author: Arnetha Courser. MD [facility] 12/03/2022 8:58 AM  To reach On-call, see care teams to locate the attending and reach out to them via [email or web address]. If 7PM-7AM, please contact night-coverage If you still have difficulty reaching the attending provider, please page the [facility] (Director on Call) for [facility] on amion for assistance.

## 2022-12-04 DIAGNOSIS — I2609 Other pulmonary embolism with acute cor pulmonale: Secondary | ICD-10-CM | POA: Diagnosis not present

## 2022-12-04 LAB — CBC
HCT: 38.3 % (ref 36.0–46.0)
Hemoglobin: 11.9 g/dL — ABNORMAL LOW (ref 12.0–15.0)
MCH: 30.8 pg (ref 26.0–34.0)
MCHC: 31.1 g/dL (ref 30.0–36.0)
MCV: 99.2 fL (ref 80.0–100.0)
Platelets: 246 10*3/uL (ref 150–400)
RBC: 3.86 MIL/uL — ABNORMAL LOW (ref 3.87–5.11)
RDW: 15.1 % (ref 11.5–15.5)
WBC: 11.4 10*3/uL — ABNORMAL HIGH (ref 4.0–10.5)
nRBC: 0 % (ref 0.0–0.2)

## 2022-12-04 LAB — BASIC METABOLIC PANEL
Anion gap: 10 (ref 5–15)
BUN: 26 mg/dL — ABNORMAL HIGH (ref 8–23)
CO2: 26 mmol/L (ref 22–32)
Calcium: 9.1 mg/dL (ref 8.9–10.3)
Chloride: 101 mmol/L (ref 98–111)
Creatinine, Ser: 0.44 mg/dL (ref 0.44–1.00)
GFR, Estimated: >60 mL/min (ref >60)
Glucose, Bld: 82 mg/dL (ref 70–99)
Potassium: 4.2 mmol/L (ref 3.5–5.1)
Sodium: 137 mmol/L (ref 135–145)

## 2022-12-04 MED ORDER — APIXABAN 5 MG PO TABS
5.0000 mg | ORAL_TABLET | Freq: Two times a day (BID) | ORAL | Status: DC
  Administered 2022-12-04 – 2022-12-05 (×2): 5 mg via ORAL
  Filled 2022-12-04 (×2): qty 1

## 2022-12-04 NOTE — Progress Notes (Signed)
[facility] Progress Note  Patient: Nancy Berg    ZOX:[id number]  DOA: 11/28/2022     Date of Service: the patient was seen and examined on 12/04/2022  Chief Complaint  Patient presents with   Low O2   Brief hospital course: Ms. 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 She is continue on immunologic therapy for her breast cancer. At the long-term care facility where she resides she was noted to have increased hypoxemia with O2 sats in the 70-80's. Due to persistent hypoxemia she was transported to [facility]ED for evaluation.     ED Course: Afebrile, 119/58  82  RR 18. Chroncally ill, emaciated woman presenting SOB with hypoxemia  on 2L [place] oxygen. EDP exam unremarkable. Lab: VBG 7.45/55/<31, Glucose 111, lactic acid 1.2  WBC 11.8, Hgb 13.2, Plt 245. CXR increased basilar markings c/w atelectasis vs PNA. CTA - PE Left main pulmnary artery extending to left lower lobe with evidence of right heart strain; severe emphysematous changes. In ED patient started on IV heparin. TRH called to admit for continued management.   5/3: Medically stable.  Awaiting disposition.  5/4: Vital stable.  Chest x-ray yesterday with unchanged elevation of left hemidiaphragm with small pleural effusions and left basilar atelectasis.  No new airspace disease. CBC with persistent leukocytosis at 13. Nursing concern of seeing some blood in urine-UA ordered. Hemoglobin stable, UA with significant hematuria-holding Eliquis and aspirin. Likely will go to SNF on Monday.  5/5: Vitals and hemoglobin stable.  Hematuria resolved.  Restarting Eliquis only at a lower dose, we will discontinue aspirin to decrease the risk of her bleeding. Can go to [facility] if remains stable.  Assessment and Plan:  #Acute pulmonary embolism with right heart strain S/p mechanical  thrombectomy by vascular surgery on 4/30th. S/p heparin IV infusion, 5/1 started Eliquis 10 mg p.o. twice daily for 7 days followed by 5 mg p.o. twice daily Continue supplemental O2 inhalation and gradually wean down to 2L baseline TTE LVEF 60 to 65%, grade 1 diastolic dysfunction, moderate pulmonary hypertension Venous duplex lower extremity negative for DVT Eliquis was held yesterday at due to development of gross hematuria which has been resolved with stable hemoglobin. -Restart Eliquis at a lower dose  Acute on chronic hypoxic respiratory failure Patient is on oxygen 2 L at baseline Patient is desatting with little movement BNP 115 slightly elevated  Hypotension H/o hypertension, held metoprolol for now On 5/2, noticed low blood pressure, started midodrine 10 mg p.o. 3 times daily Monitor BP and titrate medications accordingly  HLD, Chronic diastolic CHF BNP 811 slightly elevated Clinically appears euvolemic Continue to monitor  PVD, patient developed hematuria while on Eliquis and aspirin both.  Restarting Eliquis and we will discontinue aspirin for now  COPD, no exacerbation noticed Patient follows Dr. Meredeth Ide pulmonologist for severe emphysema, last OV 10/14/2022-goal discharge 3-4 stable on current regimen and 2 L oxygen Continue Pulmicort inhaler daily, Incruse Ellipta inhaler daily and albuterol nebulizer as needed  Breast cancer  Patient s/p mastectomy, adjuvant XRT and a course of carbo-taxol chemotherapy with addition of Keytruda. F/u out pt as Per oncology   Severe protein calorie malnutrition  Patient very thin - appears emaciated. Plan     Total protein and prealbumin RD consult   Body mass index is 16.2 kg/m.  Interventions:     Diet: Regular diet DVT Prophylaxis: Therapeutic  Anticoagulation with Eliquis    Advance goals of care discussion: Full code  Family Communication: Called son with no response today.  Disposition:  Pt is from ALF, admitted  with pulmonary embolism with right heart strain, vascular surgery consulted s/p thrombectomy done on 4/30, s/p heparin IV infusion, PT and OT eval done, may need SNF placement, TOC is working on discharge planning. Patient is becoming medically stable-awaiting disposition.  Most likely will spend the weekend as need to go to SNF  Subjective: Patient seen and examined today.  Urine has been cleared now.  Denies any new concerns.   Physical Exam: General.  Frail and emaciated elderly lady, in no acute distress. Pulmonary.  Lungs clear bilaterally, normal respiratory effort. CV.  Regular rate and rhythm, no JVD, rub or murmur. Abdomen.  Soft, nontender, nondistended, BS positive. CNS.  Alert and oriented .  No focal neurologic deficit. Extremities.  No edema, no cyanosis, pulses intact and symmetrical. Psychiatry.  Judgment and insight appears normal.   Vitals:   12/04/22 0729 12/04/22 0850 12/04/22 0857 12/04/22 1200  BP:  104/61 101/60 100/60  Pulse: 84 (!) 101 98 88  Resp: 18 17 17    Temp:  (!) 97.5 F (36.4 C) 98 F (36.7 C)   TempSrc:      SpO2: 94% (!) 89% 92%   Weight:      Height:        Intake/Output Summary (Last 24 hours) at 12/04/2022 1436 Last data filed at 12/04/2022 0600 Gross per 24 hour  Intake 240 ml  Output 400 ml  Net -160 ml    Filed Weights   11/28/22 2300 11/29/22 0410  Weight: 38.6 kg 38.9 kg    Data Reviewed: I have personally reviewed and interpreted daily labs, tele strips, imagings as discussed above. I reviewed all nursing notes, pharmacy notes, vitals, pertinent old records I have discussed plan of care as described above with RN and patient/family.  CBC: Recent Labs  Lab 11/30/22 0055 12/01/22 0500 12/02/22 0604 12/03/22 0522 12/04/22 0616  WBC 12.6* 11.7* 13.1* 13.0* 11.4*  HGB 11.5* 11.7* 12.5 12.2 11.9*  HCT 36.7 37.6 39.7 38.7 38.3  MCV 99.7 100.8* 99.5 99.0 99.2  PLT 214 211 238 236 246    Basic Metabolic Panel: Recent Labs   Lab 11/30/22 0055 12/01/22 0500 12/02/22 0604 12/03/22 0522 12/04/22 0457  NA 141 140 138 136 137  K 3.6 4.0 3.7 4.0 4.2  CL 102 101 96* 97* 101  CO2 32 32 33* 31 26  GLUCOSE 109* 92 91 98 82  BUN 24* 23 24* 23 26*  CREATININE 0.55 0.35* 0.52 0.43* 0.44  CALCIUM 8.7* 8.8* 8.6* 8.9 9.1  MG 1.7 1.7 1.7  --   --   PHOS 3.9 3.8 3.5  --   --      Studies: No results found.  Scheduled Meds:  acetaminophen  650 mg Oral TID   ammonium lactate  1 Application Topical Daily   atorvastatin  20 mg Oral Daily   budesonide  0.5 mg Inhalation Daily   calcium carbonate  2 tablet Oral BID WC   cholecalciferol  2,000 Units Oral Daily   feeding supplement  237 mL Oral BID BM   latanoprost  1 drop Both Eyes QHS   magnesium oxide  200 mg Oral Daily   midodrine  10 mg Oral TID WC   mineral oil  1 enema Rectal Once   mirtazapine  15 mg Oral QHS  multivitamin with minerals  1 tablet Oral Daily   polyethylene glycol  17 g Oral BID   potassium chloride SA  20 mEq Oral Daily   sodium chloride flush  3 mL Intravenous Q12H   umeclidinium bromide  1 puff Inhalation Daily   Continuous Infusions:  sodium chloride     PRN Meds: sodium chloride, acetaminophen **AND** acetaminophen, albuterol, bisacodyl, bisacodyl, ondansetron **OR** ondansetron (ZOFRAN) IV, sodium chloride flush, traZODone  Time spent: 40 minutes  This record has been created using Conservation officer, historic buildings. Errors have been sought and corrected,but may not always be located. Such creation errors do not reflect on the standard of care.   Author: Arnetha Courser. MD [facility] 12/04/2022 2:36 PM  To reach On-call, see care teams to locate the attending and reach out to them via [email or web address]. If 7PM-7AM, please contact night-coverage If you still have difficulty reaching the attending provider, please page the [facility] (Director on Call) for [facility] on amion for assistance.

## 2022-12-04 NOTE — TOC Progression Note (Addendum)
Transition of Care [facility]) - Progression Note    Patient Details  Name: Nancy Berg MRN: [id number] Date of Birth: 1945/08/21  Transition of Care [facility]) CM/SW Contact  Liliana Cline, LCSW Phone Number: 12/04/2022, 9:25 AM  Clinical Narrative:    Left VM for [facility] requesting follow up to confirm if patient can DC to [facility].   2:30- Call from Tiffany at [facility] who confirms patient can come tomorrow.    Expected Discharge Plan: Skilled Nursing Facility    Expected Discharge Plan and Services                                               Social Determinants of Health (SDOH) Interventions SDOH Screenings   Food Insecurity: No Food Insecurity (11/29/2022)  Housing: Low Risk  (11/29/2022)  Transportation Needs: No Transportation Needs (11/29/2022)  Utilities: Not At Risk (11/29/2022)  Tobacco Use: Medium Risk (11/30/2022)    Readmission Risk Interventions     No data to display

## 2022-12-04 NOTE — Progress Notes (Signed)
Physical Therapy Treatment Patient Details Name: Nancy Berg MRN: [id number] DOB: [birth year] Today's Date: 12/04/2022   History of Present Illness Nancy Berg is a 77 y.o. 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  Had another episode where she was hypoxic at rest today and was sent to the ER for further evaluation.    PT Comments    Agrees to session.  Bed mobility with supervision but she does need increased time and cues to reposition in bed.  Stands with min a x 1 and is able to progress gait 40' with RW and min a x 1.  She does have a significant scoliosis which does affect gait quality and balance.  Opts to return to bed after session for comfort.  Stated she lives in ALF and they are unable to provide assist for mobility and ADL's  "sometimes".  Pt will benefit from increased support upon discharge and further therapy interventions.   Recommendations for follow up therapy are one component of a multi-disciplinary discharge planning process, led by the attending physician.  Recommendations may be updated based on patient status, additional functional criteria and insurance authorization.  Follow Up Recommendations       Assistance Recommended at Discharge Intermittent Supervision/Assistance  Patient can return home with the following A little help with walking and/or transfers;Assistance with cooking/housework;Assist for transportation;Direct supervision/assist for medications management;Help with stairs or ramp for entrance;Direct supervision/assist for financial management;A little help with bathing/dressing/bathroom   Equipment Recommendations       Recommendations for Other Services       Precautions / Restrictions  Precautions Precautions: Fall Restrictions Weight Bearing Restrictions: No     Mobility  Bed Mobility Overal bed mobility: Needs Assistance Bed Mobility: Supine to Sit, Sit to Supine     Supine to sit: Supervision Sit to supine: Supervision   General bed mobility comments: increased time but is able to do with supervision assist today.  cues to reposition in bed for comfort. Patient Response: Cooperative  Transfers Overall transfer level: Needs assistance Equipment used: Rolling walker (2 wheels) Transfers: Sit to/from Stand Sit to Stand: Min assist                Ambulation/Gait Ambulation/Gait assistance: Min assist, Min guard Gait Distance (Feet): 40 Feet Assistive device: Rolling walker (2 wheels) Gait Pattern/deviations: Step-through pattern, Decreased step length - right, Decreased step length - left Gait velocity: decreased     General Gait Details: decreased speed but overall increased distance and speed from prior session   Stairs             Wheelchair Mobility    Modified Rankin (Stroke Patients Only)       Balance Overall balance assessment: Needs assistance Sitting-balance support: Feet supported, No upper extremity supported Sitting balance-Leahy Scale: Fair     Standing balance support: Bilateral upper extremity supported Standing balance-Leahy Scale: Poor Standing balance comment: significant scoliosis that does impact balance and movement quality                            Cognition Arousal/Alertness: Awake/alert Behavior During Therapy: WFL for tasks assessed/performed Overall Cognitive Status: History of cognitive impairments - at baseline  General Comments: Per chart review, pt with h/o dementia at baseline. Pleasant & cooperative, followed commands wells        Exercises      General Comments        Pertinent Vitals/Pain Pain Assessment Pain Assessment:  No/denies pain    Home Living                          Prior Function            PT Goals (current goals can now be found in the care plan section) Progress towards PT goals: Progressing toward goals    Frequency    Min 4X/week      PT Plan Current plan remains appropriate    Co-evaluation              AM-PAC PT "6 Clicks" Mobility   Outcome Measure  Help needed turning from your back to your side while in a flat bed without using bedrails?: A Little Help needed moving from lying on your back to sitting on the side of a flat bed without using bedrails?: A Little Help needed moving to and from a bed to a chair (including a wheelchair)?: A Little Help needed standing up from a chair using your arms (e.g., wheelchair or bedside chair)?: A Little Help needed to walk in hospital room?: A Little Help needed climbing 3-5 steps with a railing? : A Lot 6 Click Score: 17    End of Session Equipment Utilized During Treatment: Gait belt;Oxygen Activity Tolerance: Patient tolerated treatment well Patient left: in bed;with call bell/phone within reach;with bed alarm set Nurse Communication: Mobility status PT Visit Diagnosis: Other abnormalities of gait and mobility (R26.89);Muscle weakness (generalized) (M62.81);Difficulty in walking, not elsewhere classified (R26.2)     Time: 9518-8416 PT Time Calculation (min) (ACUTE ONLY): 12 min  Charges:  $Gait Training: 8-22 mins                   Yurani Germain, PTA 12/04/22, 1:47 PM

## 2022-12-05 ENCOUNTER — Inpatient Hospital Stay: Payer: Medicare HMO

## 2022-12-05 ENCOUNTER — Inpatient Hospital Stay: Payer: Medicare HMO | Admitting: Oncology

## 2022-12-05 DIAGNOSIS — C50919 Malignant neoplasm of unspecified site of unspecified female breast: Secondary | ICD-10-CM

## 2022-12-05 DIAGNOSIS — I2609 Other pulmonary embolism with acute cor pulmonale: Secondary | ICD-10-CM | POA: Diagnosis not present

## 2022-12-05 DIAGNOSIS — J449 Chronic obstructive pulmonary disease, unspecified: Secondary | ICD-10-CM | POA: Diagnosis not present

## 2022-12-05 DIAGNOSIS — J9621 Acute and chronic respiratory failure with hypoxia: Secondary | ICD-10-CM

## 2022-12-05 DIAGNOSIS — E43 Unspecified severe protein-calorie malnutrition: Secondary | ICD-10-CM

## 2022-12-05 LAB — CBC
HCT: 39.4 % (ref 36.0–46.0)
Hemoglobin: 12.3 g/dL (ref 12.0–15.0)
MCH: 30.9 pg (ref 26.0–34.0)
MCHC: 31.2 g/dL (ref 30.0–36.0)
MCV: 99 fL (ref 80.0–100.0)
Platelets: 250 10*3/uL (ref 150–400)
RBC: 3.98 MIL/uL (ref 3.87–5.11)
RDW: 15.2 % (ref 11.5–15.5)
WBC: 12.1 10*3/uL — ABNORMAL HIGH (ref 4.0–10.5)
nRBC: 0 % (ref 0.0–0.2)

## 2022-12-05 MED ORDER — MAGNESIUM OXIDE -MG SUPPLEMENT 400 (240 MG) MG PO TABS: 200.0000 mg | ORAL_TABLET | Freq: Every day | ORAL | Status: DC

## 2022-12-05 MED ORDER — ONDANSETRON HCL 4 MG PO TABS: 4.0000 mg | ORAL_TABLET | Freq: Four times a day (QID) | ORAL | 0 refills | Status: DC | PRN

## 2022-12-05 MED ORDER — ENSURE ENLIVE PO LIQD: 237.0000 mL | Freq: Two times a day (BID) | ORAL | 12 refills | Status: DC

## 2022-12-05 MED ORDER — POLYETHYLENE GLYCOL 3350 17 G PO PACK: 17.0000 g | PACK | Freq: Every day | ORAL | 0 refills | Status: DC

## 2022-12-05 MED ORDER — APIXABAN 5 MG PO TABS: 5.0000 mg | ORAL_TABLET | Freq: Two times a day (BID) | ORAL | Status: DC

## 2022-12-05 MED ORDER — MIDODRINE HCL 10 MG PO TABS: 10.0000 mg | ORAL_TABLET | Freq: Three times a day (TID) | ORAL | Status: DC

## 2022-12-05 MED ORDER — BISACODYL 5 MG PO TBEC: 10.0000 mg | DELAYED_RELEASE_TABLET | Freq: Every day | ORAL | 0 refills | Status: DC | PRN

## 2022-12-05 NOTE — TOC Transition Note (Signed)
Transition of Care [facility]) - CM/SW Discharge Note   Patient Details  Name: Nancy Berg MRN: [id number] Date of Birth: 06-May-1946  [facility] CM/SW Contact:  Allena Katz, LCSW Phone Number: 12/05/2022, 1:02 PM   Clinical Narrative:   [facility] RN given number for report. DC summary sent. Facility notified. RN to call patients family to notify. CSW signing off.     Final next level of care: Skilled Nursing Facility     Patient Goals and CMS Choice CMS Medicare.gov Compare Post Acute Care list provided to:: Patient Represenative (must comment) (Son)    Discharge Placement                         Discharge Plan and Services Additional resources added to the After Visit Summary for                                       Social Determinants of Health (SDOH) Interventions SDOH Screenings   Food Insecurity: No Food Insecurity (11/29/2022)  Housing: Low Risk  (11/29/2022)  Transportation Needs: No Transportation Needs (11/29/2022)  Utilities: Not At Risk (11/29/2022)  Tobacco Use: Medium Risk (11/30/2022)     Readmission Risk Interventions     No data to display

## 2022-12-05 NOTE — Care Management Important Message (Signed)
Important Message  Patient Details  Name: Nancy Berg MRN: [id number] Date of Birth: 1946/05/25   Medicare Important Message Given:  Yes     [name] 12/05/2022, 12:18 PM

## 2022-12-05 NOTE — Progress Notes (Signed)
Occupational Therapy Treatment Patient Details Name: Nancy Berg MRN: [id number] DOB: November 02, 1945 Today's Date: 12/05/2022   History of present illness Nancy Berg is a 77 y.o. 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  Had another episode where she was hypoxic at rest today and was sent to the ER for further evaluation.   OT comments  Upon entering the room, pt supine in bed and reports fatigue but agreeable to OT intervention. Pt having just finished breakfast and taking medication and requesting to brush teeth. Pt performs supine >sit with min A for trunk support. Pt stands with min A and takes several steps with RW to stand at sink for grooming tasks for ~ 7 minutes with min guard for balance. Pt reports felling very fatigue and requesting to return to bed. Sit >supine with min A with call bell and all needed items within reach.    Recommendations for follow up therapy are one component of a multi-disciplinary discharge planning process, led by the attending physician.  Recommendations may be updated based on patient status, additional functional criteria and insurance authorization.    Assistance Recommended at Discharge Frequent or constant Supervision/Assistance  Patient can return home with the following  Assistance with cooking/housework;Assist for transportation;Help with stairs or ramp for entrance;Direct supervision/assist for medications management;Direct supervision/assist for financial management;A little help with walking and/or transfers;A little help with bathing/dressing/bathroom   Equipment Recommendations  Other (comment) (defer to next venue of care)       Precautions / Restrictions Precautions Precautions:  Fall Restrictions Weight Bearing Restrictions: No       Mobility Bed Mobility Overal bed mobility: Needs Assistance Bed Mobility: Supine to Sit, Sit to Supine     Supine to sit: Min assist Sit to supine: Min assist        Transfers Overall transfer level: Needs assistance Equipment used: Rolling walker (2 wheels) Transfers: Sit to/from Stand Sit to Stand: Min assist                 Balance Overall balance assessment: Needs assistance Sitting-balance support: Feet supported, No upper extremity supported Sitting balance-Leahy Scale: Good     Standing balance support: Bilateral upper extremity supported, Reliant on assistive device for balance, During functional activity Standing balance-Leahy Scale: Fair                             ADL either performed or assessed with clinical judgement   ADL Overall ADL's : Needs assistance/impaired     Grooming: Set up;Min guard;Oral care;Wash/dry face;Standing                                      Extremity/Trunk Assessment Upper Extremity Assessment Upper Extremity Assessment: Generalized weakness   Lower Extremity Assessment Lower Extremity Assessment: Generalized weakness        Vision Patient Visual Report: No change from baseline            Cognition Arousal/Alertness: Awake/alert Behavior During Therapy: WFL for tasks assessed/performed Overall Cognitive Status: History of cognitive impairments - at baseline  General Comments: Per chart review, pt with h/o dementia at baseline. Pleasant & cooperative, followed commands well.                   Pertinent Vitals/ Pain       Pain Assessment Pain Assessment: No/denies pain         Frequency  Min 3X/week        Progress Toward Goals  OT Goals(current goals can now be found in the care plan section)  Progress towards OT goals: Progressing toward goals     Plan  Discharge plan remains appropriate;Frequency remains appropriate       AM-PAC OT "6 Clicks" Daily Activity     Outcome Measure   Help from another person eating meals?: A Little Help from another person taking care of personal grooming?: A Little Help from another person toileting, which includes using toliet, bedpan, or urinal?: A Little Help from another person bathing (including washing, rinsing, drying)?: A Lot Help from another person to put on and taking off regular upper body clothing?: A Little Help from another person to put on and taking off regular lower body clothing?: A Lot 6 Click Score: 16    End of Session Equipment Utilized During Treatment: Oxygen  OT Visit Diagnosis: Other abnormalities of gait and mobility (R26.89);Muscle weakness (generalized) (M62.81)   Activity Tolerance Patient tolerated treatment well   Patient Left in bed;with call bell/phone within reach;with bed alarm set;with nursing/sitter in room   Nurse Communication Mobility status        Time: 6045-4098 OT Time Calculation (min): 16 min  Charges: OT General Charges $OT Visit: 1 Visit OT Treatments $Self Care/Home Management : 8-22 mins  Jackquline Denmark, MS, OTR/L , CBIS ascom [phone]  12/05/22, 12:26 PM

## 2022-12-05 NOTE — Discharge Summary (Signed)
Physician Discharge Summary   Patient: Nancy Berg MRN: [id number] DOB: 10-Nov-1945  Admit date:     11/28/2022  Discharge date: 12/05/22  Discharge Physician: Arnetha Courser   PCP: Ellan Lambert, NP   Recommendations at discharge:  Please obtain CBC and BMP within a week Please monitor for any abnormal bleeding especially hematuria as we have restarted Eliquis at a lower dose.  We are not doing loading dose anymore.  Home aspirin was also discontinued.  Patient need to talk with her cardiologist before resuming it. Follow-up with vascular surgery Follow-up with primary care provider  Discharge Diagnoses: Principal Problem:   Pulmonary embolism (HCC) Active Problems:   Acute pulmonary embolism (HCC)   Protein calorie malnutrition (HCC)   Chronic obstructive pulmonary disease, unspecified (HCC)   Breast cancer (HCC)   Type 2 diabetes mellitus without complications (HCC)   Heart failure (HCC)   Protein-calorie malnutrition, severe   Acute on chronic respiratory failure with hypoxia (HCC)  Resolved Problems:   * No resolved hospital problems. 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 She is continue on immunologic therapy for her breast cancer. At the long-term care facility where she resides she was noted to have increased hypoxemia with O2 sats in the 70-80's. Due to persistent hypoxemia she was transported to [facility]ED for evaluation.     ED Course: Afebrile, 119/58  82  RR 18. Chroncally ill, emaciated woman presenting SOB with hypoxemia  on 2L [place] oxygen. EDP exam unremarkable. Lab: VBG 7.45/55/<31, Glucose 111, lactic acid 1.2  WBC 11.8, Hgb 13.2, Plt 245. CXR increased basilar markings c/w atelectasis vs PNA. CTA - PE Left main pulmnary artery extending to left lower lobe with evidence of right heart strain; severe  emphysematous changes. In ED patient started on IV heparin.  Vascular surgery was also consulted due to concern of right heart strain s/p mechanical thrombectomy and thrombolysis, patient tolerated the procedure well.  She was initially treated with heparin infusion followed by Eliquis.  She did develop gross hematuria while taking loading dosing of Eliquis.  We held Eliquis for 1 day with resolution of hematuria.  Decided to stop the loading dose and she was started on 5 mg twice daily and remained stable without any obvious bleeding at the time of discharge. We also stopped her home aspirin to decrease the risk of bleeding.  Patient need to speak with her cardiologist or vascular surgeon regarding restarting aspirin if needed.  She is currently stable at her baseline oxygen requirement of 2 to 3 L.  BNP was mildly elevated at 115.  Echocardiogram was obtained which shows normal EF, grade 1 diastolic dysfunction and moderate pulmonary hypertension. Lower extremity venous Doppler was negative for DVT.  Patient has severe protein caloric malnutrition and appears emaciated.  He has an history of advanced breast cancer for which she will follow-up outpatient with oncology.  She was also found to have significant hypotension and was started on midodrine with improvement in blood pressure.  She will continue with midodrine and follow-up with her providers and they can titrate or take it off as appropriate.  However physical therapist advised going to a rehab where she is being discharged for further management.  Patient will continue on current medications and need to have a close follow-up with her providers for further recommendations.  Assessment and Plan: Acute  pulmonary embolism [facility]) Patient with PE left main pulmonary artery extending to the left lower lobe with right heart strain. Likely etiology is malignance related hypercoagulability. S/p heparin IV infusion, 5/1 started Eliquis 10 mg p.o.  twice daily for 7 days followed by 5 mg p.o. twice daily Continue supplemental O2 inhalation and gradually wean down to 2L baseline TTE LVEF 60 to 65%, grade 1 diastolic dysfunction, moderate pulmonary hypertension Venous duplex lower extremity negative for DVT Eliquis was held on 5/4 when she developed gross hematuria.  Hematuria resolved quickly after stopping Eliquis.  It was resumed back on the evening of 5/5 at 5 mg twice daily.  We were decided to stop that loading dose.  Home aspirin was also discontinued.  Currently no concern of hematuria  Protein calorie malnutrition (HCC) Patient very thin - appears emaciated.  Plan Total protein and prealbumin  RD consult  Heart failure [facility]) Patient has seen Dr. Juliann Pares for pulmonary hypertension and abnormal Echo done 06/03/22 which revealed EF 60-65% and indeterminant diastolic function. Rrecommendation was for continued medical management.   Plan Continue home regimen  Type 2 diabetes mellitus without complications (HCC) Chart reveiwed-serum glucose levels 111 to 140's. Patient on no antiglycemic meds  Plan A1C  Low carb diet  Breast cancer [facility]) Patient s/p mastectomy, adjuvant XRT and a course of carbo-taxol chemotherapy with addition of Keytruda.  Plan Per oncology  Chronic obstructive pulmonary disease, unspecified (HCC) Patient followed by Dr. Mayo Ao, pulmonologist. She has severe emphysema. Last OV 10/14/22 - GOLD stage  III-IV stble on her current regimen and O2 at 2L:Marland Kitchen  Plan Continue home regimen of inhalational meds  Oxygen at 2-4 l [place] to keep sats > 88%  Consult from pulmonary service due to large left main PE with strain       Consultants: Vascular surgery Procedures performed: Mechanical thrombectomy and thrombolysis for PE Disposition: Skilled nursing facility Diet recommendation:  Discharge Diet Orders (From admission, onward)     Start     Ordered   12/05/22 0000  Diet - low sodium heart healthy         12/05/22 1155           Regular diet DISCHARGE MEDICATION: Allergies as of 12/05/2022       Reactions   Penicillins Rash, Other (See Comments)        Medication List     STOP taking these medications    aspirin EC 81 MG tablet   Magnesium 125 MG Caps Replaced by: magnesium oxide 400 (240 Mg) MG tablet   metoprolol succinate 25 MG 24 hr tablet Commonly known as: TOPROL-XL       TAKE these medications    acetaminophen 325 MG tablet Commonly known as: TYLENOL Take 650 mg by mouth See admin instructions. Take 2 tablets (650 mg) by mouth 3 times daily scheduled (0800, 1400 & 2000) & take 2 tablets (650 mg) by mouth up to twice daily as needed for pain.   albuterol 108 (90 Base) MCG/ACT inhaler Commonly known as: VENTOLIN HFA Inhale 2 puffs into the lungs every 6 (six) hours as needed for shortness of breath or wheezing.   albuterol (2.5 MG/3ML) 0.083% nebulizer solution Commonly known as: PROVENTIL Take 2.5 mg by nebulization every 6 (six) hours as needed for wheezing or shortness of breath.   alendronate 70 MG tablet Commonly known as: FOSAMAX Take 70 mg by mouth every Monday.   ammonium lactate 12 % lotion Commonly known as: LAC-HYDRIN Apply 1  application. topically daily. (0800) Lower legs.   apixaban 5 MG Tabs tablet Commonly known as: ELIQUIS Take 1 tablet (5 mg total) by mouth 2 (two) times daily.   atorvastatin 20 MG tablet Commonly known as: LIPITOR Take 20 mg by mouth daily. (0800)   bisacodyl 5 MG EC tablet Commonly known as: DULCOLAX Take 2 tablets (10 mg total) by mouth daily as needed for moderate constipation.   budesonide 0.5 MG/2ML nebulizer solution Commonly known as: PULMICORT Inhale 0.5 mg into the lungs daily. (0800)   calcium carbonate 750 MG chewable tablet Commonly known as: TUMS EX Chew 1 tablet by mouth in the morning and at bedtime. (0800 & 2000)   feeding supplement Liqd Take 237 mLs by mouth 2 (two) times daily between  meals.   guaiFENesin 600 MG 12 hr tablet Commonly known as: MUCINEX Take 600 mg by mouth 2 (two) times daily as needed (congestion.).   HEALTHY EYES SUPERVISION 2 PO Take 1 capsule by mouth in the morning and at bedtime. (0730 & 1730)   Incruse Ellipta 62.5 MCG/ACT Aepb Generic drug: umeclidinium bromide Inhale 1 puff into the lungs daily. (0800)   Lumigan 0.01 % Soln Generic drug: bimatoprost Place 1 drop into both eyes at bedtime. (2000)   magnesium oxide 400 (240 Mg) MG tablet Commonly known as: MAG-OX Take 0.5 tablets (200 mg total) by mouth daily. Replaces: Magnesium 125 MG Caps   midodrine 10 MG tablet Commonly known as: PROAMATINE Take 1 tablet (10 mg total) by mouth 3 (three) times daily with meals.   mirtazapine 15 MG tablet Commonly known as: REMERON Take 15 mg by mouth at bedtime.   ondansetron 4 MG tablet Commonly known as: ZOFRAN Take 1 tablet (4 mg total) by mouth every 6 (six) hours as needed for nausea.   OXYGEN Inhale 2 L/min into the lungs as needed (shortness of breath OR if pulse ox is <90% on room air).   polyethylene glycol 17 g packet Commonly known as: MIRALAX / GLYCOLAX Take 17 g by mouth daily.   potassium chloride SA 20 MEQ tablet Commonly known as: KLOR-CON M Take 20 mEq by mouth daily. (0800)   Vitamin D3 50 MCG (2000 UT) Tabs Take 2,000 Units by mouth daily. (0800)        Follow-up Information     Ellan Lambert, NP. Schedule an appointment as soon as possible for a visit in 1 week(s).   Specialty: Adult Health Nurse Practitioner Contact information: [address] [place] [address] [phone]                Discharge Exam: Ceasar Mons Weights   11/28/22 2300 11/29/22 0410  Weight: Nancy Berg.6 kg Nancy Berg.9 kg   General.  Frail and emaciated elderly lady, in no acute distress. Pulmonary.  Lungs clear bilaterally, normal respiratory effort. CV.  Regular rate and rhythm, no JVD, rub or murmur. Abdomen.  Soft,  nontender, nondistended, BS positive. CNS.  Alert and oriented .  No focal neurologic deficit. Extremities.  No edema, no cyanosis, pulses intact and symmetrical. Psychiatry.  Judgment and insight appears normal.   Condition at discharge: stable  The results of significant diagnostics from this hospitalization (including imaging, microbiology, ancillary and laboratory) are listed below for reference.   Imaging Studies: DG Chest Port 1 View  Result Date: 12/02/2022 CLINICAL DATA:  [id number] SOB (shortness of breath) [id number] EXAM: PORTABLE CHEST 1 VIEW COMPARISON:  Radiograph 11/28/2022 FINDINGS: Unchanged cardiomediastinal silhouette. Unchanged elevated left  hemidiaphragm. Unchanged small effusions and left basilar atelectasis. Emphysema with scattered lung scarring. No new airspace disease. No evidence of pneumothorax. Bones are unchanged. IMPRESSION: Unchanged elevated left hemidiaphragm with small pleural effusions and left basilar atelectasis. No new airspace disease. Electronically Signed   By: Caprice Renshaw M.D.   On: 12/02/2022 08:54   ECHOCARDIOGRAM COMPLETE  Result Date: 12/01/2022    ECHOCARDIOGRAM REPORT   Patient Name:   JEZEL BRISKY Date of Exam: 11/30/2022 Medical Rec #:  [id number]       Height:       61.0 in Accession #:    [id number]      Weight:       85.8 lb Date of Birth:  1946/05/17       BSA:          1.318 m Patient Age:    77 years        BP:           136/48 mmHg Patient Gender: F               HR:           85 bpm. Exam Location:  [facility] Procedure: 2D Echo, Cardiac Doppler and Color Doppler Indications:     I50.21 Acute Systolic Heart Failure  History:         Patient has no prior history of Echocardiogram examinations.                  CHF, COPD; Risk Factors:Dyslipidemia.  Sonographer:     Daphine Deutscher RDCS Referring Phys:  [id number] Gillis Santa Diagnosing Phys: Julien Nordmann MD IMPRESSIONS  1. Left ventricular ejection fraction, by estimation, is 60 to 65%. The left  ventricle has normal function. The left ventricle has no regional wall motion abnormalities. Left ventricular diastolic parameters are consistent with Grade I diastolic dysfunction (impaired relaxation).  2. Right ventricular systolic function is mildly reduced. The right ventricular size is mildly enlarged. There is moderately elevated pulmonary artery systolic pressure.  3. The mitral valve is normal in structure. No evidence of mitral valve regurgitation. No evidence of mitral stenosis.  4. The aortic valve is normal in structure. Aortic valve regurgitation is not visualized. Aortic valve sclerosis is present, with no evidence of aortic valve stenosis.  5. The inferior vena cava is normal in size with greater than 50% respiratory variability, suggesting right atrial pressure of 3 mmHg. FINDINGS  Left Ventricle: Left ventricular ejection fraction, by estimation, is 60 to 65%. The left ventricle has normal function. The left ventricle has no regional wall motion abnormalities. The left ventricular internal cavity size was normal in size. There is  no left ventricular hypertrophy. Left ventricular diastolic parameters are consistent with Grade I diastolic dysfunction (impaired relaxation). Right Ventricle: The right ventricular size is mildly enlarged. No increase in right ventricular wall thickness. Right ventricular systolic function is mildly reduced. There is moderately elevated pulmonary artery systolic pressure. The tricuspid regurgitant velocity is 3.58 m/s, and with an assumed right atrial pressure of 5 mmHg, the estimated right ventricular systolic pressure is 56.3 mmHg. Left Atrium: Left atrial size was normal in size. Right Atrium: Right atrial size was normal in size. Pericardium: There is no evidence of pericardial effusion. Mitral Valve: The mitral valve is normal in structure. No evidence of mitral valve regurgitation. No evidence of mitral valve stenosis. Tricuspid Valve: The tricuspid valve is  normal in structure. Tricuspid valve regurgitation is mild . No evidence of tricuspid  stenosis. Aortic Valve: The aortic valve is normal in structure. Aortic valve regurgitation is not visualized. Aortic valve sclerosis is present, with no evidence of aortic valve stenosis. Pulmonic Valve: The pulmonic valve was normal in structure. Pulmonic valve regurgitation is not visualized. No evidence of pulmonic stenosis. Aorta: The aortic root is normal in size and structure. Venous: The inferior vena cava is normal in size with greater than 50% respiratory variability, suggesting right atrial pressure of 3 mmHg. IAS/Shunts: No atrial level shunt detected by color flow Doppler.  LEFT VENTRICLE PLAX 2D LVIDd:         2.10 cm Diastology LVIDs:         1.60 cm LV e' medial:    9.57 cm/s LV PW:         0.60 cm LV E/e' medial:  5.1 LV IVS:        0.60 cm LV e' lateral:   12.90 cm/s                        LV E/e' lateral: 3.8  RIGHT VENTRICLE            IVC RV Basal diam:  3.20 cm    IVC diam: 1.10 cm RV S prime:     9.17 cm/s TAPSE (M-mode): 1.4 cm LEFT ATRIUM             Index        RIGHT ATRIUM          Index LA diam:        2.80 cm 2.12 cm/m   RA Area:     8.27 cm LA Vol (A2C):   24.2 ml 18.36 ml/m  RA Volume:   15.00 ml 11.Nancy Berg ml/m LA Vol (A4C):   16.7 ml 12.67 ml/m LA Biplane Vol: 21.8 ml 16.54 ml/m  AORTIC VALVE LVOT Vmax:   115.33 cm/s LVOT Vmean:  77.300 cm/s LVOT VTI:    0.200 m  AORTA Ao Root diam: 3.10 cm MITRAL VALVE               TRICUSPID VALVE MV Area (PHT): 3.95 cm    TR Peak grad:   51.3 mmHg MV Decel Time: 192 msec    TR Vmax:        358.00 cm/s MV E velocity: 48.50 cm/s MV A velocity: 77.05 cm/s  SHUNTS MV E/A ratio:  0.63        Systemic VTI: 0.20 m Julien Nordmann MD Electronically signed by Julien Nordmann MD Signature Date/Time: 12/01/2022/11:53:16 AM    Final    US Venous Img Lower Bilateral (DVT)  Result Date: 11/30/2022 CLINICAL DATA:  Pulmonary embolism.  Evaluate for residual DVT. EXAM:  BILATERAL LOWER EXTREMITY VENOUS DOPPLER ULTRASOUND TECHNIQUE: Gray-scale sonography with graded compression, as well as color Doppler and duplex ultrasound were performed to evaluate the lower extremity deep venous systems from the level of the common femoral vein and including the common femoral, femoral, profunda femoral, popliteal and calf veins including the posterior tibial, peroneal and gastrocnemius veins when visible. The superficial great saphenous vein was also interrogated. Spectral Doppler was utilized to evaluate flow at rest and with distal augmentation maneuvers in the common femoral, femoral and popliteal veins. COMPARISON:  None Available. FINDINGS: RIGHT LOWER EXTREMITY Common Femoral Vein: No evidence of thrombus. Normal compressibility, respiratory phasicity and response to augmentation. Saphenofemoral Junction: No evidence of thrombus. Normal compressibility and flow on color Doppler imaging. Profunda Femoral Vein:  No evidence of thrombus. Normal compressibility and flow on color Doppler imaging. Femoral Vein: No evidence of thrombus. Normal compressibility, respiratory phasicity and response to augmentation. Popliteal Vein: No evidence of thrombus. Normal compressibility, respiratory phasicity and response to augmentation. Calf Veins: No evidence of thrombus. Normal compressibility and flow on color Doppler imaging. Superficial Great Saphenous Vein: No evidence of thrombus. Normal compressibility. Venous Reflux:  None. Other Findings:  None. LEFT LOWER EXTREMITY Common Femoral Vein: No evidence of thrombus. Normal compressibility, respiratory phasicity and response to augmentation. Saphenofemoral Junction: No evidence of thrombus. Normal compressibility and flow on color Doppler imaging. Profunda Femoral Vein: No evidence of thrombus. Normal compressibility and flow on color Doppler imaging. Femoral Vein: No evidence of thrombus. Normal compressibility, respiratory phasicity and response to  augmentation. Popliteal Vein: No evidence of thrombus. Normal compressibility, respiratory phasicity and response to augmentation. Calf Veins: No evidence of thrombus. Normal compressibility and flow on color Doppler imaging. Superficial Great Saphenous Vein: No evidence of thrombus. Normal compressibility. Venous Reflux:  None. Other Findings:  None. IMPRESSION: No evidence of deep venous thrombosis in either lower extremity. Electronically Signed   By: Malachy Moan M.D.   On: 11/30/2022 10:29   PERIPHERAL VASCULAR CATHETERIZATION  Result Date: 11/29/2022 See surgical note for result.  CT Angio Chest PE W and/or Wo Contrast  Addendum Date: 11/28/2022   ADDENDUM REPORT: 11/28/2022 23:49 ADDENDUM: These results were called by telephone at the time of interpretation on 11/28/2022 at 11:25 p.m. to provider Willy Eddy , who verbally acknowledged these results. Electronically Signed   By: Darliss Cheney M.D.   On: 11/28/2022 23:49   Result Date: 11/28/2022 CLINICAL DATA:  High probability for PE.  Shortness of breath. EXAM: CT ANGIOGRAPHY CHEST WITH CONTRAST TECHNIQUE: Multidetector CT imaging of the chest was performed using the standard protocol during bolus administration of intravenous contrast. Multiplanar CT image reconstructions and MIPs were obtained to evaluate the vascular anatomy. RADIATION DOSE REDUCTION: This exam was performed according to the departmental dose-optimization program which includes automated exposure control, adjustment of the mA and/or kV according to patient size and/or use of iterative reconstruction technique. CONTRAST:  75mL OMNIPAQUE IOHEXOL 350 MG/ML SOLN COMPARISON:  Chest CT 06/30/2022.  Chest x-ray same day. FINDINGS: Cardiovascular: Satisfactory opacification of the pulmonary arteries to the segmental level. There is pulmonary embolism in the left main pulmonary artery extending into left lower lobar branch. Normal heart size. No pericardial effusion. There are  atherosclerotic calcifications of the aorta and coronary arteries. Mediastinum/Nodes: No enlarged mediastinal, hilar, or axillary lymph nodes. Thyroid gland, trachea, and esophagus demonstrate no significant findings. Lungs/Pleura: Severe emphysematous changes are again seen there are small bilateral pleural effusions. There is chronic appearing atelectasis in the left lower lobe. This is unchanged from prior. Upper Abdomen: No acute abnormality. Musculoskeletal: No chest wall abnormality. No acute or significant osseous findings. Review of the MIP images confirms the above findings. IMPRESSION: 1. Pulmonary embolism in the left main pulmonary artery extending into the left lower lobar branch. Positive for acute PE with CTevidence of right heart strain (RV/LV Ratio = 1.36) consistent with at least submassive (intermediate risk) PE. The presence of right heart strain has been associated with an increased risk of morbidity and mortality. 2. Small bilateral pleural effusions. 3. Stable chronic appearing atelectasis in the left lower lobe. Aortic Atherosclerosis (ICD10-I70.0). Electronically Signed: By: Darliss Cheney M.D. On: 11/28/2022 23:24   DG Chest 2 View  Result Date: 11/28/2022 CLINICAL DATA:  Shortness of  breath, hypoxia EXAM: CHEST - 2 VIEW COMPARISON:  11/25/2021 FINDINGS: Cardiac size is within normal limits. Left hemidiaphragm is elevated which may be due to eventration or paralysis. Linear densities in the left lower lung field may be related to elevation of left hemidiaphragm. There is blunting of both lateral CP angles. Small linear densities are seen in right lower lung field. There is poor inspiration. There is prominence of both hilar regions. There is no pneumothorax. IMPRESSION: There are no signs of pulmonary edema or focal pulmonary consolidation. Increased markings are seen in both lower lung fields which may be due to poor inspiration or suggest atelectasis/pneumonia. There is blunting of  both lateral CP angles suggesting small bilateral pleural effusions. Both hilar regions are prominent. This may be due to pulmonary arterial hypertension or suggest lymphadenopathy. Short-term follow-up chest radiographs along with CT if warranted may be considered. Electronically Signed   By: Ernie Avena M.D.   On: 11/28/2022 15:26    Microbiology: No results found for this or any previous visit.  Labs: CBC: Recent Labs  Lab 12/01/22 0500 12/02/22 0604 12/03/22 0522 12/04/22 0616 12/05/22 0914  WBC 11.7* 13.1* 13.0* 11.4* 12.1*  HGB 11.7* 12.5 12.2 11.9* 12.3  HCT 37.6 39.7 Nancy Berg.7 Nancy Berg.3 39.4  MCV 100.8* 99.5 99.0 99.2 99.0  PLT 211 238 236 246 250   Basic Metabolic Panel: Recent Labs  Lab 11/30/22 0055 12/01/22 0500 12/02/22 0604 12/03/22 0522 12/04/22 0457  NA 141 140 138 136 137  K 3.6 4.0 3.7 4.0 4.2  CL 102 101 96* 97* 101  CO2 32 32 33* 31 26  GLUCOSE 109* 92 91 98 82  BUN 24* 23 24* 23 26*  CREATININE 0.55 0.35* 0.52 0.43* 0.44  CALCIUM 8.7* 8.8* 8.6* 8.9 9.1  MG 1.7 1.7 1.7  --   --   PHOS 3.9 3.8 3.5  --   --    Liver Function Tests: Recent Labs  Lab 11/28/22 1451  PROT 6.4*   CBG: No results for input(s): "GLUCAP" in the last 168 hours.  Discharge time spent: greater than 30 minutes.  This record has been created using Conservation officer, historic buildings. Errors have been sought and corrected,but may not always be located. Such creation errors do not reflect on the standard of care.   Signed: Arnetha Courser, MD [facility] 12/05/2022

## 2022-12-05 NOTE — Progress Notes (Signed)
Patient report given to RN Soumy. All questions are addressed via phone.

## 2022-12-05 NOTE — Progress Notes (Signed)
Physical Therapy Treatment Patient Details Name: Nancy Berg MRN: [id number] DOB: [birth year] Today's Date: 12/05/2022   History of Present Illness Nancy Berg is a 77 y.o. 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  Had another episode where she was hypoxic at rest today and was sent to the ER for further evaluation.    PT Comments    Pt ready for session.  Min a for bed mobility today.  She does try but asks for help.  Sitting with supervision.  She is able to stand to RW and progress gait 70' in hallway with RW and overall improved quality and speed today.  Does c/o some knee pain from arthritis.  Pt to BSC to void then for bathing as she had jelly down her gown from breakfast.  She is able to assist for upper body but assist given as needed.  Returned to supine per her request after session with RN in room for medications.   Recommendations for follow up therapy are one component of a multi-disciplinary discharge planning process, led by the attending physician.  Recommendations may be updated based on patient status, additional functional criteria and insurance authorization.  Follow Up Recommendations       Assistance Recommended at Discharge Intermittent Supervision/Assistance  Patient can return home with the following A little help with walking and/or transfers;Assistance with cooking/housework;Assist for transportation;Help with stairs or ramp for entrance;A little help with bathing/dressing/bathroom   Equipment Recommendations       Recommendations for Other Services       Precautions / Restrictions Precautions Precautions: Fall Restrictions Weight Bearing Restrictions: No     Mobility  Bed Mobility Overal bed  mobility: Needs Assistance Bed Mobility: Supine to Sit, Sit to Supine     Supine to sit: Min assist Sit to supine: Min assist   General bed mobility comments: some increased help today Patient Response: Cooperative  Transfers Overall transfer level: Needs assistance Equipment used: Rolling walker (2 wheels) Transfers: Sit to/from Stand Sit to Stand: Min assist                Ambulation/Gait Ambulation/Gait assistance: Min assist, Min guard Gait Distance (Feet): 60 Feet Assistive device: Rolling walker (2 wheels) Gait Pattern/deviations: Step-through pattern, Decreased step length - right, Decreased step length - left Gait velocity: decreased     General Gait Details: slight increase in distance and quality today   Stairs             Wheelchair Mobility    Modified Rankin (Stroke Patients Only)       Balance Overall balance assessment: Needs assistance Sitting-balance support: Feet supported, No upper extremity supported Sitting balance-Leahy Scale: Good     Standing balance support: Bilateral upper extremity supported Standing balance-Leahy Scale: Fair                              Cognition Arousal/Alertness: Awake/alert Behavior During Therapy: WFL for tasks assessed/performed Overall Cognitive Status: History of cognitive impairments - at baseline  Exercises Other Exercises Other Exercises: bathing with pt assisting as appropriate    General Comments        Pertinent Vitals/Pain Pain Assessment Pain Assessment: No/denies pain    Home Living                          Prior Function            PT Goals (current goals can now be found in the care plan section) Progress towards PT goals: Progressing toward goals    Frequency    Min 4X/week      PT Plan Current plan remains appropriate    Co-evaluation              AM-PAC PT "6 Clicks"  Mobility   Outcome Measure  Help needed turning from your back to your side while in a flat bed without using bedrails?: A Little Help needed moving from lying on your back to sitting on the side of a flat bed without using bedrails?: A Little Help needed moving to and from a bed to a chair (including a wheelchair)?: A Little Help needed standing up from a chair using your arms (e.g., wheelchair or bedside chair)?: A Little Help needed to walk in hospital room?: A Little Help needed climbing 3-5 steps with a railing? : A Lot 6 Click Score: 17    End of Session Equipment Utilized During Treatment: Gait belt;Oxygen Activity Tolerance: Patient tolerated treatment well Patient left: in bed;with call bell/phone within reach;with bed alarm set;with nursing/sitter in room Nurse Communication: Mobility status PT Visit Diagnosis: Other abnormalities of gait and mobility (R26.89);Muscle weakness (generalized) (M62.81);Difficulty in walking, not elsewhere classified (R26.2)     Time: 4098-1191 PT Time Calculation (min) (ACUTE ONLY): 15 min  Charges:  $Gait Training: 8-22 mins                   Lorrae Haywood, PTA 12/05/22, 10:40 AM

## 2022-12-12 ENCOUNTER — Ambulatory Visit (INDEPENDENT_AMBULATORY_CARE_PROVIDER_SITE_OTHER): Payer: Medicare HMO | Admitting: Nurse Practitioner

## 2022-12-12 VITALS — BP 110/70 | HR 93 | Resp 17

## 2022-12-12 DIAGNOSIS — I2609 Other pulmonary embolism with acute cor pulmonale: Secondary | ICD-10-CM | POA: Diagnosis not present

## 2022-12-12 DIAGNOSIS — E119 Type 2 diabetes mellitus without complications: Secondary | ICD-10-CM

## 2022-12-27 ENCOUNTER — Encounter (INDEPENDENT_AMBULATORY_CARE_PROVIDER_SITE_OTHER): Payer: Self-pay | Admitting: Nurse Practitioner

## 2022-12-27 NOTE — Progress Notes (Unsigned)
Subjective:    Patient ID: Nancy Berg, female    DOB: 1946/04/14, 77 y.o.   MRN: [id number] Chief Complaint  Patient presents with   Follow-up    The patient returns today for follow-up after intervention on 12/02/2022 including:  Procedure(s) Performed:             1.  Contrast injection right heart             2.  Mechanical thrombectomy left main and lower lobe pulmonary arteries using the penumbra CAT 12 catheter             3.  Selective catheter placement left upper lobe and lower lobe pulmonary arteries             4.  Selective catheter placement right main pulmonary artery  Following thrombectomy she is much improved.  She has been tolerating her Eliquis well.  She denies any chest pain or significant shortness of breath.    Review of Systems  All other systems reviewed and are negative.      Objective:   Physical Exam Vitals reviewed.  Cardiovascular:     Rate and Rhythm: Normal rate.  Pulmonary:     Effort: Pulmonary effort is normal.  Skin:    General: Skin is warm and dry.  Neurological:     Mental Status: She is alert and oriented to person, place, and time.  Psychiatric:        Mood and Affect: Mood normal.        Behavior: Behavior normal.        Thought Content: Thought content normal.        Judgment: Judgment normal.     BP 110/70 (BP Location: Left Arm)   Pulse 93   Resp 17   Past Medical History:  Diagnosis Date   Arthritis    Cancer (HCC)    CHF (congestive heart failure) (HCC)    COPD (chronic obstructive pulmonary disease) (HCC)    Dementia (HCC)    Hyperlipidemia     Social History   Socioeconomic History   Marital status: Divorced    Spouse name: Not on file   Number of children: Not on file   Years of education: Not on file   Highest education level: Not on file  Occupational History   Not on file  Tobacco Use   Smoking status: Former    Types: Cigarettes    Quit date: 10/2019    Years since quitting: 3.1     Passive exposure: Past   Smokeless tobacco: Never  Vaping Use   Vaping Use: Never used  Substance and Sexual Activity   Alcohol use: Not Currently   Drug use: Not Currently   Sexual activity: Not Currently  Other Topics Concern   Not on file  Social History Narrative   Not on file   Social Determinants of Health   Financial Resource Strain: Not on file  Food Insecurity: No Food Insecurity (11/29/2022)   Hunger Vital Sign    Worried About Running Out of Food in the Last Year: Never true    Ran Out of Food in the Last Year: Never true  Transportation Needs: No Transportation Needs (11/29/2022)   PRAPARE - Administrator, Civil Service (Medical): No    Lack of Transportation (Non-Medical): No  Physical Activity: Not on file  Stress: Not on file  Social Connections: Not on file  Intimate Partner Violence: Not At Risk (11/29/2022)  Humiliation, Afraid, Rape, and Kick questionnaire    Fear of Current or Ex-Partner: No    Emotionally Abused: No    Physically Abused: No    Sexually Abused: No    Past Surgical History:  Procedure Laterality Date   BREAST BIOPSY Right 10/14/2021   rt 12:00 [place] bx venus clip path pending   BREAST BIOPSY Right 10/14/2021   rt axilla hydromarker path pending   EYE SURGERY Bilateral 2019   cataract   MASTECTOMY MODIFIED RADICAL Right 12/28/2021   Procedure: MASTECTOMY MODIFIED RADICAL;  Surgeon: Carolan Shiver, MD;  Location: [facility];  Service: General;  Laterality: Right;   PORTACATH PLACEMENT N/A 11/25/2021   Procedure: INSERTION PORT-A-CATH;  Surgeon: Carolan Shiver, MD;  Location: [facility];  Service: General;  Laterality: N/A;   PULMONARY THROMBECTOMY N/A 11/29/2022   Procedure: PULMONARY THROMBECTOMY;  Surgeon: Annice Needy, MD;  Location: [facility];  Service: Cardiovascular;  Laterality: N/A;    Family History  Problem Relation Age of Onset   Breast cancer Mother        dx 32s   Lung cancer Sister 85     Allergies  Allergen Reactions   Penicillins Rash and Other (See Comments)       Latest Ref Rng & Units 12/05/2022    9:14 AM 12/04/2022    6:16 AM 12/03/2022    5:22 AM  CBC  WBC 4.0 - 10.5 K/uL 12.1  11.4  13.0   Hemoglobin 12.0 - 15.0 g/dL 16.1  09.6  04.5   Hematocrit 36.0 - 46.0 % 39.4  38.3  38.7   Platelets 150 - 400 K/uL 250  246  236       CMP     Component Value Date/Time   NA 137 12/04/2022 0457   K 4.2 12/04/2022 0457   CL 101 12/04/2022 0457   CO2 26 12/04/2022 0457   GLUCOSE 82 12/04/2022 0457   BUN 26 (H) 12/04/2022 0457   CREATININE 0.44 12/04/2022 0457   CALCIUM 9.1 12/04/2022 0457   PROT 6.4 (L) 11/28/2022 1451   ALBUMIN 3.7 08/30/2022 1304   AST 23 08/30/2022 1304   ALT 12 08/30/2022 1304   ALKPHOS 78 08/30/2022 1304   BILITOT 0.5 08/30/2022 1304   GFRNONAA >60 12/04/2022 0457     No results found.     Assessment & Plan:   1. Acute pulmonary embolism with acute cor pulmonale, unspecified pulmonary embolism type (HCC) The hematuria has not resumed.  Recommend that generally the excuse patient will remain on anticoagulation for at least 1 year.  Patient is currently working with hematology due to her history of breast cancer and so we will allow the patient to be followed by hematology for ongoing management of her Eliquis.  2. Type 2 diabetes mellitus without complication, unspecified whether long term insulin use (HCC) Continue hypoglycemic medications as already ordered, these medications have been reviewed and there are no changes at this time.  Hgb A1C to be monitored as already arranged by primary service   Current Outpatient Medications on File Prior to Visit  Medication Sig Dispense Refill   acetaminophen (TYLENOL) 325 MG tablet Take 650 mg by mouth See admin instructions. Take 2 tablets (650 mg) by mouth 3 times daily scheduled (0800, 1400 & 2000) & take 2 tablets (650 mg) by mouth up to twice daily as needed for pain.     albuterol  (PROVENTIL) (2.5 MG/3ML) 0.083% nebulizer solution Take 2.5 mg by  nebulization every 6 (six) hours as needed for wheezing or shortness of breath.     albuterol (VENTOLIN HFA) 108 (90 Base) MCG/ACT inhaler Inhale 2 puffs into the lungs every 6 (six) hours as needed for shortness of breath or wheezing.     alendronate (FOSAMAX) 70 MG tablet Take 70 mg by mouth every Monday.     ammonium lactate (LAC-HYDRIN) 12 % lotion Apply 1 application. topically daily. (0800) Lower legs.     apixaban (ELIQUIS) 5 MG TABS tablet Take 1 tablet (5 mg total) by mouth 2 (two) times daily. 60 tablet    atorvastatin (LIPITOR) 20 MG tablet Take 20 mg by mouth daily. (0800)     bisacodyl (DULCOLAX) 5 MG EC tablet Take 2 tablets (10 mg total) by mouth daily as needed for moderate constipation. 30 tablet 0   calcium carbonate (TUMS EX) 750 MG chewable tablet Chew 1 tablet by mouth in the morning and at bedtime. (0800 & 2000)     Cholecalciferol (VITAMIN D3) 50 MCG (2000 UT) TABS Take 2,000 Units by mouth daily. (0800)     guaiFENesin (MUCINEX) 600 MG 12 hr tablet Take 600 mg by mouth 2 (two) times daily as needed (congestion.).     INCRUSE ELLIPTA 62.5 MCG/ACT AEPB Inhale 1 puff into the lungs daily. (0800)     LUMIGAN 0.01 % SOLN Place 1 drop into both eyes at bedtime. (2000)     magnesium oxide (MAG-OX) 400 (240 Mg) MG tablet Take 0.5 tablets (200 mg total) by mouth daily.     midodrine (PROAMATINE) 10 MG tablet Take 1 tablet (10 mg total) by mouth 3 (three) times daily with meals.     mirtazapine (REMERON) 15 MG tablet Take 15 mg by mouth at bedtime.     Multiple Vitamins-Minerals (HEALTHY EYES SUPERVISION 2 PO) Take 1 capsule by mouth in the morning and at bedtime. (0730 & 1730)     OXYGEN Inhale 2 L/min into the lungs as needed (shortness of breath OR if pulse ox is <90% on room air).     polyethylene glycol (MIRALAX / GLYCOLAX) 17 g packet Take 17 g by mouth daily. 14 each 0   potassium chloride SA (KLOR-CON M) 20 MEQ  tablet Take 20 mEq by mouth daily. (0800)     budesonide (PULMICORT) 0.5 MG/2ML nebulizer solution Inhale 0.5 mg into the lungs daily. (0800)     feeding supplement (ENSURE ENLIVE / ENSURE PLUS) LIQD Take 237 mLs by mouth 2 (two) times daily between meals. (Patient not taking: Reported on 12/12/2022) 237 mL 12   ondansetron (ZOFRAN) 4 MG tablet Take 1 tablet (4 mg total) by mouth every 6 (six) hours as needed for nausea. (Patient not taking: Reported on 12/12/2022) 20 tablet 0   [DISCONTINUED] prochlorperazine (COMPAZINE) 10 MG tablet Take 1 tablet (10 mg total) by mouth every 6 (six) hours as needed (Nausea or vomiting). (Patient not taking: Reported on 03/07/2022) 30 tablet 1   Current Facility-Administered Medications on File Prior to Visit  Medication Dose Route Frequency Provider Last Rate Last Admin   diphenhydrAMINE (BENADRYL) 50 MG/ML injection            famotidine (PEPCID) 20-0.9 MG/50ML-% IVPB             There are no Patient Instructions on file for this visit. No follow-ups on file.   Georgiana Spinner, NP

## 2023-01-03 ENCOUNTER — Encounter: Payer: Self-pay | Admitting: Oncology

## 2023-01-03 ENCOUNTER — Inpatient Hospital Stay: Payer: Medicare HMO | Attending: Oncology

## 2023-01-03 ENCOUNTER — Inpatient Hospital Stay (HOSPITAL_BASED_OUTPATIENT_CLINIC_OR_DEPARTMENT_OTHER): Payer: Medicare HMO | Admitting: Oncology

## 2023-01-03 VITALS — BP 128/65 | HR 73 | Temp 98.9°F | Resp 18 | Ht 61.0 in | Wt 83.0 lb

## 2023-01-03 DIAGNOSIS — I2699 Other pulmonary embolism without acute cor pulmonale: Secondary | ICD-10-CM

## 2023-01-03 DIAGNOSIS — Z9221 Personal history of antineoplastic chemotherapy: Secondary | ICD-10-CM | POA: Diagnosis not present

## 2023-01-03 DIAGNOSIS — Z87891 Personal history of nicotine dependence: Secondary | ICD-10-CM | POA: Diagnosis not present

## 2023-01-03 DIAGNOSIS — Z86711 Personal history of pulmonary embolism: Secondary | ICD-10-CM | POA: Diagnosis present

## 2023-01-03 DIAGNOSIS — Z08 Encounter for follow-up examination after completed treatment for malignant neoplasm: Secondary | ICD-10-CM

## 2023-01-03 DIAGNOSIS — Z853 Personal history of malignant neoplasm of breast: Secondary | ICD-10-CM | POA: Diagnosis present

## 2023-01-03 DIAGNOSIS — Z79899 Other long term (current) drug therapy: Secondary | ICD-10-CM | POA: Diagnosis not present

## 2023-01-03 DIAGNOSIS — Z171 Estrogen receptor negative status [ER-]: Secondary | ICD-10-CM

## 2023-01-03 DIAGNOSIS — Z923 Personal history of irradiation: Secondary | ICD-10-CM | POA: Diagnosis not present

## 2023-01-03 DIAGNOSIS — Z7901 Long term (current) use of anticoagulants: Secondary | ICD-10-CM | POA: Insufficient documentation

## 2023-01-03 LAB — CBC WITH DIFFERENTIAL/PLATELET
Abs Immature Granulocytes: 0.08 10*3/uL — ABNORMAL HIGH (ref 0.00–0.07)
Basophils Absolute: 0.1 10*3/uL (ref 0.0–0.1)
Basophils Relative: 1 %
Eosinophils Absolute: 0.5 10*3/uL (ref 0.0–0.5)
Eosinophils Relative: 5 %
HCT: 41.1 % (ref 36.0–46.0)
Hemoglobin: 12.8 g/dL (ref 12.0–15.0)
Immature Granulocytes: 1 %
Lymphocytes Relative: 12 %
Lymphs Abs: 1.2 10*3/uL (ref 0.7–4.0)
MCH: 31.4 pg (ref 26.0–34.0)
MCHC: 31.1 g/dL (ref 30.0–36.0)
MCV: 101 fL — ABNORMAL HIGH (ref 80.0–100.0)
Monocytes Absolute: 1.2 10*3/uL — ABNORMAL HIGH (ref 0.1–1.0)
Monocytes Relative: 11 %
Neutro Abs: 7.4 10*3/uL (ref 1.7–7.7)
Neutrophils Relative %: 70 %
Platelets: 259 10*3/uL (ref 150–400)
RBC: 4.07 MIL/uL (ref 3.87–5.11)
RDW: 14.5 % (ref 11.5–15.5)
WBC: 10.4 10*3/uL (ref 4.0–10.5)
nRBC: 0 % (ref 0.0–0.2)

## 2023-01-03 LAB — COMPREHENSIVE METABOLIC PANEL
ALT: 13 U/L (ref 0–44)
AST: 21 U/L (ref 15–41)
Albumin: 3.6 g/dL (ref 3.5–5.0)
Alkaline Phosphatase: 62 U/L (ref 38–126)
Anion gap: 9 (ref 5–15)
BUN: 20 mg/dL (ref 8–23)
CO2: 25 mmol/L (ref 22–32)
Calcium: 9.3 mg/dL (ref 8.9–10.3)
Chloride: 100 mmol/L (ref 98–111)
Creatinine, Ser: 0.62 mg/dL (ref 0.44–1.00)
GFR, Estimated: >60 mL/min (ref >60)
Glucose, Bld: 88 mg/dL (ref 70–99)
Potassium: 3.7 mmol/L (ref 3.5–5.1)
Sodium: 134 mmol/L — ABNORMAL LOW (ref 135–145)
Total Bilirubin: 0.3 mg/dL (ref 0.3–1.2)
Total Protein: 6.8 g/dL (ref 6.5–8.1)

## 2023-01-04 ENCOUNTER — Other Ambulatory Visit: Payer: Self-pay

## 2023-01-06 ENCOUNTER — Encounter: Payer: Self-pay | Admitting: Oncology

## 2023-01-06 NOTE — Progress Notes (Signed)
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  That incidentally showed right axillary adenopathy and soft tissue in the superior right breast.  This was followed by diagnostic right breast mammogram and ultrasound which showed a 2.3 x 1.6 x 1.2 cm irregular hypoechoic mass in the right breast at the 12 o'clock position 1 cm from the nipple.  Enlarged right axillary lymph node 1.8 cm.  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  Patient has oxygen dependent COPD.     PET CT scan shows FDG avid spiculated nodule with central cavitation in the left upper lobe.  Size an SUV of this nodule was not quantified on the PET scan.  Partially calcified markedly avid right breast lesion with markedly avid right axillary lymph node metastases.  Patient underwent 1 cycle of Taxol chemotherapy while awaiting surgery and underwent final surgery on 12/28/2021.  Final pathology showed18 mm grade 3 invasive mammary carcinoma triple negative. Macrometastases and at least for an micrometastases in 0 lymph nodes.  Largest size of metastatic deposit 30 mm.  Extranodal extension present.  pT1C pN2A   Given age and frailty plan is to proceed with CarboTaxol chemotherapy  alone for 12 cycles.  Rande Lawman was added with cycle 12 of CarboTaxol and plan is to continue 9 cycles of adjuvant Keytruda.  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  This does not increase the risk of breast cancer.  These patients are not affected by MUTYH associated polyposis.   Patient admitted to the hospital in April 2024 for symptoms of hypoxia.  Interval history- She was found to have acute pulmonary embolism involving the left main pulmonary artery with right heart strain.  Bilateral lower extremity Dopplers negative.  Patient underwent mechanical thrombectomy and was put on Eliquis.  ECOG PS- 3 Pain scale- 0   Review of systems- Review of Systems  Constitutional:  Positive for malaise/fatigue. Negative for chills, fever and weight loss.  HENT:  Negative for congestion, ear discharge and nosebleeds.   Eyes:  Negative for blurred vision.  Respiratory:  Positive for shortness of breath. Negative for cough, hemoptysis, sputum production and wheezing.   Cardiovascular:  Negative for chest pain, palpitations, orthopnea and claudication.  Gastrointestinal:  Negative for abdominal pain, blood in stool, constipation, diarrhea, heartburn, melena, nausea and vomiting.  Genitourinary:  Negative for dysuria, flank pain, frequency, hematuria and urgency.  Musculoskeletal:  Negative for back pain, joint pain and myalgias.  Skin:  Negative for rash.  Neurological:  Negative for dizziness, tingling, focal weakness, seizures, weakness and headaches.  Endo/Heme/Allergies:  Does not bruise/bleed easily.  Psychiatric/Behavioral:  Negative for depression and suicidal ideas. The patient does not have insomnia.  Allergies  Allergen Reactions   Penicillins Rash and Other (See Comments)     Past Medical History:  Diagnosis Date   Arthritis    Cancer (HCC)    CHF (congestive heart  failure) (HCC)    COPD (chronic obstructive pulmonary disease) (HCC)    Dementia (HCC)    Hyperlipidemia      Past Surgical History:  Procedure Laterality Date   BREAST BIOPSY Right 10/14/2021   rt 12:00 [place] bx venus clip path pending   BREAST BIOPSY Right 10/14/2021   rt axilla hydromarker path pending   EYE SURGERY Bilateral 2019   cataract   MASTECTOMY MODIFIED RADICAL Right 12/28/2021   Procedure: MASTECTOMY MODIFIED RADICAL;  Surgeon: Carolan Shiver, MD;  Location: [facility] ORS;  Service: General;  Laterality: Right;   PORTACATH PLACEMENT N/A 11/25/2021   Procedure: INSERTION PORT-A-CATH;  Surgeon: Carolan Shiver, MD;  Location: [facility] OR[facility];  Service: General;  Laterality: N/A;   PULMONARY THROMBECTOMY N/A 11/29/2022   Procedure: PULMONARY THROMBECTOMY;  Surgeon: Annice Needy, MD;  Location: [facility];  Service: Cardiovascular;  Laterality: N/A;    Social History   Socioeconomic History   Marital status: Divorced    Spouse name: Not on file   Number of children: Not on file   Years of education: Not on file   Highest education level: Not on file  Occupational History   Not on file  Tobacco Use   Smoking status: Former    Types: Cigarettes    Quit date: 10/2019    Years since quitting: 3.1    Passive exposure: Past   Smokeless tobacco: Never  Vaping Use   Vaping Use: Never used  Substance and Sexual Activity   Alcohol use: Not Currently   Drug use: Not Currently   Sexual activity: Not Currently  Other Topics Concern   Not on file  Social History Narrative   Not on file   Social Determinants of Health   Financial Resource Strain: Not on file  Food Insecurity: No Food Insecurity (11/29/2022)   Hunger Vital Sign    Worried About Running Out of Food in the Last Year: Never true    Ran Out of Food in the Last Year: Never true  Transportation Needs: No Transportation Needs (11/29/2022)   PRAPARE - Administrator, Civil Service  (Medical): No    Lack of Transportation (Non-Medical): No  Physical Activity: Not on file  Stress: Not on file  Social Connections: Not on file  Intimate Partner Violence: Not At Risk (11/29/2022)   Humiliation, Afraid, Rape, and Kick questionnaire    Fear of Current or Ex-Partner: No    Emotionally Abused: No    Physically Abused: No    Sexually Abused: No    Family History  Problem Relation Age of Onset   Breast cancer Mother        dx 43s   Lung cancer Sister 3     Current Outpatient Medications:    acetaminophen (TYLENOL) 325 MG tablet, Take 650 mg by mouth See admin instructions. Take 2 tablets (650 mg) by mouth 3 times daily scheduled (0800, 1400 & 2000) & take 2 tablets (650 mg) by mouth up to twice daily as needed for pain., Disp: , Rfl:    albuterol (PROVENTIL) (2.5 MG/3ML) 0.083% nebulizer solution, Take 2.5 mg by nebulization every 6 (six) hours as needed for wheezing or shortness of breath., Disp: , Rfl:    albuterol (VENTOLIN HFA) 108 (90  Base) MCG/ACT inhaler, Inhale 2 puffs into the lungs every 6 (six) hours as needed for shortness of breath or wheezing., Disp: , Rfl:    alendronate (FOSAMAX) 70 MG tablet, Take 70 mg by mouth every Monday., Disp: , Rfl:    ammonium lactate (LAC-HYDRIN) 12 % lotion, Apply 1 application. topically daily. (0800) Lower legs., Disp: , Rfl:    apixaban (ELIQUIS) 5 MG TABS tablet, Take 1 tablet (5 mg total) by mouth 2 (two) times daily., Disp: 60 tablet, Rfl:    atorvastatin (LIPITOR) 20 MG tablet, Take 20 mg by mouth daily. (0800), Disp: , Rfl:    bisacodyl (DULCOLAX) 5 MG EC tablet, Take 2 tablets (10 mg total) by mouth daily as needed for moderate constipation., Disp: 30 tablet, Rfl: 0   calcium carbonate (TUMS EX) 750 MG chewable tablet, Chew 1 tablet by mouth in the morning and at bedtime. (0800 & 2000), Disp: , Rfl:    Cholecalciferol (VITAMIN D3) 50 MCG (2000 UT) TABS, Take 2,000 Units by mouth daily. (0800), Disp: , Rfl:    guaiFENesin  (MUCINEX) 600 MG 12 hr tablet, Take 600 mg by mouth 2 (two) times daily as needed (congestion.)., Disp: , Rfl:    INCRUSE ELLIPTA 62.5 MCG/ACT AEPB, Inhale 1 puff into the lungs daily. (0800), Disp: , Rfl:    LUMIGAN 0.01 % SOLN, Place 1 drop into both eyes at bedtime. (2000), Disp: , Rfl:    magnesium oxide (MAG-OX) 400 (240 Mg) MG tablet, Take 0.5 tablets (200 mg total) by mouth daily., Disp: , Rfl:    midodrine (PROAMATINE) 10 MG tablet, Take 1 tablet (10 mg total) by mouth 3 (three) times daily with meals., Disp: , Rfl:    mirtazapine (REMERON) 15 MG tablet, Take 15 mg by mouth at bedtime., Disp: , Rfl:    Multiple Vitamins-Minerals (HEALTHY EYES SUPERVISION 2 PO), Take 1 capsule by mouth in the morning and at bedtime. (0730 & 1730), Disp: , Rfl:    OXYGEN, Inhale 2 L/min into the lungs as needed (shortness of breath OR if pulse ox is <90% on room air)., Disp: , Rfl:    polyethylene glycol (MIRALAX / GLYCOLAX) 17 g packet, Take 17 g by mouth daily., Disp: 14 each, Rfl: 0   potassium chloride SA (KLOR-CON M) 20 MEQ tablet, Take 20 mEq by mouth daily. (0800), Disp: , Rfl:    budesonide (PULMICORT) 0.5 MG/2ML nebulizer solution, Inhale 0.5 mg into the lungs daily. (0800), Disp: , Rfl:    feeding supplement (ENSURE ENLIVE / ENSURE PLUS) LIQD, Take 237 mLs by mouth 2 (two) times daily between meals. (Patient not taking: Reported on 12/12/2022), Disp: 237 mL, Rfl: 12   ondansetron (ZOFRAN) 4 MG tablet, Take 1 tablet (4 mg total) by mouth every 6 (six) hours as needed for nausea. (Patient not taking: Reported on 12/12/2022), Disp: 20 tablet, Rfl: 0 No current facility-administered medications for this visit.  Facility-Administered Medications Ordered in Other Visits:    diphenhydrAMINE (BENADRYL) 50 MG/ML injection, , , ,    famotidine (PEPCID) 20-0.9 MG/50ML-% IVPB, , , ,   Physical exam:  Vitals:   01/03/23 1424  BP: 128/65  Pulse: 73  Resp: 18  Temp: 98.9 F (37.2 C)  TempSrc: Tympanic   SpO2: 91%  Weight: 83 lb (37.6 kg)  Height: 5\' 1"  (1.549 m)   Physical Exam Cardiovascular:     Rate and Rhythm: Normal rate and regular rhythm.     Heart sounds: Normal heart sounds.  Pulmonary:     Effort: Pulmonary effort is normal.     Breath sounds: Normal breath sounds.  Abdominal:     General: Bowel sounds are normal.     Palpations: Abdomen is soft.  Skin:    General: Skin is warm and dry.  Neurological:     Mental Status: She is alert and oriented to person, place, and time.   Breast exam: Patient is s/p right mastectomy without reconstruction and well-healed surgical scar.  She has chronic induration along the right chest wall likely secondary to prior surgery and seroma.  This appears stable overall.  No palpable masses in the left breast     Latest Ref Rng & Units 01/03/2023    2:13 PM  CMP  Glucose 70 - 99 mg/dL 88   BUN 8 - 23 mg/dL 20   Creatinine 8.29 - 1.00 mg/dL 5.62   Sodium 130 - 865 mmol/L 134   Potassium 3.5 - 5.1 mmol/L 3.7   Chloride 98 - 111 mmol/L 100   CO2 22 - 32 mmol/L 25   Calcium 8.9 - 10.3 mg/dL 9.3   Total Protein 6.5 - 8.1 g/dL 6.8   Total Bilirubin 0.3 - 1.2 mg/dL 0.3   Alkaline Phos 38 - 126 U/L 62   AST 15 - 41 U/L 21   ALT 0 - 44 U/L 13       Latest Ref Rng & Units 01/03/2023    2:13 PM  CBC  WBC 4.0 - 10.5 K/uL 10.4   Hemoglobin 12.0 - 15.0 g/dL 78.4   Hematocrit 69.6 - 46.0 % 41.1   Platelets 150 - 400 K/uL 259      Assessment and plan- Patient is a 77 y.o. female who is here for follow-up of following issues:  Newly diagnosed pulmonary embolism in early May 2024: Patient had cute left pulmonary embolism involving the left main pulmonary artery.  She had mechanical thrombectomy and is currently on Eliquis.  PE appears to be unprovoked.  She does have risk factors including chronic immobilization and severe oxygen dependent COPD.  Patient will continue with Eliquis likely indefinitely given her unprovoked nature of pulmonary  embolism.  CT angio chest also did not show any evidence of lung nodules or concern for breast cancer recurrence.  2.  Surveillance breast cancer: She is status postmastectomy adjuvant radiation therapy and adjuvant chemotherapy.  Clinically she is doing well with no concerning signs and symptoms of recurrence based on today's exam.  Left breast mammogram in February 2024 was unremarkable.  I will see her back in 4 months with labs   Visit Diagnosis 1. Encounter for follow-up surveillance of breast cancer   2. Other acute pulmonary embolism without acute cor pulmonale (HCC)      Dr. Owens Shark, MD, MPH [facility] [phone] 01/06/2023 8:29 AM

## 2023-01-12 ENCOUNTER — Ambulatory Visit: Payer: Medicare HMO | Admitting: Radiation Oncology

## 2023-01-18 ENCOUNTER — Encounter: Payer: Self-pay | Admitting: Oncology

## 2023-01-22 ENCOUNTER — Other Ambulatory Visit: Payer: Self-pay

## 2023-01-25 ENCOUNTER — Ambulatory Visit
Admission: RE | Admit: 2023-01-25 | Discharge: 2023-01-25 | Disposition: A | Discharge status: Home / Self Care | Payer: Medicare HMO | Source: Ambulatory Visit | Attending: Radiation Oncology | Admitting: Radiation Oncology

## 2023-01-25 ENCOUNTER — Encounter: Payer: Self-pay | Admitting: Radiation Oncology

## 2023-01-25 VITALS — BP 126/76 | HR 77 | Temp 98.6°F | Resp 16

## 2023-01-25 DIAGNOSIS — R54 Age-related physical debility: Secondary | ICD-10-CM | POA: Insufficient documentation

## 2023-01-25 DIAGNOSIS — Z171 Estrogen receptor negative status [ER-]: Secondary | ICD-10-CM | POA: Diagnosis not present

## 2023-01-25 DIAGNOSIS — Z7901 Long term (current) use of anticoagulants: Secondary | ICD-10-CM | POA: Diagnosis not present

## 2023-01-25 DIAGNOSIS — C50411 Malignant neoplasm of upper-outer quadrant of right female breast: Secondary | ICD-10-CM | POA: Diagnosis present

## 2023-01-25 DIAGNOSIS — Z923 Personal history of irradiation: Secondary | ICD-10-CM | POA: Insufficient documentation

## 2023-01-25 NOTE — Progress Notes (Signed)
Radiation Oncology Follow up Note  Name: Nancy Berg   Date:   01/25/2023 MRN:  [id number] DOB: [birth year]    This 77 y.o. female presents to the clinic today for 18-month follow-up status post ration therapy to her right chest wall and peripheral lymphatics for stage IIIb triple negative carcinoma the right breast status post right mastectomy and adjuvant chemotherapy.  REFERRING PROVIDER: Ellan Lambert, NP  HPI: Patient is a.  77 year old female now out 7 months having completed adjuvant radiation therapy right chest wall and peripheral lymphatics status post mastectomy for stage IIIb triple negative carcinoma.  Seen today in routine follow-up from a breast standpoint she is doing well specifically denies any chest wall tenderness cough or bone pain.  She had an acute pulmonary embolus back in April with bilateral lower extremity Dopplers negative to the embolus involve the left main pulmonary artery with right heart strain.  She underwent a mechanical thrombectomy and is on Eliquis.  COMPLICATIONS OF TREATMENT: none  FOLLOW UP COMPLIANCE: keeps appointments   PHYSICAL EXAM:  BP 126/76   Pulse 77   Temp 98.6 F (37 C) (Tympanic)   Resp 16  Patient is status post mastectomy chest wall is clear without evidence of mass or nodularity.  No axillary or supraclavicular adenopathy is appreciated.  No peripheral edema in her right upper extremity is noted.  Well-developed well-nourished patient in NAD. HEENT reveals PERLA, EOMI, discs not visualized.  Oral cavity is clear. No oral mucosal lesions are identified. Neck is clear without evidence of cervical or supraclavicular adenopathy. Lungs are clear to A&P. Cardiac examination is essentially unremarkable with regular rate and rhythm without murmur rub or thrill. Abdomen is benign with no organomegaly or masses noted. Motor sensory and DTR levels are equal and symmetric in the upper and lower extremities. Cranial nerves II through XII are  grossly intact. Proprioception is intact. No peripheral adenopathy or edema is identified. No motor or sensory levels are noted. Crude visual fields are within normal range.  RADIOLOGY RESULTS: CT scan performed back in April is reviewed compatible with above-stated findings.    PLAN: Present time patient is doing well.  She has no evidence of disease.  Based on her frail and mobility issues I am turning follow-up care over to medical oncology.  I be happy to reevaluate the patient anytime should that be indicated.  Patient is to call with any concerns.  I would like to take this opportunity to thank you for allowing me to participate in the care of your patient.Carmina Miller, MD

## 2023-02-14 ENCOUNTER — Telehealth: Payer: Self-pay | Admitting: Oncology

## 2023-02-14 NOTE — Telephone Encounter (Signed)
Pt son called to cancel appts and let us know that pt passed away on 02/20/23

## 2023-03-02 DEATH — deceased

## 2023-05-08 ENCOUNTER — Ambulatory Visit: Payer: Medicare HMO | Admitting: Oncology

## 2023-05-08 ENCOUNTER — Other Ambulatory Visit: Payer: Medicare HMO

## 2023-07-27 IMAGING — MG MM BREAST LOCALIZATION CLIP
4 series · 4 of 12 positions shown · non-contrast
Comparison: Previous exam(s).

CLINICAL DATA: Assess post biopsy marker clip placement following
ultrasound-guided core needle biopsy of a right breast mass. An
visible mammographically.

EXAM:
3D DIAGNOSTIC RIGHT MAMMOGRAM POST ULTRASOUND BIOPSY

[R ML synth-2D]
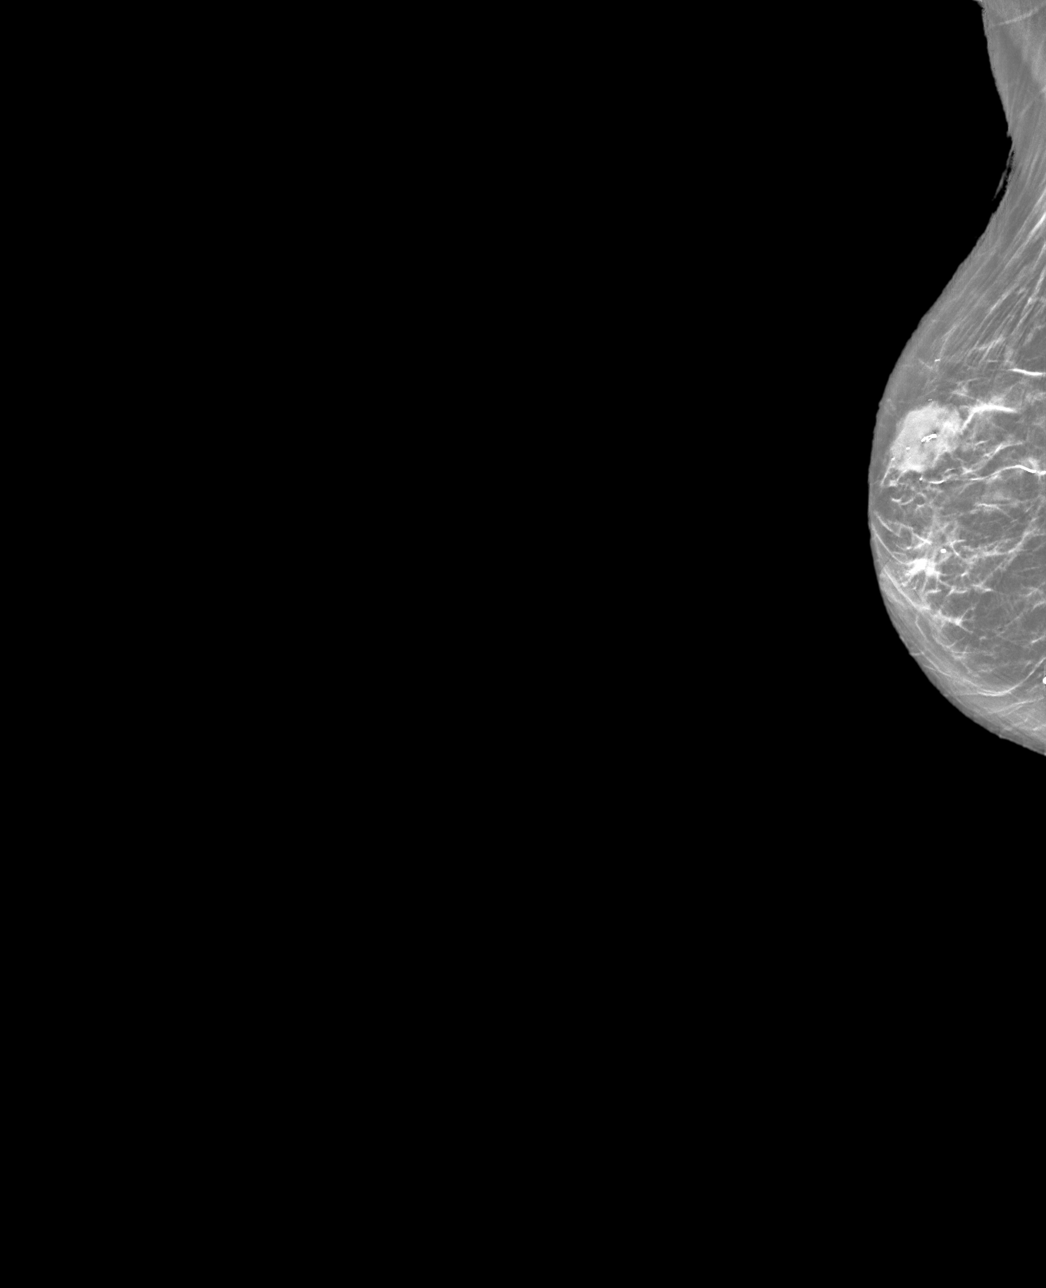

[R CC synth-2D]
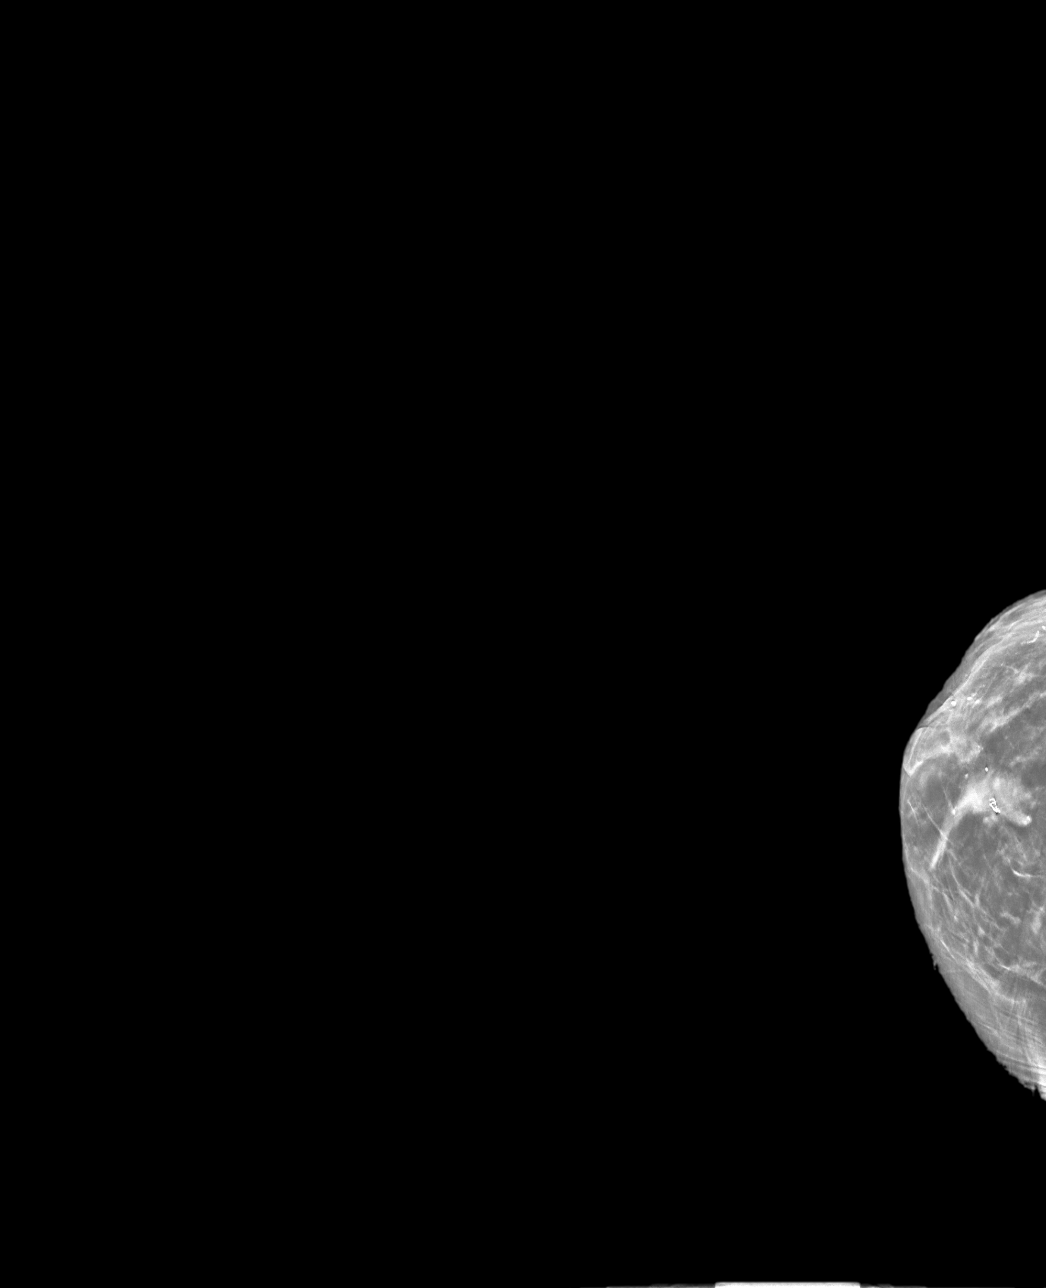

[R CC tomo · tomo slice 29/56.0]
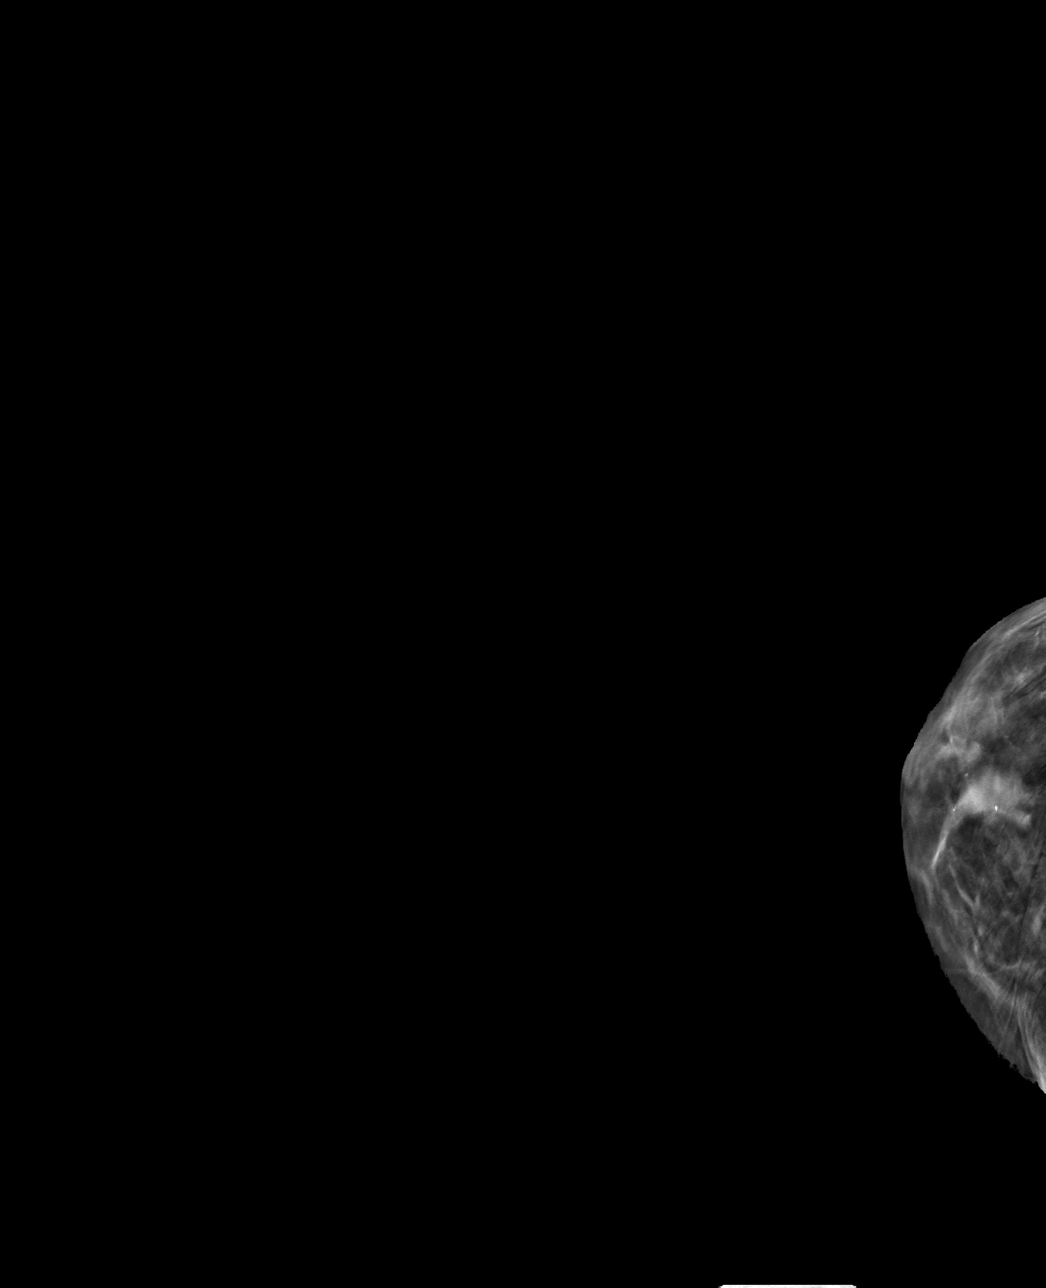

[R ML tomo · tomo slice 28/55.0]
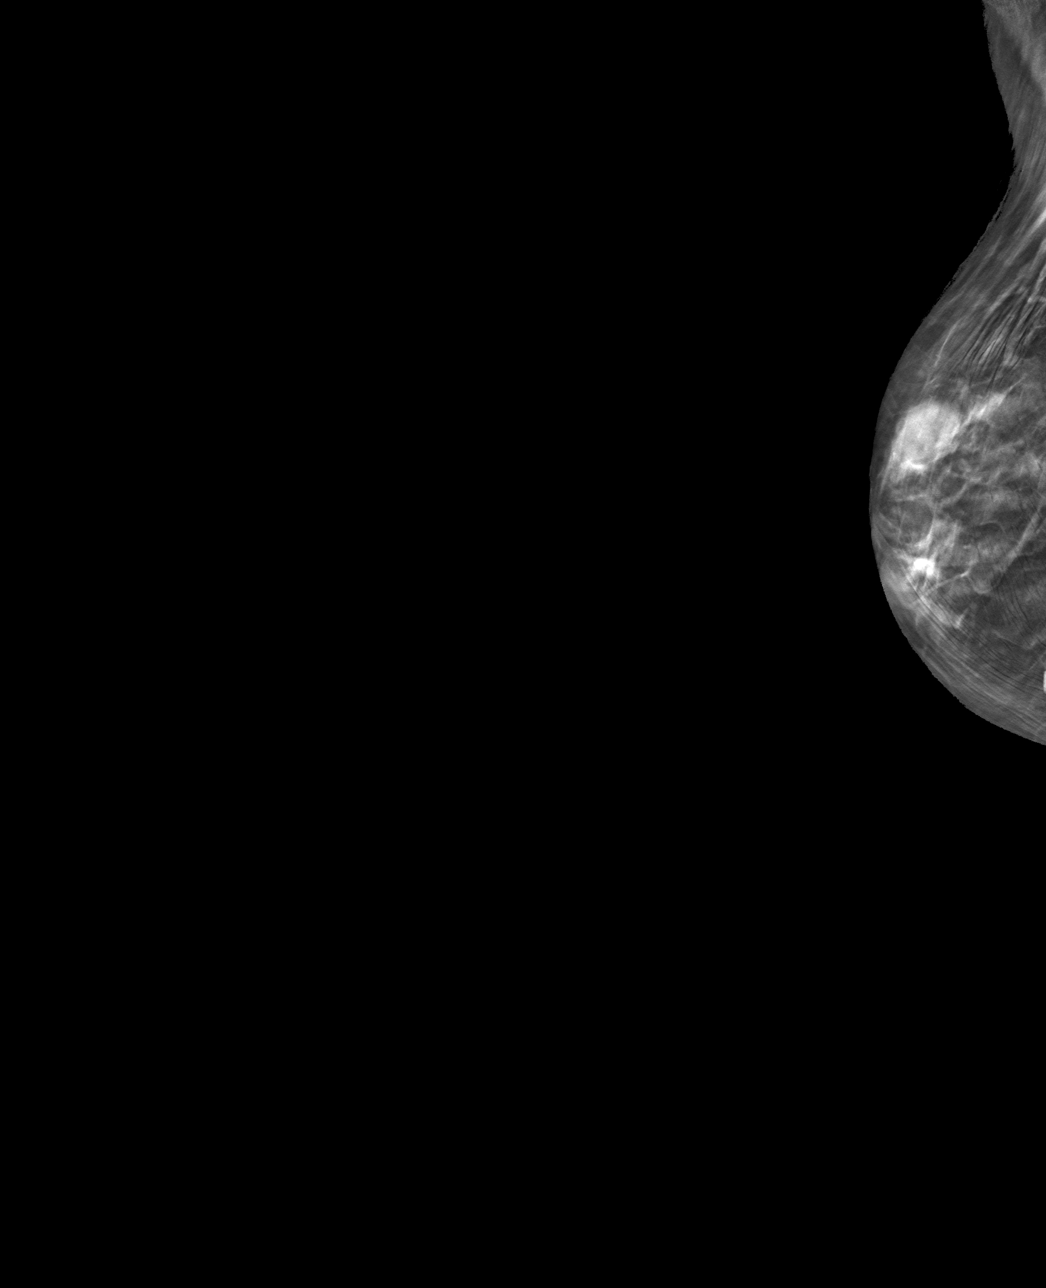

[4 of 12 positions shown; findings below may reference images not displayed]

FINDINGS: 3D Mammographic images were obtained following ultrasound guided
biopsy of . The biopsy marking clip is in expected position at the
site of biopsy.
IMPRESSION: Appropriate positioning of the venous shaped biopsy marking clip at
the site of biopsy in the within the mass in the 12 o'clock position
of the right breast.

Final Assessment: Post Procedure Mammograms for Marker Placement
